# Patient Record
Sex: Female | Born: 1975
Health system: Southern US, Community
[De-identification: ages and names within clinical notes are randomized; demographics above are authoritative.]

## PROBLEM LIST (undated history)

## (undated) DIAGNOSIS — G43909 Migraine, unspecified, not intractable, without status migrainosus: Secondary | ICD-10-CM

## (undated) DIAGNOSIS — K219 Gastro-esophageal reflux disease without esophagitis: Secondary | ICD-10-CM

## (undated) DIAGNOSIS — T8859XA Other complications of anesthesia, initial encounter: Secondary | ICD-10-CM

## (undated) DIAGNOSIS — E079 Disorder of thyroid, unspecified: Secondary | ICD-10-CM

## (undated) DIAGNOSIS — N289 Disorder of kidney and ureter, unspecified: Secondary | ICD-10-CM

## (undated) DIAGNOSIS — M779 Enthesopathy, unspecified: Secondary | ICD-10-CM

## (undated) DIAGNOSIS — D649 Anemia, unspecified: Secondary | ICD-10-CM

## (undated) DIAGNOSIS — T4145XA Adverse effect of unspecified anesthetic, initial encounter: Secondary | ICD-10-CM

## (undated) DIAGNOSIS — F32A Depression, unspecified: Secondary | ICD-10-CM

## (undated) DIAGNOSIS — I10 Essential (primary) hypertension: Secondary | ICD-10-CM

## (undated) DIAGNOSIS — E039 Hypothyroidism, unspecified: Secondary | ICD-10-CM

## (undated) DIAGNOSIS — G4730 Sleep apnea, unspecified: Secondary | ICD-10-CM

## (undated) HISTORY — PX: CHOLECYSTECTOMY: SHX55

## (undated) HISTORY — PX: DILATION AND CURETTAGE OF UTERUS: SHX78

---

## 2007-02-09 ENCOUNTER — Emergency Department (HOSPITAL_COMMUNITY): Admission: EM | Admit: 2007-02-09 | Discharge: 2007-02-09 | Payer: Self-pay | Admitting: Emergency Medicine

## 2007-08-23 ENCOUNTER — Ambulatory Visit (HOSPITAL_COMMUNITY): Admission: RE | Admit: 2007-08-23 | Discharge: 2007-08-23 | Payer: Self-pay | Admitting: Obstetrics and Gynecology

## 2010-08-09 ENCOUNTER — Emergency Department (HOSPITAL_BASED_OUTPATIENT_CLINIC_OR_DEPARTMENT_OTHER)
Admission: EM | Admit: 2010-08-09 | Discharge: 2010-08-09 | Payer: Self-pay | Source: Home / Self Care | Admitting: Emergency Medicine

## 2010-09-08 ENCOUNTER — Emergency Department (HOSPITAL_BASED_OUTPATIENT_CLINIC_OR_DEPARTMENT_OTHER)
Admission: EM | Admit: 2010-09-08 | Discharge: 2010-09-08 | Disposition: A | Discharge status: Home / Self Care | Payer: Medicaid Other | Attending: Emergency Medicine | Admitting: Emergency Medicine

## 2010-09-08 ENCOUNTER — Emergency Department (INDEPENDENT_AMBULATORY_CARE_PROVIDER_SITE_OTHER): Payer: Medicaid Other

## 2010-09-08 DIAGNOSIS — R0602 Shortness of breath: Secondary | ICD-10-CM | POA: Insufficient documentation

## 2010-09-08 DIAGNOSIS — J811 Chronic pulmonary edema: Secondary | ICD-10-CM | POA: Insufficient documentation

## 2010-09-08 DIAGNOSIS — K219 Gastro-esophageal reflux disease without esophagitis: Secondary | ICD-10-CM | POA: Insufficient documentation

## 2010-09-08 DIAGNOSIS — I10 Essential (primary) hypertension: Secondary | ICD-10-CM

## 2010-09-08 DIAGNOSIS — J45909 Unspecified asthma, uncomplicated: Secondary | ICD-10-CM | POA: Insufficient documentation

## 2010-09-08 DIAGNOSIS — R05 Cough: Secondary | ICD-10-CM

## 2010-09-08 LAB — CBC
Hemoglobin: 13.1 g/dL (ref 12.0–15.0)
MCH: 29 pg (ref 26.0–34.0)
RBC: 4.52 MIL/uL (ref 3.87–5.11)

## 2010-09-08 LAB — POCT CARDIAC MARKERS
CKMB, poc: 1.7 ng/mL (ref 1.0–8.0)
Myoglobin, poc: 145 ng/mL (ref 12–200)
Troponin i, poc: <0.05 ng/mL (ref 0.00–0.09)

## 2010-09-08 LAB — DIFFERENTIAL
Basophils Relative: 0 % (ref 0–1)
Monocytes Relative: 4 % (ref 3–12)
Neutro Abs: 5.5 10*3/uL (ref 1.7–7.7)
Neutrophils Relative %: 63 % (ref 43–77)

## 2010-09-08 LAB — BASIC METABOLIC PANEL
CO2: 24 mEq/L (ref 19–32)
Glucose, Bld: 98 mg/dL (ref 70–99)
Potassium: 3.6 mEq/L (ref 3.5–5.1)
Sodium: 148 mEq/L — ABNORMAL HIGH (ref 135–145)

## 2010-09-28 ENCOUNTER — Emergency Department (INDEPENDENT_AMBULATORY_CARE_PROVIDER_SITE_OTHER): Payer: Medicaid Other

## 2010-09-28 ENCOUNTER — Emergency Department (HOSPITAL_BASED_OUTPATIENT_CLINIC_OR_DEPARTMENT_OTHER)
Admission: EM | Admit: 2010-09-28 | Discharge: 2010-09-28 | Disposition: A | Discharge status: Home / Self Care | Payer: Medicaid Other | Attending: Emergency Medicine | Admitting: Emergency Medicine

## 2010-09-28 DIAGNOSIS — J45909 Unspecified asthma, uncomplicated: Secondary | ICD-10-CM | POA: Insufficient documentation

## 2010-09-28 DIAGNOSIS — I10 Essential (primary) hypertension: Secondary | ICD-10-CM | POA: Insufficient documentation

## 2010-09-28 DIAGNOSIS — S139XXA Sprain of joints and ligaments of unspecified parts of neck, initial encounter: Secondary | ICD-10-CM | POA: Insufficient documentation

## 2010-09-28 DIAGNOSIS — M542 Cervicalgia: Secondary | ICD-10-CM

## 2010-09-28 DIAGNOSIS — Z79899 Other long term (current) drug therapy: Secondary | ICD-10-CM | POA: Insufficient documentation

## 2010-09-28 DIAGNOSIS — K219 Gastro-esophageal reflux disease without esophagitis: Secondary | ICD-10-CM | POA: Insufficient documentation

## 2010-09-28 DIAGNOSIS — Y929 Unspecified place or not applicable: Secondary | ICD-10-CM | POA: Insufficient documentation

## 2011-01-24 ENCOUNTER — Emergency Department (HOSPITAL_BASED_OUTPATIENT_CLINIC_OR_DEPARTMENT_OTHER)
Admission: EM | Admit: 2011-01-24 | Discharge: 2011-01-24 | Disposition: A | Discharge status: Home / Self Care | Payer: Medicaid Other | Attending: Emergency Medicine | Admitting: Emergency Medicine

## 2011-01-24 DIAGNOSIS — I10 Essential (primary) hypertension: Secondary | ICD-10-CM | POA: Insufficient documentation

## 2011-01-24 DIAGNOSIS — K219 Gastro-esophageal reflux disease without esophagitis: Secondary | ICD-10-CM | POA: Insufficient documentation

## 2011-01-24 DIAGNOSIS — K644 Residual hemorrhoidal skin tags: Secondary | ICD-10-CM | POA: Insufficient documentation

## 2011-01-24 DIAGNOSIS — J45909 Unspecified asthma, uncomplicated: Secondary | ICD-10-CM | POA: Insufficient documentation

## 2011-05-06 ENCOUNTER — Emergency Department (INDEPENDENT_AMBULATORY_CARE_PROVIDER_SITE_OTHER): Payer: Medicaid Other

## 2011-05-06 ENCOUNTER — Emergency Department (HOSPITAL_BASED_OUTPATIENT_CLINIC_OR_DEPARTMENT_OTHER)
Admission: EM | Admit: 2011-05-06 | Discharge: 2011-05-06 | Disposition: A | Discharge status: Home / Self Care | Payer: Medicaid Other | Attending: Emergency Medicine | Admitting: Emergency Medicine

## 2011-05-06 ENCOUNTER — Encounter: Payer: Self-pay | Admitting: Family Medicine

## 2011-05-06 ENCOUNTER — Other Ambulatory Visit: Payer: Self-pay

## 2011-05-06 ENCOUNTER — Emergency Department (HOSPITAL_BASED_OUTPATIENT_CLINIC_OR_DEPARTMENT_OTHER): Payer: Medicaid Other

## 2011-05-06 DIAGNOSIS — I517 Cardiomegaly: Secondary | ICD-10-CM | POA: Insufficient documentation

## 2011-05-06 DIAGNOSIS — Z79899 Other long term (current) drug therapy: Secondary | ICD-10-CM | POA: Insufficient documentation

## 2011-05-06 DIAGNOSIS — R0602 Shortness of breath: Secondary | ICD-10-CM

## 2011-05-06 DIAGNOSIS — Z8739 Personal history of other diseases of the musculoskeletal system and connective tissue: Secondary | ICD-10-CM | POA: Insufficient documentation

## 2011-05-06 DIAGNOSIS — R05 Cough: Secondary | ICD-10-CM

## 2011-05-06 DIAGNOSIS — J45909 Unspecified asthma, uncomplicated: Secondary | ICD-10-CM | POA: Insufficient documentation

## 2011-05-06 DIAGNOSIS — I10 Essential (primary) hypertension: Secondary | ICD-10-CM | POA: Insufficient documentation

## 2011-05-06 HISTORY — DX: Essential (primary) hypertension: I10

## 2011-05-06 HISTORY — DX: Migraine, unspecified, not intractable, without status migrainosus: G43.909

## 2011-05-06 LAB — DIFFERENTIAL
Basophils Absolute: 0 10*3/uL (ref 0.0–0.1)
Basophils Relative: 0 % (ref 0–1)
Neutro Abs: 6.3 10*3/uL (ref 1.7–7.7)
Neutrophils Relative %: 69 % (ref 43–77)

## 2011-05-06 LAB — CBC
MCHC: 32.4 g/dL (ref 30.0–36.0)
RDW: 14.4 % (ref 11.5–15.5)

## 2011-05-06 LAB — BASIC METABOLIC PANEL
BUN: 25 mg/dL — ABNORMAL HIGH (ref 6–23)
Calcium: 8.9 mg/dL (ref 8.4–10.5)
GFR calc non Af Amer: 29 mL/min — ABNORMAL LOW (ref >90)
Glucose, Bld: 122 mg/dL — ABNORMAL HIGH (ref 70–99)
Potassium: 3.9 mEq/L (ref 3.5–5.1)

## 2011-05-06 LAB — PREGNANCY, URINE: Preg Test, Ur: NEGATIVE

## 2011-05-06 LAB — CARDIAC PANEL(CRET KIN+CKTOT+MB+TROPI)
CK, MB: 2.2 ng/mL (ref 0.3–4.0)
Total CK: 172 U/L (ref 7–177)

## 2011-05-06 MED ORDER — ALBUTEROL SULFATE (5 MG/ML) 0.5% IN NEBU
INHALATION_SOLUTION | RESPIRATORY_TRACT | Status: AC
  Administered 2011-05-06: 2.5 mg via RESPIRATORY_TRACT
  Filled 2011-05-06: qty 0.5

## 2011-05-06 MED ORDER — IPRATROPIUM BROMIDE 0.02 % IN SOLN
0.5000 mg | RESPIRATORY_TRACT | Status: DC
  Administered 2011-05-06: 0.5 mg via RESPIRATORY_TRACT

## 2011-05-06 MED ORDER — IPRATROPIUM BROMIDE 0.02 % IN SOLN
RESPIRATORY_TRACT | Status: AC
  Administered 2011-05-06: 0.5 mg via RESPIRATORY_TRACT
  Filled 2011-05-06: qty 2.5

## 2011-05-06 MED ORDER — HYDRALAZINE HCL 20 MG/ML IJ SOLN
10.0000 mg | Freq: Once | INTRAMUSCULAR | Status: AC
  Administered 2011-05-06: 10 mg via INTRAVENOUS
  Filled 2011-05-06: qty 1

## 2011-05-06 MED ORDER — PREDNISONE 50 MG PO TABS: 50.0000 mg | ORAL_TABLET | Freq: Every day | ORAL | Status: AC

## 2011-05-06 MED ORDER — ALBUTEROL SULFATE (5 MG/ML) 0.5% IN NEBU
2.5000 mg | INHALATION_SOLUTION | RESPIRATORY_TRACT | Status: DC
  Administered 2011-05-06: 2.5 mg via RESPIRATORY_TRACT

## 2011-05-06 MED ORDER — HYDRALAZINE HCL 20 MG/ML IJ SOLN
20.0000 mg | Freq: Once | INTRAMUSCULAR | Status: AC
  Administered 2011-05-06: 20 mg via INTRAVENOUS
  Filled 2011-05-06: qty 1

## 2011-05-06 MED ORDER — AZITHROMYCIN 250 MG PO TABS: 250.0000 mg | ORAL_TABLET | Freq: Every day | ORAL | Status: AC

## 2011-05-06 NOTE — ED Provider Notes (Addendum)
History     CSN: [id number] Arrival date & time: 05/06/2011 10:45 AM  Chief Complaint  Patient presents with  . Shortness of Breath     HPI 35 year old female history of hypertension, asthma presents with shortness of breath palpitations. Patient states that she has experienced shortness of breath with exertion x6 days. Progressively worsening. States that she has been intermittently wheezing throughout the week and using her albuterol inhaler more than usual. Complaint of subjective fever no chills. No URI symptoms. Intermittent nonproductive cough. Denies abdominal pain, nausea, vomiting. She denies chest pain but states that she's had palpitations over the past week as well. No leg swelling or edema. She has a family history of VET in her mother and an aunt. Unknown cause. No history of blood clots in herself. No history of cancer exogenous estrogen use long trips travel hospitalization or surgery. States that she has been compliant with her blood pressure medications except hyzaar which she ran out of 3 days ago. States this is a typical blood pressure for her when she is out of any of her antihypertensives.  Past Medical History  Diagnosis Date  . Arthritis   . Hypertension   . Migraines     Past Surgical History  Procedure Date  . Cesarean section   . Cholecystectomy   . Dilation and curettage of uterus     No family history on file.  History  Substance Use Topics  . Smoking status: Never Smoker   . Smokeless tobacco: Not on file  . Alcohol Use: Yes    OB History    Grav Para Term Preterm Abortions TAB SAB Ect Mult Living                  Review of Systems Negative except as noted in history of present illness  Allergies  Maxalt and Penicillins  Home Medications   Current Outpatient Rx  Name Route Sig Dispense Refill  . ALBUTEROL SULFATE HFA 108 (90 BASE) MCG/ACT IN AERS Inhalation Inhale 2 puffs into the lungs every 6 (six) hours as needed.      Marland Kitchen CLONIDINE  HCL 0.2 MG/24HR TD PTWK Transdermal Place 1 patch onto the skin once a week.      Marland Kitchen CAMBIA PO Oral Take by mouth.      Marland Kitchen LOSARTAN POTASSIUM-HCTZ 100-12.5 MG PO TABS Oral Take 1 tablet by mouth daily.      . NEBIVOLOL HCL 10 MG PO TABS Oral Take 20 mg by mouth daily.        BP 226/128  Pulse 73  Temp(Src) 98.5 F (36.9 C) (Oral)  Resp 18  Ht 5' 6.5" (1.689 m)  Wt 348 lb (157.852 kg)  BMI 55.33 kg/m2  SpO2 100%  LMP 04/25/2011  Physical Exam  Nursing note and vitals reviewed. Constitutional: She is oriented to person, place, and time. She appears well-developed.  HENT:  Head: Atraumatic.  Mouth/Throat: Oropharynx is clear and moist.  Eyes: Conjunctivae and EOM are normal. Pupils are equal, round, and reactive to light.  Neck: Normal range of motion. Neck supple.  Cardiovascular: Normal rate, regular rhythm, normal heart sounds and intact distal pulses.   Pulmonary/Chest: Effort normal and breath sounds normal. No respiratory distress. She has no wheezes. She has no rales.  Abdominal: Soft. She exhibits no distension. There is no tenderness. There is no rebound and no guarding.  Musculoskeletal: Normal range of motion. She exhibits no edema and no tenderness.  Neurological: She is alert and  oriented to person, place, and time.  Skin: Skin is warm and dry. No rash noted.  Psychiatric: She has a normal mood and affect.    ED Course  Procedures (including critical care time)  Labs Reviewed  D-DIMER, QUANTITATIVE - Abnormal; Notable for the following:    D-Dimer, Quant 1.57 (*)    All other components within normal limits  PREGNANCY, URINE  CBC  DIFFERENTIAL  PRO B NATRIURETIC PEPTIDE  CARDIAC PANEL(CRET KIN+CKTOT+MB+TROPI)  BASIC METABOLIC PANEL   Dg Chest 2 View  05/06/2011  *RADIOLOGY REPORT*  Clinical Data: Shortness of breath, cough, history of asthma  CHEST - 2 VIEW  Comparison: Chest x-ray of 09/08/2010  Findings: Moderate cardiomegaly remains.  Minimal fullness of  the perihilar vasculature is present which may indicate fluid overload. No effusion is seen.  No acute bony abnormality.  IMPRESSION: There is cardiomegaly.  Question of mild fluid overload.  Original Report Authenticated By: Joretta Bachelor, M.D.     No diagnosis found.   Date: 05/06/2011  Rate: 69  Rhythm: normal sinus rhythm  QRS Axis: left  Intervals: normal  ST/T Wave abnormalities: nonspecific T wave changes  Conduction Disutrbances:none  Narrative Interpretation:   Old EKG Reviewed: changes noted  MDM  35 year old female history of asthma presents with shortness of breath, palpitations. She does have a family history of VTE. She is not wheezing today and has good air movement but still feels short of breath. We'll check basic labs, BMP, cardiac enzymes given her elevated blood pressure. Check d-dimer. She does not feel like she needs a nebulizer treatment at this time. Reassess. Recheck BP  Hendricks Milo, MD  12:18 PM  Labs reviewed and remarkable for elevated d-dimer. CT PE ordered  12:31 PM  Unable to perform CT 2/2 renal insufficiency 2.1 (last 1.9)  1:12 PM  Discussed with patient. She is feeling better after a nebulizer treatment and feels back to baseline. Discussed transfer for V/Q scan and further w/u for shortness of breath. Pt states she's feeling better. Refusing admit/transfer. Understands that we cannot r/o PE and risks of leaving up to and including death. Will attempt control of blood pressure and reassess. Hydralazine ordered.  2:36 PM  Pt continues to remain asx after one duoneb. States that she feels better. BP slightly improved from previous   BP 181/100  Pulse 77  Temp(Src) 98.5 F (36.9 C) (Oral)  Resp 18  Ht 5' 6.5" (1.689 m)  Wt 348 lb (157.852 kg)  BMI 55.33 kg/m2  SpO2 100%  LMP 04/25/2011  190/97 on repeat and pt states this is her baseline blood pressure.  States she does have refills on her antihypertensives. Will prescribe prednisone and  zpack given subj fever at home. Given strict precautions for return as she is still refusing admit. She is competent and understands the risks. Will not make her sign out AMA today.  Hendricks Milo, MD     Blair Heys, MD 05/06/11 [place], MD 05/06/11 [phone]

## 2011-05-06 NOTE — ED Notes (Signed)
Pt c/o shob with exertion. Pt also c/o non-productive cough x 5 days. Pt speaking in full sentences, nad noted.

## 2011-07-03 ENCOUNTER — Emergency Department (INDEPENDENT_AMBULATORY_CARE_PROVIDER_SITE_OTHER): Payer: Medicaid Other

## 2011-07-03 ENCOUNTER — Emergency Department (HOSPITAL_BASED_OUTPATIENT_CLINIC_OR_DEPARTMENT_OTHER)
Admission: EM | Admit: 2011-07-03 | Discharge: 2011-07-03 | Disposition: A | Discharge status: Home / Self Care | Payer: Medicaid Other | Attending: Emergency Medicine | Admitting: Emergency Medicine

## 2011-07-03 ENCOUNTER — Encounter (HOSPITAL_BASED_OUTPATIENT_CLINIC_OR_DEPARTMENT_OTHER): Payer: Self-pay | Admitting: *Deleted

## 2011-07-03 DIAGNOSIS — R05 Cough: Secondary | ICD-10-CM | POA: Insufficient documentation

## 2011-07-03 DIAGNOSIS — J45909 Unspecified asthma, uncomplicated: Secondary | ICD-10-CM | POA: Insufficient documentation

## 2011-07-03 DIAGNOSIS — R059 Cough, unspecified: Secondary | ICD-10-CM | POA: Insufficient documentation

## 2011-07-03 DIAGNOSIS — H9209 Otalgia, unspecified ear: Secondary | ICD-10-CM | POA: Insufficient documentation

## 2011-07-03 DIAGNOSIS — R0602 Shortness of breath: Secondary | ICD-10-CM

## 2011-07-03 DIAGNOSIS — J3489 Other specified disorders of nose and nasal sinuses: Secondary | ICD-10-CM | POA: Insufficient documentation

## 2011-07-03 DIAGNOSIS — I10 Essential (primary) hypertension: Secondary | ICD-10-CM | POA: Insufficient documentation

## 2011-07-03 DIAGNOSIS — Z79899 Other long term (current) drug therapy: Secondary | ICD-10-CM | POA: Insufficient documentation

## 2011-07-03 DIAGNOSIS — J9819 Other pulmonary collapse: Secondary | ICD-10-CM

## 2011-07-03 DIAGNOSIS — R0989 Other specified symptoms and signs involving the circulatory and respiratory systems: Secondary | ICD-10-CM

## 2011-07-03 HISTORY — DX: Enthesopathy, unspecified: M77.9

## 2011-07-03 MED ORDER — ACETAMINOPHEN 325 MG PO TABS
650.0000 mg | ORAL_TABLET | Freq: Once | ORAL | Status: AC
  Administered 2011-07-03: 650 mg via ORAL
  Filled 2011-07-03: qty 2

## 2011-07-03 MED ORDER — AZITHROMYCIN 250 MG PO TABS: 250.0000 mg | ORAL_TABLET | Freq: Every day | ORAL | Status: AC

## 2011-07-03 MED ORDER — ONDANSETRON 4 MG PO TBDP
4.0000 mg | ORAL_TABLET | Freq: Once | ORAL | Status: AC
  Administered 2011-07-03: 4 mg via ORAL
  Filled 2011-07-03: qty 1

## 2011-07-03 NOTE — ED Notes (Signed)
Pt c/o cough non-productive, congestion and bilateral ear pain since yesterday

## 2011-07-03 NOTE — ED Provider Notes (Signed)
History     CSN: [id number] Arrival date & time: 07/03/2011  7:05 PM   First MD Initiated Contact with Patient 07/03/11 1937      Chief Complaint  Patient presents with  . Cough  . Nasal Congestion  . Otalgia    (Consider location/radiation/quality/duration/timing/severity/associated sxs/prior treatment) Patient is a 35 y.o. female presenting with cough and ear pain. The history is provided by the patient. No language interpreter was used.  Cough This is a new problem. The current episode started 1 to 2 hours ago. The problem occurs constantly. The problem has not changed since onset.The cough is productive of sputum. The maximum temperature recorded prior to her arrival was 102 to 102.9 F. The fever has been present for less than 1 day. Associated symptoms include ear pain. She has tried decongestants for the symptoms. She is not a smoker.  Otalgia Associated symptoms include cough.    Past Medical History  Diagnosis Date  . Hypertension   . Migraines   . Tendonitis   . Asthma     Past Surgical History  Procedure Date  . Cesarean section   . Cholecystectomy   . Dilation and curettage of uterus     History reviewed. No pertinent family history.  History  Substance Use Topics  . Smoking status: Never Smoker   . Smokeless tobacco: Not on file  . Alcohol Use: No    OB History    Grav Para Term Preterm Abortions TAB SAB Ect Mult Living                  Review of Systems  HENT: Positive for ear pain.   Respiratory: Positive for cough.   All other systems reviewed and are negative.    Allergies  Maxalt and Penicillins  Home Medications   Current Outpatient Rx  Name Route Sig Dispense Refill  . ALBUTEROL SULFATE HFA 108 (90 BASE) MCG/ACT IN AERS Inhalation Inhale 2 puffs into the lungs every 6 (six) hours as needed. For shortness of breath    . CLONIDINE HCL 0.2 MG/24HR TD PTWK Transdermal Place 1 patch onto the skin once a week.      Marland Kitchen ADVAIR DISKUS IN  Inhalation Inhale 1 puff into the lungs 2 (two) times daily.      Marland Kitchen LOSARTAN POTASSIUM-HCTZ 100-12.5 MG PO TABS Oral Take 1 tablet by mouth daily.      . NEBIVOLOL HCL 10 MG PO TABS Oral Take 20 mg by mouth daily.      Marland Kitchen OVER THE COUNTER MEDICATION Oral Take 30 mLs by mouth every 4 (four) hours as needed. Children's multi symptom cold medicine       BP 202/91  Pulse 85  Temp(Src) 99.9 F (37.7 C) (Oral)  Resp 19  SpO2 96%  LMP 06/26/2011  Physical Exam  Nursing note and vitals reviewed. Constitutional: She appears well-developed and well-nourished.  HENT:  Head: Normocephalic.  Right Ear: External ear normal.  Left Ear: External ear normal.       rhinnorea  Cardiovascular: Normal rate and regular rhythm.   Pulmonary/Chest: Effort normal and breath sounds normal.    ED Course  Procedures (including critical care time)  Labs Reviewed - No data to display Dg Chest 2 View  07/03/2011  *RADIOLOGY REPORT*  Clinical Data: Shortness of breath and congestion.  CHEST - 2 VIEW  Comparison: Chest x-ray 05/06/2011.  Findings: The heart is enlarged but stable.  There is moderate vascular congestion but no overt  pulmonary edema.  There is streaky bibasilar atelectasis, left greater than right but no definite infiltrates or effusions.  The bony thorax is intact.  IMPRESSION: Streaky bibasilar atelectasis and vascular congestion.  Original Report Authenticated By: P. Kalman Jewels, M.D.     No diagnosis found.    MDM  Pt didn't take her blood pressure medications today and has been taking cold medication discuss with the pt the importance of meds and taking the right cold medication:pt has no overt lower extremity edema        Glendell Docker, NP 07/04/11 0003

## 2011-07-04 NOTE — ED Provider Notes (Signed)
Medical screening examination/treatment/procedure(s) were performed by non-physician practitioner and as supervising physician I was immediately available for consultation/collaboration.  Chauncy Passy, MD 07/04/11 [phone]

## 2012-01-08 ENCOUNTER — Encounter (HOSPITAL_BASED_OUTPATIENT_CLINIC_OR_DEPARTMENT_OTHER): Payer: Self-pay | Admitting: *Deleted

## 2012-01-08 ENCOUNTER — Emergency Department (HOSPITAL_BASED_OUTPATIENT_CLINIC_OR_DEPARTMENT_OTHER): Payer: Medicaid Other

## 2012-01-08 ENCOUNTER — Emergency Department (HOSPITAL_BASED_OUTPATIENT_CLINIC_OR_DEPARTMENT_OTHER)
Admission: EM | Admit: 2012-01-08 | Discharge: 2012-01-08 | Disposition: A | Discharge status: Home / Self Care | Payer: Medicaid Other | Attending: Emergency Medicine | Admitting: Emergency Medicine

## 2012-01-08 DIAGNOSIS — S93401A Sprain of unspecified ligament of right ankle, initial encounter: Secondary | ICD-10-CM

## 2012-01-08 DIAGNOSIS — I10 Essential (primary) hypertension: Secondary | ICD-10-CM | POA: Insufficient documentation

## 2012-01-08 DIAGNOSIS — J45909 Unspecified asthma, uncomplicated: Secondary | ICD-10-CM | POA: Insufficient documentation

## 2012-01-08 DIAGNOSIS — Y92009 Unspecified place in unspecified non-institutional (private) residence as the place of occurrence of the external cause: Secondary | ICD-10-CM | POA: Insufficient documentation

## 2012-01-08 DIAGNOSIS — X500XXA Overexertion from strenuous movement or load, initial encounter: Secondary | ICD-10-CM | POA: Insufficient documentation

## 2012-01-08 DIAGNOSIS — S93409A Sprain of unspecified ligament of unspecified ankle, initial encounter: Secondary | ICD-10-CM | POA: Insufficient documentation

## 2012-01-08 DIAGNOSIS — Z79899 Other long term (current) drug therapy: Secondary | ICD-10-CM | POA: Insufficient documentation

## 2012-01-08 MED ORDER — HYDROCODONE-ACETAMINOPHEN 5-325 MG PO TABS: 2.0000 | ORAL_TABLET | ORAL | Status: DC | PRN

## 2012-01-08 NOTE — Discharge Instructions (Signed)
Ankle Sprain An ankle sprain is an injury to the strong, fibrous tissues (ligaments) that hold the bones of your ankle joint together.  CAUSES Ankle sprain usually is caused by a fall or by twisting your ankle. People who participate in sports are more prone to these types of injuries.  SYMPTOMS  Symptoms of ankle sprain include:  Pain in your ankle. The pain may be present at rest or only when you are trying to stand or walk.   Swelling.   Bruising. Bruising may develop immediately or within 1 to 2 days after your injury.   Difficulty standing or walking.  DIAGNOSIS  Your caregiver will ask you details about your injury and perform a physical exam of your ankle to determine if you have an ankle sprain. During the physical exam, your caregiver will press and squeeze specific areas of your foot and ankle. Your caregiver will try to move your ankle in certain ways. An X-ray exam may be done to be sure a bone was not broken or a ligament did not separate from one of the bones in your ankle (avulsion).  TREATMENT  Certain types of braces can help stabilize your ankle. Your caregiver can make a recommendation for this. Your caregiver may recommend the use of medication for pain. If your sprain is severe, your caregiver may refer you to a surgeon who helps to restore function to parts of your skeletal system (orthopedist) or a physical therapist. HOME CARE INSTRUCTIONS  Apply ice to your injury for 1 to 2 days or as directed by your caregiver. Applying ice helps to reduce inflammation and pain.  Put ice in a plastic bag.   Place a towel between your skin and the bag.   Leave the ice on for 15 to 20 minutes at a time, every 2 hours while you are awake.   Take over-the-counter or prescription medicines for pain, discomfort, or fever only as directed by your caregiver.   Keep your injured leg elevated, when possible, to lessen swelling.   If your caregiver recommends crutches, use them as  instructed. Gradually, put weight on the affected ankle. Continue to use crutches or a cane until you can walk without feeling pain in your ankle.   If you have a plaster splint, wear the splint as directed by your caregiver. Do not rest it on anything harder than a pillow the first 24 hours. Do not put weight on it. Do not get it wet. You may take it off to take a shower or bath.   You may have been given an elastic bandage to wear around your ankle to provide support. If the elastic bandage is too tight (you have numbness or tingling in your foot or your foot becomes cold and blue), adjust the bandage to make it comfortable.   If you have an air splint, you may blow more air into it or let air out to make it more comfortable. You may take your splint off at night and before taking a shower or bath.   Wiggle your toes in the splint several times per day if you are able.  SEEK MEDICAL CARE IF:   You have an increase in bruising, swelling, or pain.   Your toes feel cold.   Pain relief is not achieved with medication.  SEEK IMMEDIATE MEDICAL CARE IF: Your toes are numb or blue or you have severe pain. MAKE SURE YOU:   Understand these instructions.   Will watch your condition.     Will get help right away if you are not doing well or get worse.  Document Released: 07/18/2005 Document Revised: 07/07/2011 Document Reviewed: 02/20/2008 [employer] Patient Information 2012 [employer]. 

## 2012-01-08 NOTE — ED Notes (Signed)
Pt reports that she began having right ankle pain last PM denies known injury pt states that pain goes up into her hip

## 2012-01-08 NOTE — ED Provider Notes (Signed)
History     CSN: [id number]  Arrival date & time 01/08/12  1932   First MD Initiated Contact with Patient 01/08/12 2118      Chief Complaint  Patient presents with  . Ankle Pain    (Consider location/radiation/quality/duration/timing/severity/associated sxs/prior treatment) Patient is a 36 y.o. female presenting with ankle pain. The history is provided by the patient. No language interpreter was used.  Ankle Pain  The incident occurred yesterday. The incident occurred at home. The injury mechanism was torsion. The pain is present in the right ankle. The quality of the pain is described as aching. The pain is at a severity of 4/10. Associated symptoms include inability to bear weight. Pertinent negatives include no numbness. She has tried nothing for the symptoms. The treatment provided moderate relief.   patient complains of soreness to right ankle. Patient reports she slid on the floor at work yesterday patient did not realize she had injured her ankle. Patient complains of swelling and pain she has pain with attempting to walk  Past Medical History  Diagnosis Date  . Hypertension   . Migraines   . Tendonitis   . Asthma     Past Surgical History  Procedure Date  . Cesarean section   . Cholecystectomy   . Dilation and curettage of uterus     History reviewed. No pertinent family history.  History  Substance Use Topics  . Smoking status: Never Smoker   . Smokeless tobacco: Not on file  . Alcohol Use: No    OB History    Grav Para Term Preterm Abortions TAB SAB Ect Mult Living                  Review of Systems  Neurological: Negative for numbness.  All other systems reviewed and are negative.    Allergies  Maxalt and Penicillins  Home Medications   Current Outpatient Rx  Name Route Sig Dispense Refill  . CLONIDINE HCL 0.2 MG/24HR TD PTWK Transdermal Place 1 patch onto the skin once a week.      Marland Kitchen ADVAIR DISKUS IN Inhalation Inhale 1 puff into the lungs 2  (two) times daily.      Marland Kitchen LEVOTHYROXINE SODIUM PO Oral Take by mouth.    . METOPROLOL TARTRATE PO Oral Take by mouth.    . ONE-DAILY MULTI VITAMINS PO TABS Oral Take 1 tablet by mouth daily.    . ALBUTEROL SULFATE HFA 108 (90 BASE) MCG/ACT IN AERS Inhalation Inhale 2 puffs into the lungs every 6 (six) hours as needed. For shortness of breath    . LOSARTAN POTASSIUM-HCTZ 100-12.5 MG PO TABS Oral Take 1 tablet by mouth daily.      . NEBIVOLOL HCL 10 MG PO TABS Oral Take 20 mg by mouth daily.        BP 152/110  Pulse 66  Temp(Src) 98.1 F (36.7 C) (Oral)  Resp 16  SpO2 99%  LMP 01/07/2012  Physical Exam  Nursing note and vitals reviewed. Constitutional: She is oriented to person, place, and time. She appears well-developed and well-nourished.  Musculoskeletal: She exhibits edema and tenderness.       Tender medial and lateral malleolus decreased range of motion neurovascular neurosensory are intact. No gross instability  Neurological: She is alert and oriented to person, place, and time. She has normal reflexes.  Skin: Skin is warm.  Psychiatric: She has a normal mood and affect.    ED Course  Procedures (including critical care time)  Labs Reviewed - No data to display Dg Ankle Complete Right  01/08/2012  *RADIOLOGY REPORT*  Clinical Data: Pain without trauma.  RIGHT ANKLE - COMPLETE 3+ VIEW  Comparison: None.  Findings: Diffuse soft tissue swelling versus patient body habitus. 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 No acute fracture or dislocation. Base of fifth metatarsal and talar dome intact. Intra-articular loose bodies about the ankle anteriorly and posteriorly on the lateral view.  Tiny Achilles spur.  IMPRESSION: Diffuse soft tissue swelling versus patient body habitus.  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  Original Report Authenticated By: Areta Haber, M.D.     1. Right ankle sprain       MDM  Patient placed in an ASO I advised followup  with Dr. Barbaraann Barthel for recheck in one week. Patient is advised to ice and elevate foot she is given a prescription for hydrocodone for pain.        [place], [place] 01/08/12 2127  [place], [place] 01/08/12 2129

## 2012-01-11 NOTE — ED Provider Notes (Signed)
Medical screening examination/treatment/procedure(s) were performed by non-physician practitioner and as supervising physician I was immediately available for consultation/collaboration.  Veryl Speak, MD 01/11/12 1807

## 2012-01-17 ENCOUNTER — Encounter: Payer: Self-pay | Admitting: Family Medicine

## 2012-01-17 ENCOUNTER — Ambulatory Visit (INDEPENDENT_AMBULATORY_CARE_PROVIDER_SITE_OTHER): Payer: Medicaid Other | Admitting: Family Medicine

## 2012-01-17 VITALS — BP 155/98 | HR 64 | Temp 98.1°F | Ht 67.0 in | Wt 396.0 lb

## 2012-01-17 DIAGNOSIS — M25579 Pain in unspecified ankle and joints of unspecified foot: Secondary | ICD-10-CM

## 2012-01-17 DIAGNOSIS — M25571 Pain in right ankle and joints of right foot: Secondary | ICD-10-CM

## 2012-01-17 NOTE — Patient Instructions (Addendum)
You have right ankle arthritis. Continue wearing the ankle brace for support when on your feet a lot. Take tylenol 500mg  1-2 tabs three times a day for pain. Aleve 1-2 tabs twice a day with food Glucosamine sulfate 750mg  twice a day is a supplement that has been shown to help moderate to severe arthritis. Capsaicin topically up to four times a day may also help with pain. Cortisone injections are an option. It's important that you continue to stay active. If you are overweight, try to lose weight through diet and exercise. Consider physical therapy to strengthen muscles around the joint that hurts to take pressure off of the joint itself. Shoe inserts with good arch support may be helpful. Zapanta or cane if needed. Heat or ice 15 minutes at a time 3-4 times a day as needed to help with pain. Water aerobics and cycling with low resistance are the best two types of exercise for arthritis. Follow up with me as needed.

## 2012-01-18 ENCOUNTER — Encounter: Payer: Self-pay | Admitting: Family Medicine

## 2012-01-18 DIAGNOSIS — M25571 Pain in right ankle and joints of right foot: Secondary | ICD-10-CM | POA: Insufficient documentation

## 2012-01-18 NOTE — Progress Notes (Signed)
Subjective:    Patient ID: Dawn Thomas, female    DOB: 12-10-1975, 36 y.o.   MRN: [id number]  PCP: [facility]  HPI 36 yo F here for right ankle pain  Patient reports no known injury. 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 On the 6th or 7th after work had pain across anterior right ankle with some swelling but no bruising. Went to ED - x-rays showed no evidence of fracture - given ASO and vicodin. ASO has helped significatnly. Minimal pain now. No prior ankle injuries that she is aware of.  Past Medical History  Diagnosis Date  . Hypertension   . Migraines   . Tendonitis   . Asthma     Current Outpatient Prescriptions on File Prior to Visit  Medication Sig Dispense Refill  . albuterol (PROVENTIL HFA;VENTOLIN HFA) 108 (90 BASE) MCG/ACT inhaler Inhale 2 puffs into the lungs every 6 (six) hours as needed. For shortness of breath      . cloNIDine (CATAPRES - DOSED IN MG/24 HR) 0.2 mg/24hr patch Place 1 patch onto the skin once a week.        . hydrALAZINE (APRESOLINE) 50 MG tablet       . LEVOTHYROXINE SODIUM PO Take by mouth.      . METOPROLOL TARTRATE PO Take by mouth.      . Fluticasone-Salmeterol (ADVAIR DISKUS IN) Inhale 1 puff into the lungs 2 (two) times daily.        Marland Kitchen losartan-hydrochlorothiazide (HYZAAR) 100-12.5 MG per tablet Take 1 tablet by mouth daily.        . Multiple Vitamin (MULTIVITAMIN) tablet Take 1 tablet by mouth daily.      . nebivolol (BYSTOLIC) 10 MG tablet Take 20 mg by mouth daily.          Past Surgical History  Procedure Date  . Cesarean section   . Cholecystectomy   . Dilation and curettage of uterus     Allergies  Allergen Reactions  . Maxalt (Rizatriptan Benzoate) Shortness Of Breath  . Penicillins Hives    History   Social History  . Marital Status: Single    Spouse Name: N/A    Number of Children: N/A  . Years of Education: N/A   Occupational History  . Not on file.   Social History Main  Topics  . Smoking status: Never Smoker   . Smokeless tobacco: Not on file  . Alcohol Use: No  . Drug Use: No  . Sexually Active: Not Currently    Birth Control/ Protection: Abstinence   Other Topics Concern  . Not on file   Social History Narrative  . No narrative on file    Family History  Problem Relation Age of Onset  . Diabetes Mother   . Hypertension Mother   . Hypertension Father   . Diabetes Father   . Sudden death Father   . Heart attack Neg Hx   . Hyperlipidemia Neg Hx     BP 155/98  Pulse 64  Temp 98.1 F (36.7 C) (Oral)  Ht 5\' 7"  (1.702 m)  Wt 396 lb (179.624 kg)  BMI 62.02 kg/m2  LMP 01/07/2012  Review of Systems See HPI above.    Objective:   Physical Exam Gen: NAD  R ankle: No gross deformity, ecchymoses.  Soft tissue swelling appears equal compared to left. FROM with 5/5 strength all directions without pain. Minimal TTP anterior ankle joint.  No  malleolar, navicular, base 5th, other TTP. Negative ant drawer and talar tilt.   Negative syndesmotic compression. Thompsons test negative. NV intact distally.  L ankle: FROM without pain.    Assessment & Plan:  1. Right ankle pain - Advanced DJD - location of pain, exam, and lack of injury consistent with DJD as cause of her pain.  Discussed medications to take if this flares up again.  Use ASO regularly for support.  See instructions for further.  F/u prn.

## 2012-01-18 NOTE — Assessment & Plan Note (Signed)
Advanced DJD - location of pain, exam, and lack of injury consistent with DJD as cause of her pain.  Discussed medications to take if this flares up again.  Use ASO regularly for support.  See instructions for further.  F/u prn.

## 2012-11-25 ENCOUNTER — Emergency Department (HOSPITAL_BASED_OUTPATIENT_CLINIC_OR_DEPARTMENT_OTHER)
Admission: EM | Admit: 2012-11-25 | Discharge: 2012-11-25 | Disposition: A | Discharge status: Home / Self Care | Payer: BC Managed Care – PPO | Attending: Emergency Medicine | Admitting: Emergency Medicine

## 2012-11-25 ENCOUNTER — Encounter (HOSPITAL_BASED_OUTPATIENT_CLINIC_OR_DEPARTMENT_OTHER): Payer: Self-pay | Admitting: *Deleted

## 2012-11-25 ENCOUNTER — Emergency Department (HOSPITAL_BASED_OUTPATIENT_CLINIC_OR_DEPARTMENT_OTHER): Payer: BC Managed Care – PPO

## 2012-11-25 DIAGNOSIS — R109 Unspecified abdominal pain: Secondary | ICD-10-CM | POA: Insufficient documentation

## 2012-11-25 DIAGNOSIS — Z8739 Personal history of other diseases of the musculoskeletal system and connective tissue: Secondary | ICD-10-CM | POA: Insufficient documentation

## 2012-11-25 DIAGNOSIS — Z8679 Personal history of other diseases of the circulatory system: Secondary | ICD-10-CM | POA: Insufficient documentation

## 2012-11-25 DIAGNOSIS — Z880 Allergy status to penicillin: Secondary | ICD-10-CM | POA: Insufficient documentation

## 2012-11-25 DIAGNOSIS — Z79899 Other long term (current) drug therapy: Secondary | ICD-10-CM | POA: Insufficient documentation

## 2012-11-25 DIAGNOSIS — N289 Disorder of kidney and ureter, unspecified: Secondary | ICD-10-CM | POA: Insufficient documentation

## 2012-11-25 DIAGNOSIS — J45909 Unspecified asthma, uncomplicated: Secondary | ICD-10-CM | POA: Insufficient documentation

## 2012-11-25 DIAGNOSIS — Z3202 Encounter for pregnancy test, result negative: Secondary | ICD-10-CM | POA: Insufficient documentation

## 2012-11-25 DIAGNOSIS — IMO0002 Reserved for concepts with insufficient information to code with codable children: Secondary | ICD-10-CM | POA: Insufficient documentation

## 2012-11-25 LAB — URINALYSIS, ROUTINE W REFLEX MICROSCOPIC
Glucose, UA: NEGATIVE mg/dL
Ketones, ur: NEGATIVE mg/dL
Leukocytes, UA: NEGATIVE
Nitrite: NEGATIVE
Protein, ur: NEGATIVE mg/dL
pH: 5.5 (ref 5.0–8.0)

## 2012-11-25 LAB — COMPREHENSIVE METABOLIC PANEL
ALT: 25 U/L (ref 0–35)
AST: 22 U/L (ref 0–37)
Albumin: 3.5 g/dL (ref 3.5–5.2)
Alkaline Phosphatase: 63 U/L (ref 39–117)
BUN: 25 mg/dL — ABNORMAL HIGH (ref 6–23)
Chloride: 102 mEq/L (ref 96–112)
Potassium: 3.9 mEq/L (ref 3.5–5.1)
Sodium: 139 mEq/L (ref 135–145)
Total Bilirubin: 0.4 mg/dL (ref 0.3–1.2)
Total Protein: 7.6 g/dL (ref 6.0–8.3)

## 2012-11-25 LAB — LIPASE, BLOOD: Lipase: 25 U/L (ref 11–59)

## 2012-11-25 LAB — CBC WITH DIFFERENTIAL/PLATELET
Basophils Absolute: 0 10*3/uL (ref 0.0–0.1)
Basophils Relative: 0 % (ref 0–1)
Eosinophils Absolute: 0.1 10*3/uL (ref 0.0–0.7)
Hemoglobin: 12.7 g/dL (ref 12.0–15.0)
MCH: 29.7 pg (ref 26.0–34.0)
MCHC: 32.6 g/dL (ref 30.0–36.0)
Monocytes Relative: 5 % (ref 3–12)
Neutro Abs: 8.7 10*3/uL — ABNORMAL HIGH (ref 1.7–7.7)
Neutrophils Relative %: 73 % (ref 43–77)
Platelets: 248 10*3/uL (ref 150–400)
RBC: 4.28 MIL/uL (ref 3.87–5.11)

## 2012-11-25 LAB — PREGNANCY, URINE: Preg Test, Ur: NEGATIVE

## 2012-11-25 MED ORDER — IOHEXOL 300 MG/ML  SOLN
50.0000 mL | Freq: Once | INTRAMUSCULAR | Status: AC | PRN
  Administered 2012-11-25: 50 mL via ORAL

## 2012-11-25 MED ORDER — SODIUM CHLORIDE 0.9 % IV BOLUS (SEPSIS)
1000.0000 mL | Freq: Once | INTRAVENOUS | Status: AC
  Administered 2012-11-25: 1000 mL via INTRAVENOUS

## 2012-11-25 MED ORDER — ONDANSETRON HCL 4 MG/2ML IJ SOLN
4.0000 mg | Freq: Once | INTRAMUSCULAR | Status: AC
  Administered 2012-11-25: 4 mg via INTRAVENOUS
  Filled 2012-11-25: qty 2

## 2012-11-25 MED ORDER — HYDROMORPHONE HCL PF 1 MG/ML IJ SOLN
1.0000 mg | Freq: Once | INTRAMUSCULAR | Status: AC
  Administered 2012-11-25: 1 mg via INTRAVENOUS
  Filled 2012-11-25: qty 1

## 2012-11-25 NOTE — ED Notes (Signed)
Pt vomited large amount of clear gastric content with small amount of solid food. MD aware. CT made aware. MD sts do not hold up CT scan d/t vomiting.

## 2012-11-25 NOTE — ED Provider Notes (Signed)
History    This chart was scribed for Shaune Pollack, MD by Adriana Reams, ED Scribe. This patient was seen in room MH10/MH10 and the patient's care was started at [facility].   CSN: [id number]  Arrival date & time 11/25/12  1810   First MD Initiated Contact with Patient 11/25/12 1837      Chief Complaint  Patient presents with  . Abdominal Pain     The history is provided by the patient. No language interpreter was used.   Dawn Thomas is a 37 y.o. female who presents to the Emergency Department complaining of gradual onset, constant lower mid abdominal pain which radiates to both sides and is described as stabbing and began when she woke up this morning. She denies nausea, vomiting, increased frequency, dysuria, vaginal discharge, chills or any other pain. She has been pregnant 3 times in the past and has 1 child. She has not been sexually active in the past year. She gets regular PAP smears. She has tried drinking cranberry juice with no relief. She has not tried any pain medication. She last ate 20 minutes PTA and states she was able to tolerate it.  Her PCP is Dr. Marcello Moores at [place] [facility]. She has had a cholecystectomy . She has a h/o HTN, migraines, a thyroid disease and asthma.   Past Medical History  Diagnosis Date  . Hypertension   . Migraines   . Tendonitis   . Asthma     Past Surgical History  Procedure Laterality Date  . Cesarean section    . Cholecystectomy    . Dilation and curettage of uterus      Family History  Problem Relation Age of Onset  . Diabetes Mother   . Hypertension Mother   . Hypertension Father   . Diabetes Father   . Sudden death Father   . Heart attack Neg Hx   . Hyperlipidemia Neg Hx     History  Substance Use Topics  . Smoking status: Never Smoker   . Smokeless tobacco: Not on file  . Alcohol Use: No    OB History   Grav Para Term Preterm Abortions TAB SAB Ect Mult Living                  Review of Systems  Endocrine:  Negative for polyuria.  Genitourinary: Negative for decreased urine volume and menstrual problem.  All other systems reviewed and are negative.    Allergies  Maxalt and Penicillins  Home Medications   Current Outpatient Rx  Name  Route  Sig  Dispense  Refill  . albuterol (PROVENTIL HFA;VENTOLIN HFA) 108 (90 BASE) MCG/ACT inhaler   Inhalation   Inhale 2 puffs into the lungs every 6 (six) hours as needed. For shortness of breath         . cloNIDine (CATAPRES - DOSED IN MG/24 HR) 0.2 mg/24hr patch   Transdermal   Place 1 patch onto the skin once a week.           . Fluticasone-Salmeterol (ADVAIR DISKUS IN)   Inhalation   Inhale 1 puff into the lungs 2 (two) times daily.           . hydrALAZINE (APRESOLINE) 50 MG tablet               . LEVOTHYROXINE SODIUM PO   Oral   Take by mouth.         . losartan-hydrochlorothiazide (HYZAAR) 100-12.5 MG per tablet   Oral  Take 1 tablet by mouth daily.           Marland Kitchen METOPROLOL TARTRATE PO   Oral   Take by mouth.         . Multiple Vitamin (MULTIVITAMIN) tablet   Oral   Take 1 tablet by mouth daily.         . nebivolol (BYSTOLIC) 10 MG tablet   Oral   Take 20 mg by mouth daily.             BP 137/74  Pulse 78  Temp(Src) 98.5 F (36.9 C) (Oral)  Resp 20  Ht 5' 6.5" (1.689 m)  Wt 385 lb (174.635 kg)  BMI 61.22 kg/m2  SpO2 97%  Physical Exam  Nursing note and vitals reviewed. Constitutional: She is oriented to person, place, and time. She appears well-developed and well-nourished. No distress.  HENT:  Head: Normocephalic and atraumatic.  Mouth/Throat: Oropharynx is clear and moist. No oropharyngeal exudate.  Eyes: Conjunctivae and EOM are normal. Pupils are equal, round, and reactive to light.  Neck: Neck supple. No tracheal deviation present.  Cardiovascular: Normal rate, regular rhythm and normal heart sounds.   Pulmonary/Chest: Effort normal and breath sounds normal. No respiratory distress. She  has no wheezes. She has no rales.  Abdominal: There is tenderness. There is no rebound.  Musculoskeletal: Normal range of motion.  No CVA tenderness.  Neurological: She is alert and oriented to person, place, and time.  Skin: Skin is warm and dry.  No skin lesions.  Psychiatric: She has a normal mood and affect. Her behavior is normal.    ED Course  Procedures (including critical care time) DIAGNOSTIC STUDIES: Oxygen Saturation is 97% on room air, normal by my interpretation.    COORDINATION OF CARE: 7:00 PM Discussed treatment plan which includes labs with pt at bedside and pt agreed to plan.     Labs Reviewed  URINALYSIS, ROUTINE W REFLEX MICROSCOPIC - Abnormal; Notable for the following:    APPearance CLOUDY (*)    All other components within normal limits  PREGNANCY, URINE   No results found.   No diagnosis found.    MDM  Abdominal pain improved.  Discussed renal function patient and she states it is improved.  Encouraged to have close follow up.  Abdomen now nontender on exam.  CT without specific findings.       Shaune Pollack, MD 11/25/12 2228

## 2012-11-25 NOTE — ED Notes (Signed)
Encouraged pt to drink contrast, but slowly to prevent additional vomiting.

## 2012-11-25 NOTE — ED Notes (Addendum)
Pt states she woke up this a.m. With low abd pain. Denies other s/s except being dizzy at times.

## 2013-02-10 ENCOUNTER — Emergency Department (HOSPITAL_BASED_OUTPATIENT_CLINIC_OR_DEPARTMENT_OTHER)
Admission: EM | Admit: 2013-02-10 | Discharge: 2013-02-10 | Disposition: A | Discharge status: Home / Self Care | Payer: BC Managed Care – PPO | Attending: Emergency Medicine | Admitting: Emergency Medicine

## 2013-02-10 ENCOUNTER — Encounter (HOSPITAL_BASED_OUTPATIENT_CLINIC_OR_DEPARTMENT_OTHER): Payer: Self-pay | Admitting: Emergency Medicine

## 2013-02-10 ENCOUNTER — Emergency Department (HOSPITAL_BASED_OUTPATIENT_CLINIC_OR_DEPARTMENT_OTHER): Payer: BC Managed Care – PPO

## 2013-02-10 DIAGNOSIS — Z79899 Other long term (current) drug therapy: Secondary | ICD-10-CM | POA: Insufficient documentation

## 2013-02-10 DIAGNOSIS — J45909 Unspecified asthma, uncomplicated: Secondary | ICD-10-CM | POA: Insufficient documentation

## 2013-02-10 DIAGNOSIS — E7800 Pure hypercholesterolemia, unspecified: Secondary | ICD-10-CM | POA: Insufficient documentation

## 2013-02-10 DIAGNOSIS — Z8739 Personal history of other diseases of the musculoskeletal system and connective tissue: Secondary | ICD-10-CM | POA: Insufficient documentation

## 2013-02-10 DIAGNOSIS — IMO0002 Reserved for concepts with insufficient information to code with codable children: Secondary | ICD-10-CM | POA: Insufficient documentation

## 2013-02-10 DIAGNOSIS — M549 Dorsalgia, unspecified: Secondary | ICD-10-CM

## 2013-02-10 DIAGNOSIS — M545 Low back pain, unspecified: Secondary | ICD-10-CM | POA: Insufficient documentation

## 2013-02-10 DIAGNOSIS — M5431 Sciatica, right side: Secondary | ICD-10-CM

## 2013-02-10 DIAGNOSIS — M546 Pain in thoracic spine: Secondary | ICD-10-CM | POA: Insufficient documentation

## 2013-02-10 DIAGNOSIS — M5430 Sciatica, unspecified side: Secondary | ICD-10-CM | POA: Insufficient documentation

## 2013-02-10 DIAGNOSIS — Z880 Allergy status to penicillin: Secondary | ICD-10-CM | POA: Insufficient documentation

## 2013-02-10 DIAGNOSIS — I10 Essential (primary) hypertension: Secondary | ICD-10-CM | POA: Insufficient documentation

## 2013-02-10 DIAGNOSIS — Z3202 Encounter for pregnancy test, result negative: Secondary | ICD-10-CM | POA: Insufficient documentation

## 2013-02-10 DIAGNOSIS — G43909 Migraine, unspecified, not intractable, without status migrainosus: Secondary | ICD-10-CM | POA: Insufficient documentation

## 2013-02-10 LAB — URINALYSIS, ROUTINE W REFLEX MICROSCOPIC
Bilirubin Urine: NEGATIVE
Glucose, UA: NEGATIVE mg/dL
Ketones, ur: NEGATIVE mg/dL
Leukocytes, UA: NEGATIVE
Nitrite: NEGATIVE
Specific Gravity, Urine: 1.022 (ref 1.005–1.030)
pH: 5.5 (ref 5.0–8.0)

## 2013-02-10 LAB — BASIC METABOLIC PANEL
CO2: 27 mEq/L (ref 19–32)
Chloride: 103 mEq/L (ref 96–112)
Creatinine, Ser: 1.7 mg/dL — ABNORMAL HIGH (ref 0.50–1.10)
GFR calc Af Amer: 43 mL/min — ABNORMAL LOW (ref >90)
Potassium: 3.4 mEq/L — ABNORMAL LOW (ref 3.5–5.1)

## 2013-02-10 LAB — PREGNANCY, URINE: Preg Test, Ur: NEGATIVE

## 2013-02-10 MED ORDER — METHOCARBAMOL 500 MG PO TABS: 500.0000 mg | ORAL_TABLET | Freq: Two times a day (BID) | ORAL | Status: DC

## 2013-02-10 MED ORDER — HYDROCODONE-ACETAMINOPHEN 5-325 MG PO TABS: 2.0000 | ORAL_TABLET | ORAL | Status: DC | PRN

## 2013-02-10 NOTE — ED Notes (Signed)
Pt c/o lower back pain x 4 days; recent fall 1 mo ago; also c/o RT hand intermittent numbness (hx of Raynaud's)

## 2013-02-10 NOTE — ED Notes (Signed)
Patient transported to X-ray 

## 2013-02-10 NOTE — ED Provider Notes (Signed)
History    CSN: [id number] Arrival date & time 02/10/13  1242  First MD Initiated Contact with Patient 02/10/13 1344     Chief Complaint  Patient presents with  . Back Pain   (Consider location/radiation/quality/duration/timing/severity/associated sxs/prior Treatment) Patient is a 37 y.o. female presenting with back pain. The history is provided by the patient. No language interpreter was used.  Back Pain Location:  Lumbar spine and thoracic spine Quality:  Aching Radiates to:  Does not radiate Pain severity:  Moderate Pain is:  Same all the time Onset quality:  Gradual Duration:  4 days Timing:  Constant Progression:  Worsening Chronicity:  New Relieved by:  Nothing Ineffective treatments:  None tried Pt complains of low back pain.   Pt reports pain in low back goes down right leg Past Medical History  Diagnosis Date  . Hypertension   . Migraines   . Tendonitis   . Asthma   . High cholesterol    Past Surgical History  Procedure Laterality Date  . Cesarean section    . Cholecystectomy    . Dilation and curettage of uterus     Family History  Problem Relation Age of Onset  . Diabetes Mother   . Hypertension Mother   . Hypertension Father   . Diabetes Father   . Sudden death Father   . Heart attack Neg Hx   . Hyperlipidemia Neg Hx    History  Substance Use Topics  . Smoking status: Never Smoker   . Smokeless tobacco: Not on file  . Alcohol Use: No   OB History   Grav Para Term Preterm Abortions TAB SAB Ect Mult Living                 Review of Systems  Musculoskeletal: Positive for back pain.  All other systems reviewed and are negative.    Allergies  Maxalt and Penicillins  Home Medications   Current Outpatient Rx  Name  Route  Sig  Dispense  Refill  . rosuvastatin (CRESTOR) 5 MG tablet   Oral   Take 5 mg by mouth daily.         Marland Kitchen albuterol (PROVENTIL HFA;VENTOLIN HFA) 108 (90 BASE) MCG/ACT inhaler   Inhalation   Inhale 2 puffs into  the lungs every 6 (six) hours as needed. For shortness of breath         . cloNIDine (CATAPRES - DOSED IN MG/24 HR) 0.2 mg/24hr patch   Transdermal   Place 1 patch onto the skin once a week.           . Fluticasone-Salmeterol (ADVAIR DISKUS IN)   Inhalation   Inhale 1 puff into the lungs 2 (two) times daily.           . hydrALAZINE (APRESOLINE) 50 MG tablet               . LEVOTHYROXINE SODIUM PO   Oral   Take by mouth.         . losartan-hydrochlorothiazide (HYZAAR) 100-12.5 MG per tablet   Oral   Take 1 tablet by mouth daily.           Marland Kitchen METOPROLOL TARTRATE PO   Oral   Take by mouth.         . Multiple Vitamin (MULTIVITAMIN) tablet   Oral   Take 1 tablet by mouth daily.         . nebivolol (BYSTOLIC) 10 MG tablet   Oral   Take  20 mg by mouth daily.            BP 163/104  Pulse 56  Temp(Src) 97.5 F (36.4 C) (Oral)  Resp 16  Ht 5\' 6"  (1.676 m)  Wt 390 lb (176.903 kg)  BMI 62.98 kg/m2  SpO2 100%  LMP 01/23/2013 Physical Exam  Nursing note and vitals reviewed. Constitutional: She is oriented to person, place, and time. She appears well-developed and well-nourished.  HENT:  Head: Normocephalic and atraumatic.  Eyes: Pupils are equal, round, and reactive to light.  Neck: Normal range of motion.  Cardiovascular: Normal rate and normal heart sounds.   Pulmonary/Chest: Effort normal and breath sounds normal.  Abdominal: Soft.  Musculoskeletal: Normal range of motion.  Neurological: She is alert and oriented to person, place, and time. She has normal reflexes.  Skin: Skin is warm.    ED Course  Procedures (including critical care time) Labs Reviewed  URINALYSIS, ROUTINE W REFLEX MICROSCOPIC - Abnormal; Notable for the following:    APPearance CLOUDY (*)    All other components within normal limits  PREGNANCY, URINE  BASIC METABOLIC PANEL   Dg Lumbar Spine Complete  02/10/2013   *RADIOLOGY REPORT*  Clinical Data: Fall 1 month ago.  Back  pain  LUMBAR SPINE - COMPLETE 4+ VIEW  Comparison:  None.  Findings:  There is no evidence of lumbar spine fracture. Alignment is normal.  Intervertebral disc spaces are maintained.  IMPRESSION: Negative.   Original Report Authenticated By: Carl Best, M.D.   1. Back pain   2. Sciatica, right     MDM  Pt has heart rate of 45 on vital signs.   Date: 02/10/2013  Rate: 45  Rhythm: normal sinus rhythm and sinus bradycardia  QRS Axis: normal  Intervals: normal  ST/T Wave abnormalities: normal  Conduction Disutrbances:none  Narrative Interpretation:   Old EKG Reviewed: unchanged   Fransico Meadow, PA-C 02/10/13 1733

## 2013-02-10 NOTE — ED Provider Notes (Signed)
Medical screening examination/treatment/procedure(s) were performed by non-physician practitioner and as supervising physician I was immediately available for consultation/collaboration.   Ezequiel Essex, MD 02/10/13 [phone]

## 2013-04-20 ENCOUNTER — Encounter (HOSPITAL_BASED_OUTPATIENT_CLINIC_OR_DEPARTMENT_OTHER): Payer: Self-pay | Admitting: Emergency Medicine

## 2013-04-20 ENCOUNTER — Emergency Department (HOSPITAL_BASED_OUTPATIENT_CLINIC_OR_DEPARTMENT_OTHER)
Admission: EM | Admit: 2013-04-20 | Discharge: 2013-04-20 | Disposition: A | Discharge status: Home / Self Care | Payer: BC Managed Care – PPO | Attending: Emergency Medicine | Admitting: Emergency Medicine

## 2013-04-20 DIAGNOSIS — J45909 Unspecified asthma, uncomplicated: Secondary | ICD-10-CM | POA: Insufficient documentation

## 2013-04-20 DIAGNOSIS — J019 Acute sinusitis, unspecified: Secondary | ICD-10-CM

## 2013-04-20 DIAGNOSIS — Z880 Allergy status to penicillin: Secondary | ICD-10-CM | POA: Insufficient documentation

## 2013-04-20 DIAGNOSIS — G43909 Migraine, unspecified, not intractable, without status migrainosus: Secondary | ICD-10-CM | POA: Insufficient documentation

## 2013-04-20 DIAGNOSIS — I10 Essential (primary) hypertension: Secondary | ICD-10-CM | POA: Insufficient documentation

## 2013-04-20 DIAGNOSIS — Z79899 Other long term (current) drug therapy: Secondary | ICD-10-CM | POA: Insufficient documentation

## 2013-04-20 DIAGNOSIS — E7800 Pure hypercholesterolemia, unspecified: Secondary | ICD-10-CM | POA: Insufficient documentation

## 2013-04-20 DIAGNOSIS — Z8739 Personal history of other diseases of the musculoskeletal system and connective tissue: Secondary | ICD-10-CM | POA: Insufficient documentation

## 2013-04-20 DIAGNOSIS — IMO0002 Reserved for concepts with insufficient information to code with codable children: Secondary | ICD-10-CM | POA: Insufficient documentation

## 2013-04-20 MED ORDER — OXYMETAZOLINE HCL 0.05 % NA SOLN: 2.0000 | Freq: Two times a day (BID) | NASAL | Status: DC

## 2013-04-20 MED ORDER — SALINE SPRAY 0.65 % NA SOLN: 1.0000 | NASAL | Status: DC | PRN

## 2013-04-20 MED ORDER — HYDROCODONE-ACETAMINOPHEN 5-325 MG PO TABS: 2.0000 | ORAL_TABLET | Freq: Four times a day (QID) | ORAL | Status: DC | PRN

## 2013-04-20 NOTE — ED Notes (Addendum)
Pt c/o left facial pain no droup or slurried speech not extremity weakness, denies injury or event onset x 3 days nasal congestion x since this AM hx of sinus infections

## 2013-04-20 NOTE — ED Provider Notes (Signed)
CSN: [id number]     Arrival date & time 04/20/13  1939 History  This chart was scribed for Babette Relic, MD by Eugenia Mcalpine, ED Scribe. This patient was seen in room MHH2/MHH2 and the patient's care was started at 10:00 PM.    Chief Complaint  Patient presents with  . Facial Pain    HPI HPI Comments: Dawn Thomas is a 37 y.o. female who presents to the Emergency Department complaining of 3 days of constant gradually worsening sinus congestion and facial pain. Pt denies headache, AMS, vision change, fever, rash, emesis, chest pain, SOB, and abd pain. Pt has taken alka seltzer plus with some relief.  Pt has a hx of asthma and HTN. Pt is a non smoker nondrinker.  Past Medical History  Diagnosis Date  . Hypertension   . Migraines   . Tendonitis   . Asthma   . High cholesterol    Past Surgical History  Procedure Laterality Date  . Cesarean section    . Cholecystectomy    . Dilation and curettage of uterus     Family History  Problem Relation Age of Onset  . Diabetes Mother   . Hypertension Mother   . Hypertension Father   . Diabetes Father   . Sudden death Father   . Heart attack Neg Hx   . Hyperlipidemia Neg Hx    History  Substance Use Topics  . Smoking status: Never Smoker   . Smokeless tobacco: Not on file  . Alcohol Use: No   OB History   Grav Para Term Preterm Abortions TAB SAB Ect Mult Living                 Review of Systems  10 Systems reviewed and are negative for acute change except as noted in the HPI.   Allergies  Maxalt; Adhesive; and Penicillins  Home Medications   Current Outpatient Rx  Name  Route  Sig  Dispense  Refill  . cloNIDine (CATAPRES) 0.1 MG tablet   Oral   Take 0.2 mg by mouth 2 (two) times daily.         Marland Kitchen albuterol (PROVENTIL HFA;VENTOLIN HFA) 108 (90 BASE) MCG/ACT inhaler   Inhalation   Inhale 2 puffs into the lungs every 6 (six) hours as needed. For shortness of breath         . cloNIDine (CATAPRES - DOSED IN MG/24 HR)  0.2 mg/24hr patch   Transdermal   Place 1 patch onto the skin once a week.          . Fluticasone-Salmeterol (ADVAIR DISKUS IN)   Inhalation   Inhale 1 puff into the lungs 2 (two) times daily.           . hydrALAZINE (APRESOLINE) 50 MG tablet               . HYDROcodone-acetaminophen (NORCO) 5-325 MG per tablet   Oral   Take 2 tablets by mouth every 6 (six) hours as needed for pain.   20 tablet   0   . HYDROcodone-acetaminophen (NORCO/VICODIN) 5-325 MG per tablet   Oral   Take 2 tablets by mouth every 4 (four) hours as needed.   20 tablet   0   . LEVOTHYROXINE SODIUM PO   Oral   Take 100 mcg by mouth.          . losartan-hydrochlorothiazide (HYZAAR) 100-12.5 MG per tablet   Oral   Take 1 tablet by mouth daily.           Marland Kitchen  methocarbamol (ROBAXIN) 500 MG tablet   Oral   Take 1 tablet (500 mg total) by mouth 2 (two) times daily.   20 tablet   0   . METOPROLOL TARTRATE PO   Oral   Take by mouth.         . Multiple Vitamin (MULTIVITAMIN) tablet   Oral   Take 1 tablet by mouth daily.         . nebivolol (BYSTOLIC) 10 MG tablet   Oral   Take 20 mg by mouth daily.           Marland Kitchen oxymetazoline (AFRIN NASAL SPRAY) 0.05 % nasal spray   Nasal   Place 2 sprays into the nose 2 (two) times daily. X 3 days only then stop   15 mL   0   . rosuvastatin (CRESTOR) 5 MG tablet   Oral   Take 5 mg by mouth daily.         . sodium chloride (OCEAN) 0.65 % SOLN nasal spray   Nasal   Place 1 spray into the nose as needed for congestion.   1 Bottle   0    BP 142/108  Pulse 69  Temp(Src) 98 F (36.7 C)  Resp 20  Ht 5\' 6"  (1.676 m)  Wt 385 lb (174.635 kg)  BMI 62.17 kg/m2  SpO2 100% Physical Exam  Nursing note and vitals reviewed. Constitutional:  Awake, alert, nontoxic appearance.  HENT:  Head: Atraumatic.  Mouth/Throat: Oropharynx is clear and moist.  Left maxillary facial tenderness without erythema induration or increased warmth, the rest of the  face is non tender  Eyes: Right eye exhibits no discharge. Left eye exhibits no discharge.  Neck: Neck supple.  Cardiovascular: Normal rate, regular rhythm and normal heart sounds.   No murmur heard. Pulmonary/Chest: Effort normal. No respiratory distress. She has no wheezes. She has no rales. She exhibits no tenderness.  Abdominal: Soft. Bowel sounds are normal. There is no tenderness. There is no rebound.  Musculoskeletal: She exhibits no tenderness.  Baseline ROM, no obvious new focal weakness.  Neurological:  Mental status and motor strength appears baseline for patient and situation.  Skin: No rash noted.  Psychiatric: She has a normal mood and affect.    ED Course  Procedures (including critical care tim DIAGNOSTIC STUDIES: Oxygen Saturation is 100% on RA, normal by my interpretation.    COORDINATION OF CARE: 10:08 PM Patient / Family / Caregiver informed of clinical course, including Vicodin, saline spray and decongestant spray, but will not prescribe antibiotics for the likely viral sinus infection. Pt understands medical decision-making process, and agrees with plan.      Labs Review Labs Reviewed - No data to display Imaging Review No results found.  MDM   1. Sinusitis, acute    I doubt any other EMC precluding discharge at this time including, but not necessarily limited to the following:SBI, meninigitis, bacterial sinusitis.  I personally performed the services described in this documentation, which was scribed in my presence. The recorded information has been reviewed and is accurate.    Babette Relic, MD 04/21/13 [phone]

## 2013-04-20 NOTE — ED Notes (Signed)
Pt assessed in waiting area for stroke symptoms no facial droup or difficult speech noted  No extremity weakness

## 2013-06-06 ENCOUNTER — Other Ambulatory Visit: Payer: Self-pay

## 2013-06-06 IMAGING — CR DG CHEST 2V
2 series · 2 of 2 positions shown · non-contrast
Comparison: Chest x-ray 05/06/2011.

CLINICAL DATA: Shortness of breath and congestion.

CHEST - 2 VIEW

[w chest pa]
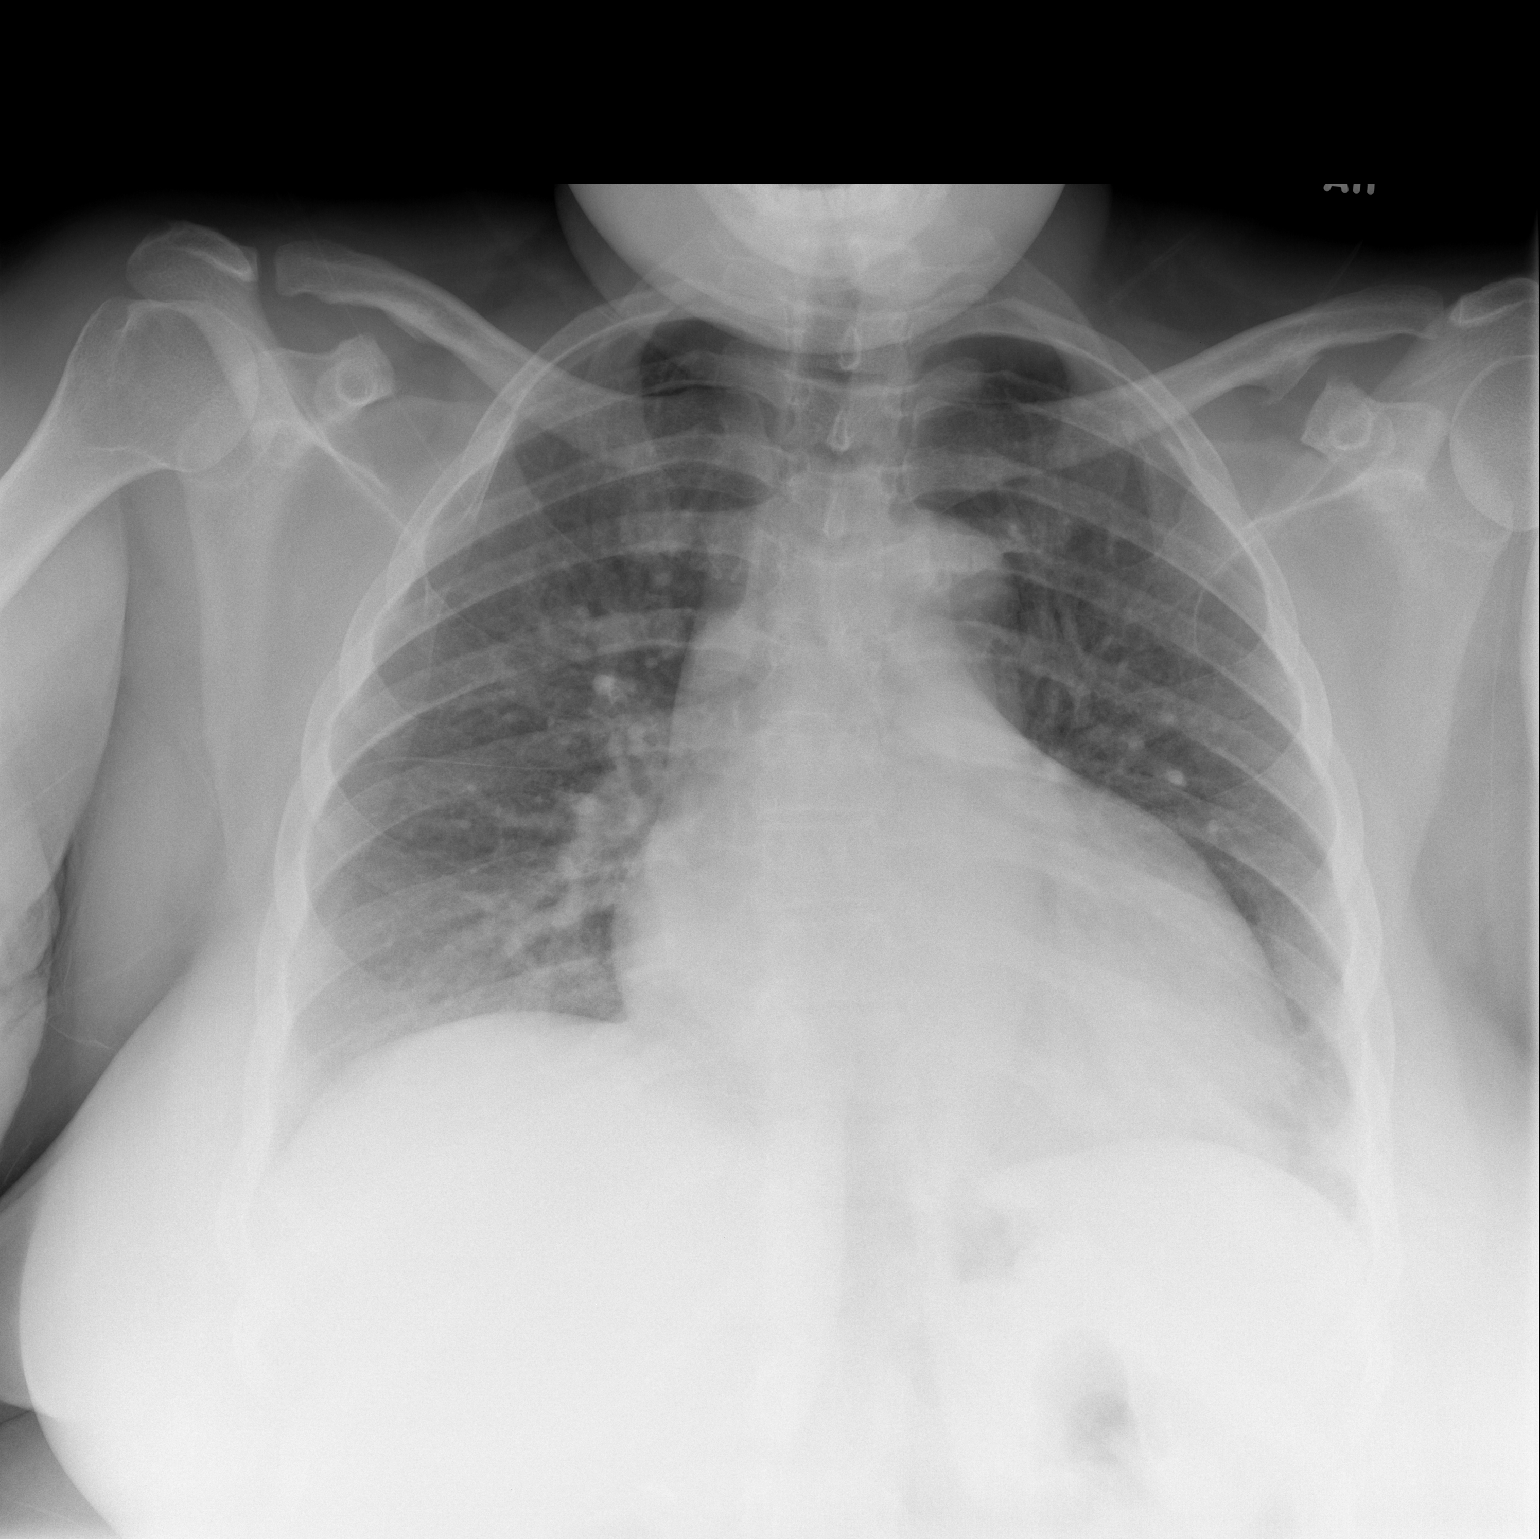

[w chest lat]
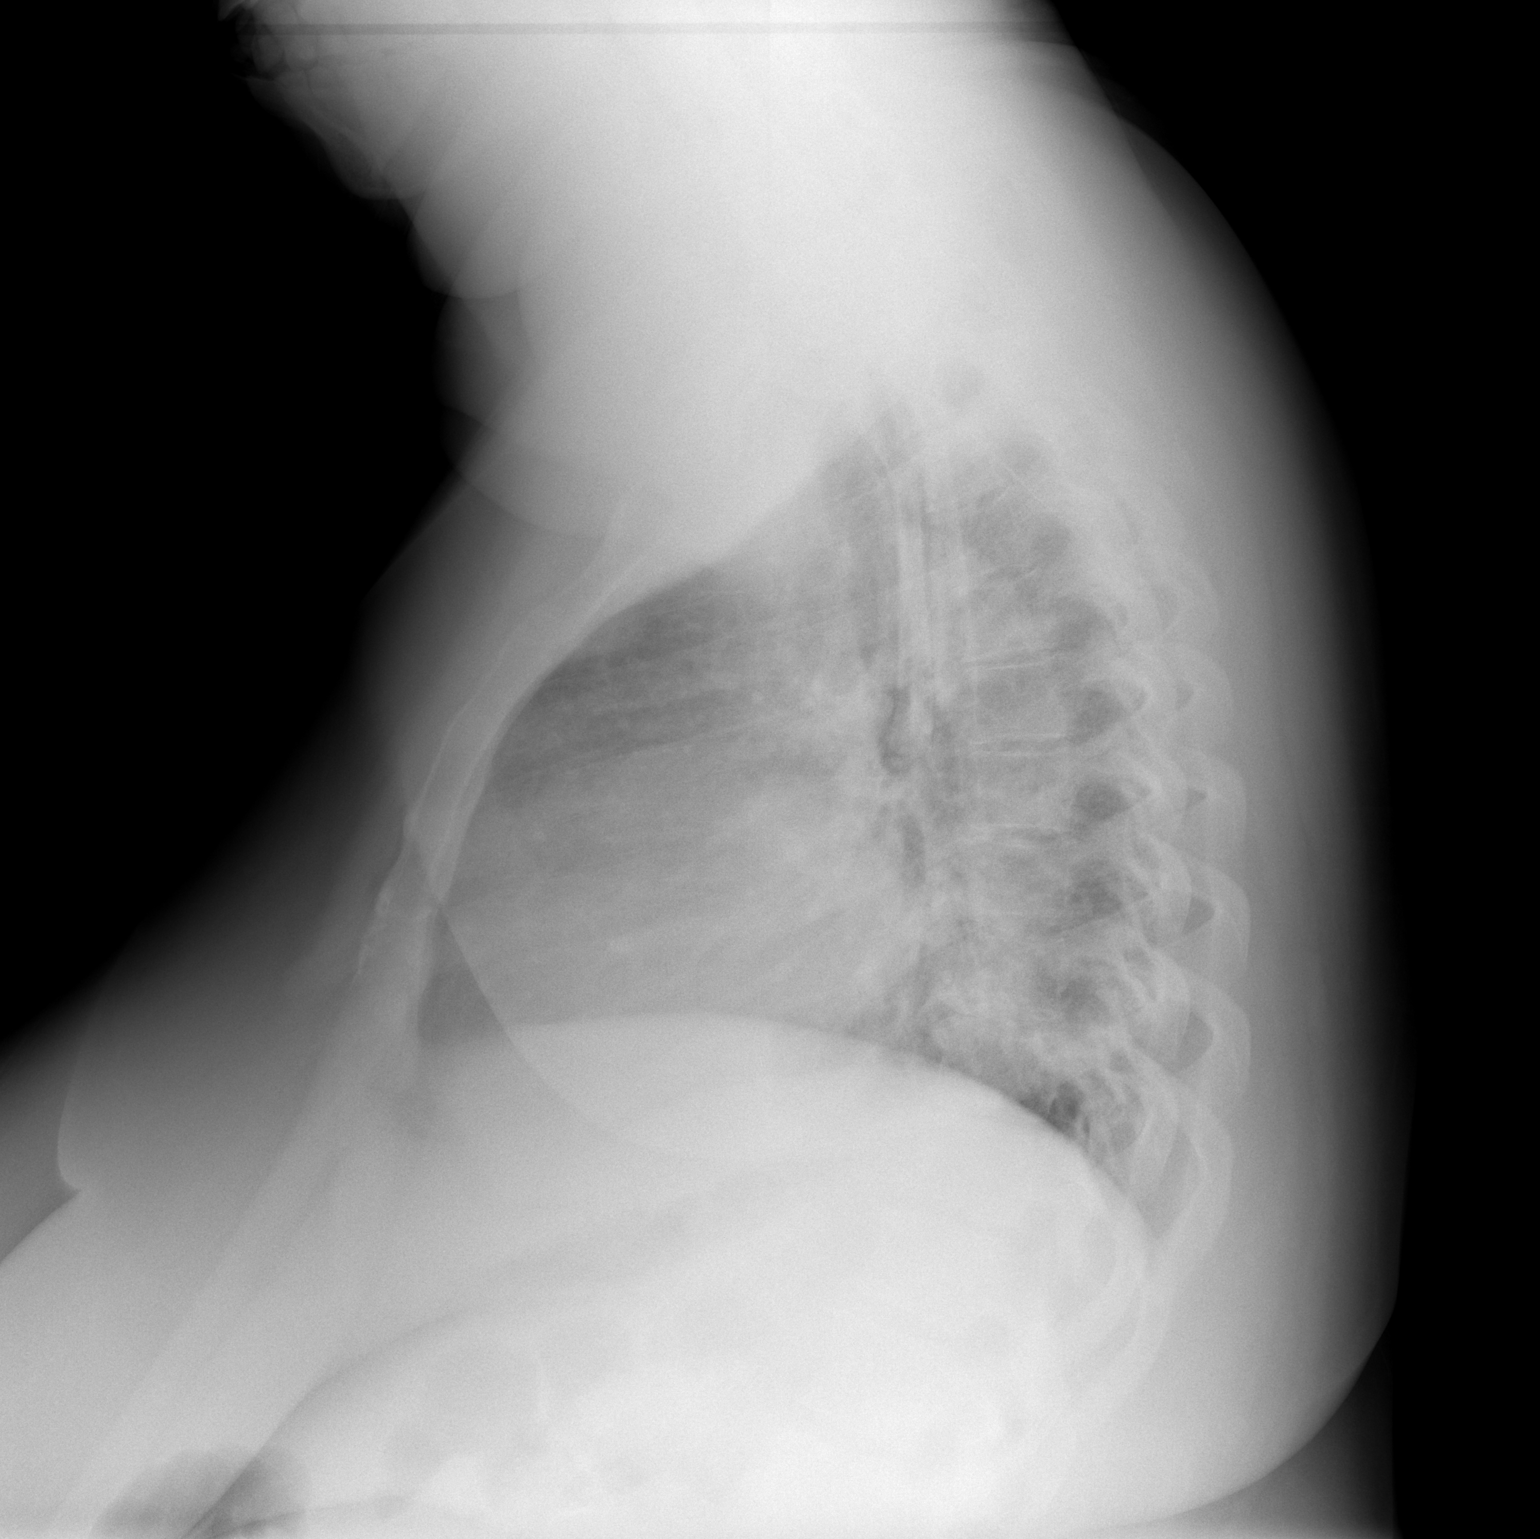

[2 of 2 positions shown; findings below may reference images not displayed]

FINDINGS: The heart is enlarged but stable.  There is moderate
vascular congestion but no overt pulmonary edema.  There is streaky
bibasilar atelectasis, left greater than right but no definite
infiltrates or effusions.  The bony thorax is intact.
IMPRESSION: Streaky bibasilar atelectasis and vascular congestion.

## 2013-06-16 ENCOUNTER — Emergency Department (HOSPITAL_BASED_OUTPATIENT_CLINIC_OR_DEPARTMENT_OTHER)
Admission: EM | Admit: 2013-06-16 | Discharge: 2013-06-17 | Disposition: A | Discharge status: Home / Self Care | Payer: BC Managed Care – PPO | Attending: Emergency Medicine | Admitting: Emergency Medicine

## 2013-06-16 ENCOUNTER — Emergency Department (HOSPITAL_BASED_OUTPATIENT_CLINIC_OR_DEPARTMENT_OTHER): Payer: BC Managed Care – PPO

## 2013-06-16 ENCOUNTER — Encounter (HOSPITAL_BASED_OUTPATIENT_CLINIC_OR_DEPARTMENT_OTHER): Payer: Self-pay | Admitting: Emergency Medicine

## 2013-06-16 DIAGNOSIS — IMO0002 Reserved for concepts with insufficient information to code with codable children: Secondary | ICD-10-CM | POA: Insufficient documentation

## 2013-06-16 DIAGNOSIS — S46912A Strain of unspecified muscle, fascia and tendon at shoulder and upper arm level, left arm, initial encounter: Secondary | ICD-10-CM

## 2013-06-16 DIAGNOSIS — Z79899 Other long term (current) drug therapy: Secondary | ICD-10-CM | POA: Insufficient documentation

## 2013-06-16 DIAGNOSIS — Z8739 Personal history of other diseases of the musculoskeletal system and connective tissue: Secondary | ICD-10-CM | POA: Insufficient documentation

## 2013-06-16 DIAGNOSIS — E079 Disorder of thyroid, unspecified: Secondary | ICD-10-CM | POA: Insufficient documentation

## 2013-06-16 DIAGNOSIS — X58XXXA Exposure to other specified factors, initial encounter: Secondary | ICD-10-CM | POA: Insufficient documentation

## 2013-06-16 DIAGNOSIS — J45909 Unspecified asthma, uncomplicated: Secondary | ICD-10-CM | POA: Insufficient documentation

## 2013-06-16 DIAGNOSIS — E7800 Pure hypercholesterolemia, unspecified: Secondary | ICD-10-CM | POA: Insufficient documentation

## 2013-06-16 DIAGNOSIS — I10 Essential (primary) hypertension: Secondary | ICD-10-CM | POA: Insufficient documentation

## 2013-06-16 DIAGNOSIS — Y939 Activity, unspecified: Secondary | ICD-10-CM | POA: Insufficient documentation

## 2013-06-16 DIAGNOSIS — G43909 Migraine, unspecified, not intractable, without status migrainosus: Secondary | ICD-10-CM | POA: Insufficient documentation

## 2013-06-16 DIAGNOSIS — Z880 Allergy status to penicillin: Secondary | ICD-10-CM | POA: Insufficient documentation

## 2013-06-16 DIAGNOSIS — Y929 Unspecified place or not applicable: Secondary | ICD-10-CM | POA: Insufficient documentation

## 2013-06-16 HISTORY — DX: Disorder of thyroid, unspecified: E07.9

## 2013-06-16 NOTE — ED Notes (Signed)
Pt report onset of Left arm pain 8 days ago progressively worsening, denies injury or event leading to arm pain

## 2013-06-16 NOTE — ED Provider Notes (Signed)
CSN: [id number]     Arrival date & time 06/16/13  2316 History  This chart was scribed for Jaqua Ching Alfonso Patten, MD by Ludger Nutting, ED Scribe. This patient was seen in room MH05/MH05 and the patient's care was started 11:39 PM.    Chief Complaint  Patient presents with  . Arm Pain    Patient is a 37 y.o. female presenting with arm injury. The history is provided by the patient. No language interpreter was used.  Arm Injury Location:  Shoulder Time since incident:  1 week Injury: yes   Mechanism of injury comment:  Does not recall Shoulder location:  L shoulder Pain details:    Quality:  Aching   Radiates to:  L arm   Severity:  Moderate   Onset quality:  Sudden   Duration:  1 week   Timing:  Constant   Progression:  Unchanged Chronicity:  New Dislocation: no   Foreign body present:  No foreign bodies Prior injury to area:  No Relieved by:  Nothing Worsened by:  Movement (palpation) Ineffective treatments:  None tried Associated symptoms: decreased range of motion   Associated symptoms: no back pain, no fever, no neck pain, no numbness, no swelling and no tingling   Risk factors: no concern for non-accidental trauma     HPI Comments: Dawn Thomas is a 37 y.o. female who presents to the Emergency Department complaining of 8 days of gradual onset, gradually worsening, constant left upper arm pain. She denies any falls. She states she may have injured her arm but does not remember the mechanism. Pt states the majority of the pain to the shoulder area. She rates this pain as 7/10 currently. She denies weakness or numbness.   Past Medical History  Diagnosis Date  . Hypertension   . Migraines   . Tendonitis   . Asthma   . High cholesterol   . Thyroid disease    Past Surgical History  Procedure Laterality Date  . Cesarean section    . Cholecystectomy    . Dilation and curettage of uterus     Family History  Problem Relation Age of Onset  . Diabetes Mother   .  Hypertension Mother   . Hypertension Father   . Diabetes Father   . Sudden death Father   . Heart attack Neg Hx   . Hyperlipidemia Neg Hx    History  Substance Use Topics  . Smoking status: Never Smoker   . Smokeless tobacco: Not on file  . Alcohol Use: No   OB History   Grav Para Term Preterm Abortions TAB SAB Ect Mult Living                 Review of Systems  Constitutional: Negative for fever.  Musculoskeletal: Positive for arthralgias (left upper arm pain). Negative for back pain and neck pain.  Neurological: Negative for weakness and numbness.  All other systems reviewed and are negative.    Allergies  Maxalt; Adhesive; and Penicillins  Home Medications   Current Outpatient Rx  Name  Route  Sig  Dispense  Refill  . albuterol (PROVENTIL HFA;VENTOLIN HFA) 108 (90 BASE) MCG/ACT inhaler   Inhalation   Inhale 2 puffs into the lungs every 6 (six) hours as needed. For shortness of breath         . cloNIDine (CATAPRES - DOSED IN MG/24 HR) 0.2 mg/24hr patch   Transdermal   Place 1 patch onto the skin once a week.          Marland Kitchen  cloNIDine (CATAPRES) 0.1 MG tablet   Oral   Take 0.2 mg by mouth 2 (two) times daily.         . Fluticasone-Salmeterol (ADVAIR DISKUS IN)   Inhalation   Inhale 1 puff into the lungs 2 (two) times daily.           . hydrALAZINE (APRESOLINE) 50 MG tablet               . HYDROcodone-acetaminophen (NORCO) 5-325 MG per tablet   Oral   Take 2 tablets by mouth every 6 (six) hours as needed for pain.   20 tablet   0   . HYDROcodone-acetaminophen (NORCO/VICODIN) 5-325 MG per tablet   Oral   Take 2 tablets by mouth every 4 (four) hours as needed.   20 tablet   0   . LEVOTHYROXINE SODIUM PO   Oral   Take 100 mcg by mouth.          . losartan-hydrochlorothiazide (HYZAAR) 100-12.5 MG per tablet   Oral   Take 1 tablet by mouth daily.           . methocarbamol (ROBAXIN) 500 MG tablet   Oral   Take 1 tablet (500 mg total) by  mouth 2 (two) times daily.   20 tablet   0   . METOPROLOL TARTRATE PO   Oral   Take by mouth.         . Multiple Vitamin (MULTIVITAMIN) tablet   Oral   Take 1 tablet by mouth daily.         . nebivolol (BYSTOLIC) 10 MG tablet   Oral   Take 20 mg by mouth daily.           Marland Kitchen oxymetazoline (AFRIN NASAL SPRAY) 0.05 % nasal spray   Nasal   Place 2 sprays into the nose 2 (two) times daily. X 3 days only then stop   15 mL   0   . rosuvastatin (CRESTOR) 5 MG tablet   Oral   Take 5 mg by mouth daily.         . sodium chloride (OCEAN) 0.65 % SOLN nasal spray   Nasal   Place 1 spray into the nose as needed for congestion.   1 Bottle   0    BP 202/100  Pulse 58  Temp(Src) 98.4 F (36.9 C) (Oral)  Resp 18  SpO2 100%  LMP 05/30/2013 Physical Exam  Nursing note and vitals reviewed. Constitutional: She is oriented to person, place, and time. She appears well-developed and well-nourished.  HENT:  Head: Normocephalic and atraumatic.  Eyes: Conjunctivae and EOM are normal.  Neck: Normal range of motion.  Cardiovascular: Normal rate.   Pulmonary/Chest: Effort normal.  Abdominal: Soft. She exhibits no distension.  Musculoskeletal: Normal range of motion. She exhibits no edema.  Left arm is in socket. Intact clavicle. Biceps and triceps tendon and reflexes intact. Rotator cuff is normal.  Cap refill < in all digits. No snuff box tendernes, no step offs, no crepitus, no hematoma. Negative Neer's test.  Neurological: She is alert and oriented to person, place, and time. She has normal reflexes.  Skin: Skin is warm and dry.  Psychiatric: She has a normal mood and affect.    ED Course  Procedures   DIAGNOSTIC STUDIES: Oxygen Saturation is 100% on RA, normal by my interpretation.    COORDINATION OF CARE: 11:40 PM Discussed treatment plan with pt at bedside and pt agreed to plan.  Labs Review Labs Reviewed - No data to display Imaging Review Dg Shoulder  Left  06/17/2013   CLINICAL DATA:  Left arm pain, no trauma.  EXAM: LEFT SHOULDER - 2+ VIEW  COMPARISON:  None available for comparison at time of study interpretation.  FINDINGS: The humeral head is well-formed and located. The subacromial, glenohumeral and acromioclavicular joint spaces are intact. No destructive bony lesions. Soft tissue planes are non-suspicious.  IMPRESSION: No acute fracture deformity nor dislocation.   Electronically Signed   By: Elon Alas   On: 06/17/2013 00:33    EKG Interpretation   None       MDM  No diagnosis found. Shoulder strain I personally performed the services described in this documentation, which was scribed in my presence. The recorded information has been reviewed and is accurate.    Carlisle Beers, MD 06/17/13 [phone]

## 2013-06-17 MED ORDER — MELOXICAM 7.5 MG PO TABS: 7.5000 mg | ORAL_TABLET | Freq: Every day | ORAL | Status: DC

## 2013-07-19 ENCOUNTER — Emergency Department (HOSPITAL_BASED_OUTPATIENT_CLINIC_OR_DEPARTMENT_OTHER)
Admission: EM | Admit: 2013-07-19 | Discharge: 2013-07-19 | Disposition: A | Discharge status: Home / Self Care | Payer: BC Managed Care – PPO | Attending: Emergency Medicine | Admitting: Emergency Medicine

## 2013-07-19 ENCOUNTER — Encounter (HOSPITAL_BASED_OUTPATIENT_CLINIC_OR_DEPARTMENT_OTHER): Payer: Self-pay | Admitting: Emergency Medicine

## 2013-07-19 DIAGNOSIS — I10 Essential (primary) hypertension: Secondary | ICD-10-CM | POA: Insufficient documentation

## 2013-07-19 DIAGNOSIS — G43909 Migraine, unspecified, not intractable, without status migrainosus: Secondary | ICD-10-CM | POA: Insufficient documentation

## 2013-07-19 DIAGNOSIS — Z880 Allergy status to penicillin: Secondary | ICD-10-CM | POA: Insufficient documentation

## 2013-07-19 DIAGNOSIS — Z791 Long term (current) use of non-steroidal anti-inflammatories (NSAID): Secondary | ICD-10-CM | POA: Insufficient documentation

## 2013-07-19 DIAGNOSIS — R112 Nausea with vomiting, unspecified: Secondary | ICD-10-CM | POA: Insufficient documentation

## 2013-07-19 DIAGNOSIS — J45909 Unspecified asthma, uncomplicated: Secondary | ICD-10-CM

## 2013-07-19 DIAGNOSIS — J45901 Unspecified asthma with (acute) exacerbation: Secondary | ICD-10-CM | POA: Insufficient documentation

## 2013-07-19 DIAGNOSIS — IMO0002 Reserved for concepts with insufficient information to code with codable children: Secondary | ICD-10-CM | POA: Insufficient documentation

## 2013-07-19 DIAGNOSIS — Z8739 Personal history of other diseases of the musculoskeletal system and connective tissue: Secondary | ICD-10-CM | POA: Insufficient documentation

## 2013-07-19 DIAGNOSIS — J111 Influenza due to unidentified influenza virus with other respiratory manifestations: Secondary | ICD-10-CM | POA: Insufficient documentation

## 2013-07-19 DIAGNOSIS — R52 Pain, unspecified: Secondary | ICD-10-CM | POA: Insufficient documentation

## 2013-07-19 DIAGNOSIS — E079 Disorder of thyroid, unspecified: Secondary | ICD-10-CM | POA: Insufficient documentation

## 2013-07-19 DIAGNOSIS — Z79899 Other long term (current) drug therapy: Secondary | ICD-10-CM | POA: Insufficient documentation

## 2013-07-19 MED ORDER — PREDNISONE 20 MG PO TABS: ORAL_TABLET | ORAL | Status: DC

## 2013-07-19 MED ORDER — OSELTAMIVIR PHOSPHATE 75 MG PO CAPS: 75.0000 mg | ORAL_CAPSULE | Freq: Two times a day (BID) | ORAL | Status: DC

## 2013-07-19 MED ORDER — ONDANSETRON HCL 4 MG PO TABS: 4.0000 mg | ORAL_TABLET | Freq: Four times a day (QID) | ORAL | Status: DC

## 2013-07-19 NOTE — ED Provider Notes (Signed)
CSN: [id number]     Arrival date & time 07/19/13  [id number] History   First MD Initiated Contact with Patient 07/19/13 1012     Chief Complaint  Patient presents with  . Generalized Body Aches  . Fever   (Consider location/radiation/quality/duration/timing/severity/associated sxs/prior Treatment) HPI Comments: Patient presents to the ER with flulike symptoms which began yesterday. Patient reports that she has had onset of fever, chills, nasal congestion, sore throat, cough, or nausea, vomiting. This began yesterday. No significant abdominal discomfort. She does have a history of asthma, has had increased wheezing.   Past Medical History  Diagnosis Date  . Hypertension   . Migraines   . Tendonitis   . Asthma   . Thyroid disease    Past Surgical History  Procedure Laterality Date  . Cesarean section    . Cholecystectomy    . Dilation and curettage of uterus     Family History  Problem Relation Age of Onset  . Diabetes Mother   . Hypertension Mother   . Hypertension Father   . Diabetes Father   . Sudden death Father   . Heart attack Neg Hx   . Hyperlipidemia Neg Hx    History  Substance Use Topics  . Smoking status: Never Smoker   . Smokeless tobacco: Not on file  . Alcohol Use: No   OB History   Grav Para Term Preterm Abortions TAB SAB Ect Mult Living                 Review of Systems  HENT: Positive for congestion.   Respiratory: Positive for cough and wheezing.   Gastrointestinal: Positive for nausea and vomiting.  Musculoskeletal: Positive for myalgias.  All other systems reviewed and are negative.    Allergies  Maxalt; Adhesive; and Penicillins  Home Medications   Current Outpatient Rx  Name  Route  Sig  Dispense  Refill  . cloNIDine (CATAPRES - DOSED IN MG/24 HR) 0.2 mg/24hr patch   Transdermal   Place 1 patch onto the skin once a week.          . cloNIDine (CATAPRES) 0.1 MG tablet   Oral   Take 0.2 mg by mouth 2 (two) times daily.         .  Fluticasone-Salmeterol (ADVAIR DISKUS IN)   Inhalation   Inhale 1 puff into the lungs 2 (two) times daily.           . hydrALAZINE (APRESOLINE) 50 MG tablet               . LEVOTHYROXINE SODIUM PO   Oral   Take 100 mcg by mouth.          . METOPROLOL TARTRATE PO   Oral   Take by mouth.         . Multiple Vitamin (MULTIVITAMIN) tablet   Oral   Take 1 tablet by mouth daily.         Marland Kitchen albuterol (PROVENTIL HFA;VENTOLIN HFA) 108 (90 BASE) MCG/ACT inhaler   Inhalation   Inhale 2 puffs into the lungs every 6 (six) hours as needed. For shortness of breath         . HYDROcodone-acetaminophen (NORCO) 5-325 MG per tablet   Oral   Take 2 tablets by mouth every 6 (six) hours as needed for pain.   20 tablet   0   . HYDROcodone-acetaminophen (NORCO/VICODIN) 5-325 MG per tablet   Oral   Take 2 tablets by mouth every 4 (four)  hours as needed.   20 tablet   0   . losartan-hydrochlorothiazide (HYZAAR) 100-12.5 MG per tablet   Oral   Take 1 tablet by mouth daily.           . meloxicam (MOBIC) 7.5 MG tablet   Oral   Take 1 tablet (7.5 mg total) by mouth daily.   7 tablet   0   . methocarbamol (ROBAXIN) 500 MG tablet   Oral   Take 1 tablet (500 mg total) by mouth 2 (two) times daily.   20 tablet   0   . nebivolol (BYSTOLIC) 10 MG tablet   Oral   Take 20 mg by mouth daily.           Marland Kitchen oxymetazoline (AFRIN NASAL SPRAY) 0.05 % nasal spray   Nasal   Place 2 sprays into the nose 2 (two) times daily. X 3 days only then stop   15 mL   0   . rosuvastatin (CRESTOR) 5 MG tablet   Oral   Take 5 mg by mouth daily.         . sodium chloride (OCEAN) 0.65 % SOLN nasal spray   Nasal   Place 1 spray into the nose as needed for congestion.   1 Bottle   0    BP 160/97  Pulse 71  Temp(Src) 100.1 F (37.8 C) (Oral)  Resp 20  Ht 5' 6.5" (1.689 m)  Wt 386 lb (175.088 kg)  BMI 61.38 kg/m2  SpO2 96%  LMP 07/01/2013 Physical Exam  Constitutional: She is oriented  to person, place, and time. She appears well-developed and well-nourished. No distress.  HENT:  Head: Normocephalic and atraumatic.  Right Ear: Hearing normal.  Left Ear: Hearing normal.  Nose: Nose normal.  Mouth/Throat: Oropharynx is clear and moist and mucous membranes are normal.  Nasal congestion  Eyes: Conjunctivae and EOM are normal. Pupils are equal, round, and reactive to light.  Neck: Normal range of motion. Neck supple.  Cardiovascular: Regular rhythm, S1 normal and S2 normal.  Exam reveals no gallop and no friction rub.   No murmur heard. Pulmonary/Chest: Effort normal and breath sounds normal. No respiratory distress. She exhibits no tenderness.  Abdominal: Soft. Normal appearance and bowel sounds are normal. There is no hepatosplenomegaly. There is no tenderness. There is no rebound, no guarding, no tenderness at McBurney's point and negative Murphy's sign. No hernia.  Musculoskeletal: Normal range of motion.  Neurological: She is alert and oriented to person, place, and time. She has normal strength. No cranial nerve deficit or sensory deficit. Coordination normal. GCS eye subscore is 4. GCS verbal subscore is 5. GCS motor subscore is 6.  Skin: Skin is warm, dry and intact. No rash noted. No cyanosis.  Psychiatric: She has a normal mood and affect. Her speech is normal and behavior is normal. Thought content normal.    ED Course  Procedures (including critical care time) Labs Review Labs Reviewed - No data to display Imaging Review No results found.  EKG Interpretation   None       MDM  Diagnosis: Flulike syndrome, asthma  Patient presents to ER with complaints of multiple symptoms which are consistent with the flu. Patient also has a history of asthma. She is breathing comfortable, oxygenation is normal. No current wheezing. Patient will be treated symptomatically with Zofran for her nausea. Her asthma will be treated with continued bronchodilators and prednisone.  Patient flulike symptoms will be addressed with Tamiflu.  Orpah Greek, MD 07/19/13 1056

## 2013-07-19 NOTE — ED Notes (Signed)
Flu like symptoms since yesterday. Pt concerned because her BP becomes elevated when sick

## 2013-07-22 ENCOUNTER — Emergency Department (HOSPITAL_BASED_OUTPATIENT_CLINIC_OR_DEPARTMENT_OTHER)
Admission: EM | Admit: 2013-07-22 | Discharge: 2013-07-22 | Disposition: A | Discharge status: Home / Self Care | Payer: BC Managed Care – PPO | Attending: Emergency Medicine | Admitting: Emergency Medicine

## 2013-07-22 ENCOUNTER — Encounter (HOSPITAL_BASED_OUTPATIENT_CLINIC_OR_DEPARTMENT_OTHER): Payer: Self-pay | Admitting: Emergency Medicine

## 2013-07-22 ENCOUNTER — Emergency Department (HOSPITAL_BASED_OUTPATIENT_CLINIC_OR_DEPARTMENT_OTHER): Payer: BC Managed Care – PPO

## 2013-07-22 DIAGNOSIS — Z880 Allergy status to penicillin: Secondary | ICD-10-CM | POA: Insufficient documentation

## 2013-07-22 DIAGNOSIS — J45909 Unspecified asthma, uncomplicated: Secondary | ICD-10-CM | POA: Insufficient documentation

## 2013-07-22 DIAGNOSIS — Z79899 Other long term (current) drug therapy: Secondary | ICD-10-CM | POA: Insufficient documentation

## 2013-07-22 DIAGNOSIS — J189 Pneumonia, unspecified organism: Secondary | ICD-10-CM

## 2013-07-22 DIAGNOSIS — E079 Disorder of thyroid, unspecified: Secondary | ICD-10-CM | POA: Insufficient documentation

## 2013-07-22 DIAGNOSIS — G43909 Migraine, unspecified, not intractable, without status migrainosus: Secondary | ICD-10-CM | POA: Insufficient documentation

## 2013-07-22 DIAGNOSIS — I10 Essential (primary) hypertension: Secondary | ICD-10-CM | POA: Insufficient documentation

## 2013-07-22 DIAGNOSIS — R112 Nausea with vomiting, unspecified: Secondary | ICD-10-CM | POA: Insufficient documentation

## 2013-07-22 DIAGNOSIS — J159 Unspecified bacterial pneumonia: Secondary | ICD-10-CM | POA: Insufficient documentation

## 2013-07-22 MED ORDER — AZITHROMYCIN 250 MG PO TABS: 250.0000 mg | ORAL_TABLET | Freq: Every day | ORAL | Status: DC

## 2013-07-22 NOTE — ED Notes (Signed)
MD at bedside. 

## 2013-07-22 NOTE — ED Provider Notes (Signed)
CSN: [id number]     Arrival date & time 07/22/13  [id number] History   First MD Initiated Contact with Patient 07/22/13 0845     Chief Complaint  Patient presents with  . Hemoptysis   (Consider location/radiation/quality/duration/timing/severity/associated sxs/prior Treatment) Patient is a 37 y.o. female presenting with cough.  Cough Cough characteristics:  Productive Sputum characteristics:  Clear and bloody Severity:  Moderate Onset quality:  Gradual Duration:  5 days Timing:  Intermittent Progression:  Worsening Chronicity:  New Context: upper respiratory infection   Relieved by:  Nothing (has not tried her albuterol, zofran, or tamiflu) Worsened by:  Nothing tried Associated symptoms: shortness of breath and sinus congestion   Associated symptoms: no chest pain and no fever   Associated symptoms comment:  Nausea, vomiting   Past Medical History  Diagnosis Date  . Hypertension   . Migraines   . Tendonitis   . Asthma   . Thyroid disease    Past Surgical History  Procedure Laterality Date  . Cesarean section    . Cholecystectomy    . Dilation and curettage of uterus     Family History  Problem Relation Age of Onset  . Diabetes Mother   . Hypertension Mother   . Hypertension Father   . Diabetes Father   . Sudden death Father   . Heart attack Neg Hx   . Hyperlipidemia Neg Hx    History  Substance Use Topics  . Smoking status: Never Smoker   . Smokeless tobacco: Not on file  . Alcohol Use: No   OB History   Grav Para Term Preterm Abortions TAB SAB Ect Mult Living                 Review of Systems  Constitutional: Negative for fever.  HENT: Negative for congestion.   Respiratory: Positive for cough and shortness of breath.   Cardiovascular: Negative for chest pain.  Gastrointestinal: Negative for nausea, vomiting, abdominal pain and diarrhea.  All other systems reviewed and are negative.    Allergies  Maxalt; Adhesive; and Penicillins  Home  Medications   Current Outpatient Rx  Name  Route  Sig  Dispense  Refill  . albuterol (PROVENTIL HFA;VENTOLIN HFA) 108 (90 BASE) MCG/ACT inhaler   Inhalation   Inhale 2 puffs into the lungs every 6 (six) hours as needed. For shortness of breath         . cloNIDine (CATAPRES - DOSED IN MG/24 HR) 0.2 mg/24hr patch   Transdermal   Place 1 patch onto the skin once a week.          . cloNIDine (CATAPRES) 0.1 MG tablet   Oral   Take 0.2 mg by mouth 2 (two) times daily.         . Fluticasone-Salmeterol (ADVAIR DISKUS IN)   Inhalation   Inhale 1 puff into the lungs 2 (two) times daily.           . hydrALAZINE (APRESOLINE) 50 MG tablet               . HYDROcodone-acetaminophen (NORCO) 5-325 MG per tablet   Oral   Take 2 tablets by mouth every 6 (six) hours as needed for pain.   20 tablet   0   . HYDROcodone-acetaminophen (NORCO/VICODIN) 5-325 MG per tablet   Oral   Take 2 tablets by mouth every 4 (four) hours as needed.   20 tablet   0   . LEVOTHYROXINE SODIUM PO   Oral  Take 100 mcg by mouth.          . losartan-hydrochlorothiazide (HYZAAR) 100-12.5 MG per tablet   Oral   Take 1 tablet by mouth daily.           . meloxicam (MOBIC) 7.5 MG tablet   Oral   Take 1 tablet (7.5 mg total) by mouth daily.   7 tablet   0   . methocarbamol (ROBAXIN) 500 MG tablet   Oral   Take 1 tablet (500 mg total) by mouth 2 (two) times daily.   20 tablet   0   . METOPROLOL TARTRATE PO   Oral   Take by mouth.         . Multiple Vitamin (MULTIVITAMIN) tablet   Oral   Take 1 tablet by mouth daily.         . nebivolol (BYSTOLIC) 10 MG tablet   Oral   Take 20 mg by mouth daily.           . ondansetron (ZOFRAN) 4 MG tablet   Oral   Take 1 tablet (4 mg total) by mouth every 6 (six) hours.   12 tablet   0   . oseltamivir (TAMIFLU) 75 MG capsule   Oral   Take 1 capsule (75 mg total) by mouth every 12 (twelve) hours.   10 capsule   0   . oxymetazoline (AFRIN  NASAL SPRAY) 0.05 % nasal spray   Nasal   Place 2 sprays into the nose 2 (two) times daily. X 3 days only then stop   15 mL   0   . predniSONE (DELTASONE) 20 MG tablet      3 tabs po day one, then 2 tabs daily x 4 days   11 tablet   0   . rosuvastatin (CRESTOR) 5 MG tablet   Oral   Take 5 mg by mouth daily.         . sodium chloride (OCEAN) 0.65 % SOLN nasal spray   Nasal   Place 1 spray into the nose as needed for congestion.   1 Bottle   0    BP 183/90  Pulse 85  Temp(Src) 99.6 F (37.6 C) (Oral)  Resp 22  Ht 5' 6.5" (1.689 m)  Wt 386 lb (175.088 kg)  BMI 61.38 kg/m2  SpO2 95%  LMP 07/22/2013 Physical Exam  Nursing note and vitals reviewed. Constitutional: She is oriented to person, place, and time. She appears well-developed and well-nourished. No distress.  HENT:  Head: Normocephalic and atraumatic.  Mouth/Throat: Oropharynx is clear and moist.  Eyes: Conjunctivae are normal. Pupils are equal, round, and reactive to light. No scleral icterus.  Neck: Neck supple.  Cardiovascular: Normal rate, regular rhythm, normal heart sounds and intact distal pulses.   No murmur heard. Pulmonary/Chest: Effort normal. No stridor. Not tachypneic. No respiratory distress. She has rales in the right lower field.  Abdominal: Soft. Bowel sounds are normal. She exhibits no distension. There is no tenderness.  Musculoskeletal: Normal range of motion.  Neurological: She is alert and oriented to person, place, and time.  Skin: Skin is warm and dry. No rash noted.  Psychiatric: She has a normal mood and affect. Her behavior is normal.    ED Course  Procedures (including critical care time) Labs Review Labs Reviewed - No data to display Imaging Review Dg Chest 2 View  07/22/2013   CLINICAL DATA:  Cough and congestion  EXAM: CHEST  2 VIEW  COMPARISON:  November 17, 2011  FINDINGS: There is patchy infiltrate in the right lower lobe. Lungs are otherwise clear. Heart size and pulmonary  vascularity are normal. No adenopathy. No bone lesions.  IMPRESSION: No patchy infiltrate right lower lobe.   Electronically Signed   By: Lowella Grip M.D.   On: 07/22/2013 09:21  All radiology studies independently viewed by me.     EKG Interpretation   None       MDM   1. Community acquired pneumonia    37 yo female with cough, which has progressed to include about a teaspoon of bloody sputum.  Well appearing, no distress, normal respiratory effort, no wheezing, but does have rales in RLL.  Does have hemoptysis, but overall her HPI is inconsistent with PE, and she is PERC negative.  CXR consistent with pneumonia.  Plan to treat with azithromycin.  Previous EKG shows normal QTc.      Houston Siren, MD 07/22/13 1054

## 2013-07-22 NOTE — ED Notes (Signed)
Pt reports she was seen Thursday and diagnosed with "flu" and was unable to afford Tamiflu.  She reports coughing up bloody sputum.

## 2013-10-04 ENCOUNTER — Emergency Department (HOSPITAL_BASED_OUTPATIENT_CLINIC_OR_DEPARTMENT_OTHER): Payer: No Typology Code available for payment source

## 2013-10-04 ENCOUNTER — Emergency Department (HOSPITAL_BASED_OUTPATIENT_CLINIC_OR_DEPARTMENT_OTHER)
Admission: EM | Admit: 2013-10-04 | Discharge: 2013-10-05 | Disposition: A | Discharge status: Home / Self Care | Payer: No Typology Code available for payment source | Attending: Emergency Medicine | Admitting: Emergency Medicine

## 2013-10-04 ENCOUNTER — Encounter (HOSPITAL_BASED_OUTPATIENT_CLINIC_OR_DEPARTMENT_OTHER): Payer: Self-pay | Admitting: Emergency Medicine

## 2013-10-04 DIAGNOSIS — IMO0002 Reserved for concepts with insufficient information to code with codable children: Secondary | ICD-10-CM | POA: Insufficient documentation

## 2013-10-04 DIAGNOSIS — Z880 Allergy status to penicillin: Secondary | ICD-10-CM | POA: Insufficient documentation

## 2013-10-04 DIAGNOSIS — R109 Unspecified abdominal pain: Secondary | ICD-10-CM

## 2013-10-04 DIAGNOSIS — Y92410 Unspecified street and highway as the place of occurrence of the external cause: Secondary | ICD-10-CM | POA: Insufficient documentation

## 2013-10-04 DIAGNOSIS — I129 Hypertensive chronic kidney disease with stage 1 through stage 4 chronic kidney disease, or unspecified chronic kidney disease: Secondary | ICD-10-CM | POA: Insufficient documentation

## 2013-10-04 DIAGNOSIS — N183 Chronic kidney disease, stage 3 unspecified: Secondary | ICD-10-CM | POA: Insufficient documentation

## 2013-10-04 DIAGNOSIS — S298XXA Other specified injuries of thorax, initial encounter: Secondary | ICD-10-CM | POA: Insufficient documentation

## 2013-10-04 DIAGNOSIS — W2210XA Striking against or struck by unspecified automobile airbag, initial encounter: Secondary | ICD-10-CM

## 2013-10-04 DIAGNOSIS — Z23 Encounter for immunization: Secondary | ICD-10-CM | POA: Insufficient documentation

## 2013-10-04 DIAGNOSIS — S80219A Abrasion, unspecified knee, initial encounter: Secondary | ICD-10-CM

## 2013-10-04 DIAGNOSIS — Z9889 Other specified postprocedural states: Secondary | ICD-10-CM | POA: Insufficient documentation

## 2013-10-04 DIAGNOSIS — Z79899 Other long term (current) drug therapy: Secondary | ICD-10-CM | POA: Insufficient documentation

## 2013-10-04 DIAGNOSIS — E079 Disorder of thyroid, unspecified: Secondary | ICD-10-CM | POA: Insufficient documentation

## 2013-10-04 DIAGNOSIS — S3981XA Other specified injuries of abdomen, initial encounter: Secondary | ICD-10-CM | POA: Insufficient documentation

## 2013-10-04 DIAGNOSIS — J45909 Unspecified asthma, uncomplicated: Secondary | ICD-10-CM | POA: Insufficient documentation

## 2013-10-04 DIAGNOSIS — S46911A Strain of unspecified muscle, fascia and tendon at shoulder and upper arm level, right arm, initial encounter: Secondary | ICD-10-CM

## 2013-10-04 DIAGNOSIS — Z3202 Encounter for pregnancy test, result negative: Secondary | ICD-10-CM | POA: Insufficient documentation

## 2013-10-04 DIAGNOSIS — Z9089 Acquired absence of other organs: Secondary | ICD-10-CM | POA: Insufficient documentation

## 2013-10-04 DIAGNOSIS — Y9389 Activity, other specified: Secondary | ICD-10-CM | POA: Insufficient documentation

## 2013-10-04 DIAGNOSIS — R0789 Other chest pain: Secondary | ICD-10-CM

## 2013-10-04 HISTORY — DX: Disorder of kidney and ureter, unspecified: N28.9

## 2013-10-04 LAB — CBC WITH DIFFERENTIAL/PLATELET
BASOS PCT: 0 % (ref 0–1)
Basophils Absolute: 0 10*3/uL (ref 0.0–0.1)
EOS ABS: 0.1 10*3/uL (ref 0.0–0.7)
Eosinophils Relative: 1 % (ref 0–5)
HCT: 42.1 % (ref 36.0–46.0)
Hemoglobin: 13.5 g/dL (ref 12.0–15.0)
Lymphocytes Relative: 23 % (ref 12–46)
Lymphs Abs: 2.2 10*3/uL (ref 0.7–4.0)
MCH: 29 pg (ref 26.0–34.0)
MCHC: 32.1 g/dL (ref 30.0–36.0)
MCV: 90.5 fL (ref 78.0–100.0)
Monocytes Absolute: 0.4 10*3/uL (ref 0.1–1.0)
Monocytes Relative: 4 % (ref 3–12)
NEUTROS PCT: 72 % (ref 43–77)
Neutro Abs: 6.9 10*3/uL (ref 1.7–7.7)
PLATELETS: 235 10*3/uL (ref 150–400)
RBC: 4.65 MIL/uL (ref 3.87–5.11)
RDW: 15.2 % (ref 11.5–15.5)
WBC: 9.6 10*3/uL (ref 4.0–10.5)

## 2013-10-04 LAB — URINALYSIS, ROUTINE W REFLEX MICROSCOPIC
Bilirubin Urine: NEGATIVE
Glucose, UA: NEGATIVE mg/dL
HGB URINE DIPSTICK: NEGATIVE
KETONES UR: NEGATIVE mg/dL
LEUKOCYTES UA: NEGATIVE
Nitrite: NEGATIVE
PROTEIN: 100 mg/dL — AB
Specific Gravity, Urine: 1.015 (ref 1.005–1.030)
UROBILINOGEN UA: 1 mg/dL (ref 0.0–1.0)
pH: 6.5 (ref 5.0–8.0)

## 2013-10-04 LAB — URINE MICROSCOPIC-ADD ON

## 2013-10-04 LAB — BASIC METABOLIC PANEL
BUN: 29 mg/dL — ABNORMAL HIGH (ref 6–23)
CO2: 26 mEq/L (ref 19–32)
Calcium: 9.8 mg/dL (ref 8.4–10.5)
Chloride: 107 mEq/L (ref 96–112)
Creatinine, Ser: 2 mg/dL — ABNORMAL HIGH (ref 0.50–1.10)
GFR, EST AFRICAN AMERICAN: 36 mL/min — AB (ref >90)
GFR, EST NON AFRICAN AMERICAN: 31 mL/min — AB (ref >90)
Glucose, Bld: 104 mg/dL — ABNORMAL HIGH (ref 70–99)
POTASSIUM: 4.9 meq/L (ref 3.7–5.3)
SODIUM: 148 meq/L — AB (ref 137–147)

## 2013-10-04 LAB — PREGNANCY, URINE: PREG TEST UR: NEGATIVE

## 2013-10-04 LAB — LIPASE, BLOOD: Lipase: 159 U/L — ABNORMAL HIGH (ref 11–59)

## 2013-10-04 MED ORDER — CYCLOBENZAPRINE HCL 10 MG PO TABS: 10.0000 mg | ORAL_TABLET | Freq: Two times a day (BID) | ORAL | Status: DC | PRN

## 2013-10-04 MED ORDER — HYDROMORPHONE HCL PF 1 MG/ML IJ SOLN
1.0000 mg | Freq: Once | INTRAMUSCULAR | Status: AC
  Administered 2013-10-04: 1 mg via INTRAVENOUS
  Filled 2013-10-04: qty 1

## 2013-10-04 MED ORDER — SODIUM CHLORIDE 0.9 % IV BOLUS (SEPSIS)
1000.0000 mL | Freq: Once | INTRAVENOUS | Status: AC
  Administered 2013-10-04: 1000 mL via INTRAVENOUS

## 2013-10-04 MED ORDER — TETANUS-DIPHTH-ACELL PERTUSSIS 5-2.5-18.5 LF-MCG/0.5 IM SUSP
0.5000 mL | Freq: Once | INTRAMUSCULAR | Status: AC
  Administered 2013-10-04: 0.5 mL via INTRAMUSCULAR
  Filled 2013-10-04: qty 0.5

## 2013-10-04 MED ORDER — HYDROCODONE-ACETAMINOPHEN 5-325 MG PO TABS: 1.0000 | ORAL_TABLET | ORAL | Status: DC | PRN

## 2013-10-04 MED ORDER — ONDANSETRON HCL 4 MG/2ML IJ SOLN
4.0000 mg | Freq: Once | INTRAMUSCULAR | Status: AC
  Administered 2013-10-04: 4 mg via INTRAVENOUS
  Filled 2013-10-04: qty 2

## 2013-10-04 NOTE — Discharge Instructions (Signed)
Abdominal Pain, Adult Many things can cause abdominal pain. Usually, abdominal pain is not caused by a disease and will improve without treatment. It can often be observed and treated at home. Your health care provider will do a physical exam and possibly order blood tests and X-rays to help determine the seriousness of your pain. However, in many cases, more time must pass before a clear cause of the pain can be found. Before that point, your health care provider may not know if you need more testing or further treatment. HOME CARE INSTRUCTIONS  Monitor your abdominal pain for any changes. The following actions may help to alleviate any discomfort you are experiencing:  Only take over-the-counter or prescription medicines as directed by your health care provider.  Do not take laxatives unless directed to do so by your health care provider.  Try a clear liquid diet (broth, tea, or water) as directed by your health care provider. Slowly move to a bland diet as tolerated. SEEK MEDICAL CARE IF:  You have unexplained abdominal pain.  You have abdominal pain associated with nausea or diarrhea.  You have pain when you urinate or have a bowel movement.  You experience abdominal pain that wakes you in the night.  You have abdominal pain that is worsened or improved by eating food.  You have abdominal pain that is worsened with eating fatty foods. SEEK IMMEDIATE MEDICAL CARE IF:   Your pain does not go away within 2 hours.  You have a fever.  You keep throwing up (vomiting).  Your pain is felt only in portions of the abdomen, such as the right side or the left lower portion of the abdomen.  You pass bloody or black tarry stools. MAKE SURE YOU:  Understand these instructions.   Will watch your condition.   Will get help right away if you are not doing well or get worse.  Document Released: 04/27/2005 Document Revised: 05/08/2013 Document Reviewed: 03/27/2013 [facility] Patient  Information 2014 [place].  Abrasion An abrasion is a cut or scrape of the skin. Abrasions do not extend through all layers of the skin and most heal within 10 days. It is important to care for your abrasion properly to prevent infection. CAUSES  Most abrasions are caused by falling on, or gliding across, the ground or other surface. When your skin rubs on something, the outer and inner layer of skin rubs off, causing an abrasion. DIAGNOSIS  Your caregiver will be able to diagnose an abrasion during a physical exam.  TREATMENT  Your treatment depends on how large and deep the abrasion is. Generally, your abrasion will be cleaned with water and a mild soap to remove any dirt or debris. An antibiotic ointment may be put over the abrasion to prevent an infection. A bandage (dressing) may be wrapped around the abrasion to keep it from getting dirty.  You may need a tetanus shot if:  You cannot remember when you had your last tetanus shot.  You have never had a tetanus shot.  The injury broke your skin. If you get a tetanus shot, your arm may swell, get red, and feel warm to the touch. This is common and not a problem. If you need a tetanus shot and you choose not to have one, there is a rare chance of getting tetanus. Sickness from tetanus can be serious.  HOME CARE INSTRUCTIONS   If a dressing was applied, change it at least once a day or as directed by your  caregiver. If the bandage sticks, soak it off with warm water.   Wash the area with water and a mild soap to remove all the ointment 2 times a day. Rinse off the soap and pat the area dry with a clean towel.   Reapply any ointment as directed by your caregiver. This will help prevent infection and keep the bandage from sticking. Use gauze over the wound and under the dressing to help keep the bandage from sticking.   Change your dressing right away if it becomes wet or dirty.   Only take over-the-counter or prescription  medicines for pain, discomfort, or fever as directed by your caregiver.   Follow up with your caregiver within 24 48 hours for a wound check, or as directed. If you were not given a wound-check appointment, look closely at your abrasion for redness, swelling, or pus. These are signs of infection. SEEK IMMEDIATE MEDICAL CARE IF:   You have increasing pain in the wound.   You have redness, swelling, or tenderness around the wound.   You have pus coming from the wound.   You have a fever or persistent symptoms for more than 2 3 days.  You have a fever and your symptoms suddenly get worse.  You have a bad smell coming from the wound or dressing.  MAKE SURE YOU:   Understand these instructions.  Will watch your condition.  Will get help right away if you are not doing well or get worse. Document Released: 04/27/2005 Document Revised: 07/04/2012 Document Reviewed: 06/21/2011 [facility] Patient Information 2014 [place], [place].  Chest Wall Pain Chest wall pain is pain in or around the bones and muscles of your chest. It may take up to 6 weeks to get better. It may take longer if you must stay physically active in your work and activities.  CAUSES  Chest wall pain may happen on its own. However, it may be caused by:  A viral illness like the flu.  Injury.  Coughing.  Exercise.  Arthritis.  Fibromyalgia.  Shingles. HOME CARE INSTRUCTIONS   Avoid overtiring physical activity. Try not to strain or perform activities that cause pain. This includes any activities using your chest or your abdominal and side muscles, especially if heavy weights are used.  Put ice on the sore area.  Put ice in a plastic bag.  Place a towel between your skin and the bag.  Leave the ice on for 15-20 minutes per hour while awake for the first 2 days.  Only take over-the-counter or prescription medicines for pain, discomfort, or fever as directed by your caregiver. SEEK IMMEDIATE MEDICAL CARE  IF:   Your pain increases, or you are very uncomfortable.  You have a fever.  Your chest pain becomes worse.  You have new, unexplained symptoms.  You have nausea or vomiting.  You feel sweaty or lightheaded.  You have a cough with phlegm (sputum), or you cough up blood. MAKE SURE YOU:   Understand these instructions.  Will watch your condition.  Will get help right away if you are not doing well or get worse. Document Released: 07/18/2005 Document Revised: 10/10/2011 Document Reviewed: 03/14/2011 [facility] Patient Information 2014 [place], [place].  Musculoskeletal Pain Musculoskeletal pain is muscle and boney aches and pains. These pains can occur in any part of the body. Your caregiver may treat you without knowing the cause of the pain. They may treat you if blood or urine tests, X-rays, and other tests were normal.  CAUSES There is  often not a definite cause or reason for these pains. These pains may be caused by a type of germ (virus). The discomfort may also come from overuse. Overuse includes working out too hard when your body is not fit. Boney aches also come from weather changes. Bone is sensitive to atmospheric pressure changes. HOME CARE INSTRUCTIONS   Ask when your test results will be ready. Make sure you get your test results.  Only take over-the-counter or prescription medicines for pain, discomfort, or fever as directed by your caregiver. If you were given medications for your condition, do not drive, operate machinery or power tools, or sign legal documents for 24 hours. Do not drink alcohol. Do not take sleeping pills or other medications that may interfere with treatment.  Continue all activities unless the activities cause more pain. When the pain lessens, slowly resume normal activities. Gradually increase the intensity and duration of the activities or exercise.  During periods of severe pain, bed rest may be helpful. Lay or sit in any position that is  comfortable.  Putting ice on the injured area.  Put ice in a bag.  Place a towel between your skin and the bag.  Leave the ice on for 15 to 20 minutes, 3 to 4 times a day.  Follow up with your caregiver for continued problems and no reason can be found for the pain. If the pain becomes worse or does not go away, it may be necessary to repeat tests or do additional testing. Your caregiver may need to look further for a possible cause. SEEK IMMEDIATE MEDICAL CARE IF:  You have pain that is getting worse and is not relieved by medications.  You develop chest pain that is associated with shortness or breath, sweating, feeling sick to your stomach (nauseous), or throw up (vomit).  Your pain becomes localized to the abdomen.  You develop any new symptoms that seem different or that concern you. MAKE SURE YOU:   Understand these instructions.  Will watch your condition.  Will get help right away if you are not doing well or get worse. Document Released: 07/18/2005 Document Revised: 10/10/2011 Document Reviewed: 03/22/2013 [facility] Patient Information 2014 [place].
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
## 2013-10-05 NOTE — ED Notes (Signed)
EDP notified of BP = pt has taken her metoprolol and clonidine from home = EDP aware

## 2013-10-05 NOTE — ED Provider Notes (Signed)
Medical screening examination/treatment/procedure(s) were conducted as a shared visit with non-physician practitioner(s) and myself.  I personally evaluated the patient during the encounter.   EKG Interpretation None        Houston Siren III, MD 10/05/13 1650
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
## 2014-06-25 ENCOUNTER — Emergency Department (HOSPITAL_BASED_OUTPATIENT_CLINIC_OR_DEPARTMENT_OTHER): Payer: Medicaid Other

## 2014-06-25 ENCOUNTER — Encounter (HOSPITAL_BASED_OUTPATIENT_CLINIC_OR_DEPARTMENT_OTHER): Payer: Self-pay | Admitting: Emergency Medicine

## 2014-06-25 ENCOUNTER — Emergency Department (HOSPITAL_BASED_OUTPATIENT_CLINIC_OR_DEPARTMENT_OTHER)
Admission: EM | Admit: 2014-06-25 | Discharge: 2014-06-25 | Disposition: A | Discharge status: Home / Self Care | Payer: Medicaid Other | Attending: Emergency Medicine | Admitting: Emergency Medicine

## 2014-06-25 DIAGNOSIS — R52 Pain, unspecified: Secondary | ICD-10-CM

## 2014-06-25 DIAGNOSIS — E079 Disorder of thyroid, unspecified: Secondary | ICD-10-CM | POA: Insufficient documentation

## 2014-06-25 DIAGNOSIS — Z79899 Other long term (current) drug therapy: Secondary | ICD-10-CM | POA: Diagnosis not present

## 2014-06-25 DIAGNOSIS — Z792 Long term (current) use of antibiotics: Secondary | ICD-10-CM | POA: Insufficient documentation

## 2014-06-25 DIAGNOSIS — Z880 Allergy status to penicillin: Secondary | ICD-10-CM | POA: Diagnosis not present

## 2014-06-25 DIAGNOSIS — G43909 Migraine, unspecified, not intractable, without status migrainosus: Secondary | ICD-10-CM | POA: Insufficient documentation

## 2014-06-25 DIAGNOSIS — Z7952 Long term (current) use of systemic steroids: Secondary | ICD-10-CM | POA: Diagnosis not present

## 2014-06-25 DIAGNOSIS — I10 Essential (primary) hypertension: Secondary | ICD-10-CM | POA: Diagnosis not present

## 2014-06-25 DIAGNOSIS — Z8742 Personal history of other diseases of the female genital tract: Secondary | ICD-10-CM | POA: Insufficient documentation

## 2014-06-25 DIAGNOSIS — M79672 Pain in left foot: Secondary | ICD-10-CM | POA: Diagnosis present

## 2014-06-25 DIAGNOSIS — J45909 Unspecified asthma, uncomplicated: Secondary | ICD-10-CM | POA: Insufficient documentation

## 2014-06-25 MED ORDER — HYDROCODONE-ACETAMINOPHEN 5-325 MG PO TABS: 2.0000 | ORAL_TABLET | ORAL | Status: DC | PRN

## 2014-06-25 MED ORDER — HYDROCODONE-ACETAMINOPHEN 5-325 MG PO TABS
2.0000 | ORAL_TABLET | Freq: Once | ORAL | Status: AC
  Administered 2014-06-25: 2 via ORAL
  Filled 2014-06-25: qty 2

## 2014-06-25 NOTE — Discharge Instructions (Signed)
Plantar Fasciitis  Plantar fasciitis is a common condition that causes foot pain. It is soreness (inflammation) of the band of tough fibrous tissue on the bottom of the foot that runs from the heel bone (calcaneus) to the ball of the foot. The cause of this soreness may be from excessive standing, poor fitting shoes, running on hard surfaces, being overweight, having an abnormal walk, or overuse (this is common in runners) of the painful foot or feet. It is also common in aerobic exercise dancers and ballet dancers.  SYMPTOMS   Most people with plantar fasciitis complain of:   Severe pain in the morning on the bottom of their foot especially when taking the first steps out of bed. This pain recedes after a few minutes of walking.   Severe pain is experienced also during walking following a long period of inactivity.   Pain is worse when walking barefoot or up stairs  DIAGNOSIS    Your caregiver will diagnose this condition by examining and feeling your foot.   Special tests such as X-rays of your foot, are usually not needed.  PREVENTION    Consult a sports medicine professional before beginning a new exercise program.   Walking programs offer a good workout. With walking there is a lower chance of overuse injuries common to runners. There is less impact and less jarring of the joints.   Begin all new exercise programs slowly. If problems or pain develop, decrease the amount of time or distance until you are at a comfortable level.   Wear good shoes and replace them regularly.   Stretch your foot and the heel cords at the back of the ankle (Achilles tendon) both before and after exercise.   Run or exercise on even surfaces that are not hard. For example, asphalt is better than pavement.   Do not run barefoot on hard surfaces.   If using a treadmill, vary the incline.   Do not continue to workout if you have foot or joint problems. Seek professional help if they do not improve.  HOME CARE INSTRUCTIONS     Avoid activities that cause you pain until you recover.   Use ice or cold packs on the problem or painful areas after working out.   Only take over-the-counter or prescription medicines for pain, discomfort, or fever as directed by your caregiver.   Soft shoe inserts or athletic shoes with air or gel sole cushions may be helpful.   If problems continue or become more severe, consult a sports medicine caregiver or your own health care provider. Cortisone is a potent anti-inflammatory medication that may be injected into the painful area. You can discuss this treatment with your caregiver.  MAKE SURE YOU:    Understand these instructions.   Will watch your condition.   Will get help right away if you are not doing well or get worse.  Document Released: 04/12/2001 Document Revised: 10/10/2011 Document Reviewed: 06/11/2008  Exit[employer] Patient Information 2015 [employer]. This information is not intended to replace advice given to you by your health care provider. Make sure you discuss any questions you have with your health care provider.

## 2014-06-25 NOTE — ED Notes (Signed)
Got out of bed 2 nights ago and had pain to bottom of left foot  Getting worse

## 2014-06-25 NOTE — ED Notes (Signed)
Pt states she woke up last night to use bathroom and felt a sharp pain in her left foot, which caused her to stumble a bit.  Pain constant today, worsening since last night.  Pain worst with toe flexion or palpation to dorsal bridge of left foot.

## 2014-06-25 NOTE — ED Provider Notes (Signed)
CSN: [id number]     Arrival date & time 06/25/14  2024 History   First MD Initiated Contact with Patient 06/25/14 2216     Chief Complaint  Patient presents with  . Foot Pain     (Consider location/radiation/quality/duration/timing/severity/associated sxs/prior Treatment) Patient is a 38 y.o. female presenting with lower extremity pain. The history is provided by the patient. No language interpreter was used.  Foot Pain This is a new problem. The current episode started today. The problem occurs constantly. The problem has been gradually worsening. Associated symptoms include joint swelling and myalgias. Pertinent negatives include no numbness. Nothing aggravates the symptoms. She has tried nothing for the symptoms. The treatment provided moderate relief.  Pt complains of pain in her left foot. Pt reports sharp pain  with standing   Past Medical History  Diagnosis Date  . Hypertension   . Migraines   . Tendonitis   . Asthma   . Thyroid disease   . Renal disorder     stage 3   Past Surgical History  Procedure Laterality Date  . Cesarean section    . Cholecystectomy    . Dilation and curettage of uterus     Family History  Problem Relation Age of Onset  . Diabetes Mother   . Hypertension Mother   . Hypertension Father   . Diabetes Father   . Sudden death Father   . Heart attack Neg Hx   . Hyperlipidemia Neg Hx    History  Substance Use Topics  . Smoking status: Never Smoker   . Smokeless tobacco: Not on file  . Alcohol Use: No   OB History    No data available     Review of Systems  Musculoskeletal: Positive for myalgias and joint swelling.  Neurological: Negative for numbness.  All other systems reviewed and are negative.     Allergies  Maxalt; Adhesive; Penicillins; and Zithromax  Home Medications   Prior to Admission medications   Medication Sig Start Date End Date Taking? Authorizing Provider  albuterol (PROVENTIL HFA;VENTOLIN HFA) 108 (90 BASE)  MCG/ACT inhaler Inhale 2 puffs into the lungs every 6 (six) hours as needed. For shortness of breath    Historical Provider, MD  azithromycin (ZITHROMAX) 250 MG tablet Take 1 tablet (250 mg total) by mouth daily. Take first 2 tablets together, then 1 every day until finished. 07/22/13   Artis Delay, MD  cloNIDine (CATAPRES - DOSED IN MG/24 HR) 0.2 mg/24hr patch Place 1 patch onto the skin once a week.     Historical Provider, MD  cloNIDine (CATAPRES) 0.1 MG tablet Take 0.2 mg by mouth 2 (two) times daily.    Historical Provider, MD  cyclobenzaprine (FLEXERIL) 10 MG tablet Take 1 tablet (10 mg total) by mouth 2 (two) times daily as needed for muscle spasms. 10/04/13   Idalia Needle. [place], PA-C  HYDROcodone-acetaminophen (NORCO) 5-325 MG per tablet Take 2 tablets by mouth every 6 (six) hours as needed for pain. 04/20/13   Babette Relic, MD  HYDROcodone-acetaminophen (NORCO/VICODIN) 5-325 MG per tablet Take 2 tablets by mouth every 4 (four) hours as needed. 02/10/13   Fransico Meadow, PA-C  HYDROcodone-acetaminophen (NORCO/VICODIN) 5-325 MG per tablet Take 1 tablet by mouth every 4 (four) hours as needed. 10/04/13   Idalia Needle. [place], PA-C  LEVOTHYROXINE SODIUM PO Take 75 mcg by mouth.     Historical Provider, MD  losartan-hydrochlorothiazide (HYZAAR) 100-12.5 MG per tablet Take 1 tablet by mouth daily.  Historical Provider, MD  meloxicam (MOBIC) 7.5 MG tablet Take 1 tablet (7.5 mg total) by mouth daily. 06/17/13   April K Palumbo-Rasch, MD  methocarbamol (ROBAXIN) 500 MG tablet Take 1 tablet (500 mg total) by mouth 2 (two) times daily. 02/10/13   Fransico Meadow, PA-C  METOPROLOL TARTRATE PO Take by mouth.    Historical Provider, MD  ondansetron (ZOFRAN) 4 MG tablet Take 1 tablet (4 mg total) by mouth every 6 (six) hours. 07/19/13   Orpah Greek, MD  oseltamivir (TAMIFLU) 75 MG capsule Take 1 capsule (75 mg total) by mouth every 12 (twelve) hours. 07/19/13   Orpah Greek, MD   oxymetazoline (AFRIN NASAL SPRAY) 0.05 % nasal spray Place 2 sprays into the nose 2 (two) times daily. X 3 days only then stop 04/20/13   Babette Relic, MD  predniSONE (DELTASONE) 20 MG tablet 3 tabs po day one, then 2 tabs daily x 4 days 07/19/13   Orpah Greek, MD  sodium chloride (OCEAN) 0.65 % SOLN nasal spray Place 1 spray into the nose as needed for congestion. 04/20/13   Babette Relic, MD   BP 166/85 mmHg  Pulse 69  Temp(Src) 96.8 F (36 C) (Oral)  Ht 5\' 6"  (1.676 m)  Wt 390 lb (176.903 kg)  BMI 62.98 kg/m2  SpO2 98%  LMP 05/31/2014 Physical Exam  Constitutional: She appears well-developed and well-nourished.  Musculoskeletal: She exhibits tenderness.  Tender left foot, heel and mid foot,  nv and ns intact  Neurological: She is alert.  Skin: Skin is warm.  Psychiatric: She has a normal mood and affect.  Nursing note and vitals reviewed.   ED Course  Procedures (including critical care time) Labs Review Labs Reviewed - No data to display  Imaging Review Dg Foot Complete Left  06/25/2014   CLINICAL DATA:  Foot pain with no known injury.  EXAM: LEFT FOOT - COMPLETE 3+ VIEW  COMPARISON:  None currently available  FINDINGS: There is no evidence of fracture or dislocation. No erosive changes or focal bone lesion. Small bony bunion noted at the first MTP joint.  IMPRESSION: 1. No acute findings. 2. Small bony bunion.   Electronically Signed   By: Jorje Guild M.D.   On: 06/25/2014 22:04     EKG Interpretation None      MDM   Final diagnoses:  Left foot pain    Ace wrap Post op shoe Follow up with Dr. Barbaraann Barthel if pain persist past one week.    Fransico Meadow, PA-C 06/25/14 [place], PA-C 06/25/14 [place], PA-C 06/25/14 [place], MD 06/25/14 [phone]

## 2014-06-25 NOTE — ED Provider Notes (Signed)
CSN: [id number]     Arrival date & time 06/25/14  2024 History   First MD Initiated Contact with Patient 06/25/14 2216     Chief Complaint  Patient presents with  . Foot Pain     (Consider location/radiation/quality/duration/timing/severity/associated sxs/prior Treatment) HPI  Past Medical History  Diagnosis Date  . Hypertension   . Migraines   . Tendonitis   . Asthma   . Thyroid disease   . Renal disorder     stage 3   Past Surgical History  Procedure Laterality Date  . Cesarean section    . Cholecystectomy    . Dilation and curettage of uterus     Family History  Problem Relation Age of Onset  . Diabetes Mother   . Hypertension Mother   . Hypertension Father   . Diabetes Father   . Sudden death Father   . Heart attack Neg Hx   . Hyperlipidemia Neg Hx    History  Substance Use Topics  . Smoking status: Never Smoker   . Smokeless tobacco: Not on file  . Alcohol Use: No   OB History    No data available     Review of Systems    Allergies  Maxalt; Adhesive; Penicillins; and Zithromax  Home Medications   Prior to Admission medications   Medication Sig Start Date End Date Taking? Authorizing Provider  albuterol (PROVENTIL HFA;VENTOLIN HFA) 108 (90 BASE) MCG/ACT inhaler Inhale 2 puffs into the lungs every 6 (six) hours as needed. For shortness of breath    Historical Provider, MD  azithromycin (ZITHROMAX) 250 MG tablet Take 1 tablet (250 mg total) by mouth daily. Take first 2 tablets together, then 1 every day until finished. 07/22/13   Artis Delay, MD  cloNIDine (CATAPRES - DOSED IN MG/24 HR) 0.2 mg/24hr patch Place 1 patch onto the skin once a week.     Historical Provider, MD  cloNIDine (CATAPRES) 0.1 MG tablet Take 0.2 mg by mouth 2 (two) times daily.    Historical Provider, MD  cyclobenzaprine (FLEXERIL) 10 MG tablet Take 1 tablet (10 mg total) by mouth 2 (two) times daily as needed for muscle spasms. 10/04/13   Idalia Needle. Sanford, PA-C   HYDROcodone-acetaminophen (NORCO) 5-325 MG per tablet Take 2 tablets by mouth every 6 (six) hours as needed for pain. 04/20/13   Babette Relic, MD  HYDROcodone-acetaminophen (NORCO/VICODIN) 5-325 MG per tablet Take 2 tablets by mouth every 4 (four) hours as needed. 02/10/13   Fransico Meadow, PA-C  HYDROcodone-acetaminophen (NORCO/VICODIN) 5-325 MG per tablet Take 1 tablet by mouth every 4 (four) hours as needed. 10/04/13   Idalia Needle. Sanford, PA-C  LEVOTHYROXINE SODIUM PO Take 75 mcg by mouth.     Historical Provider, MD  losartan-hydrochlorothiazide (HYZAAR) 100-12.5 MG per tablet Take 1 tablet by mouth daily.      Historical Provider, MD  meloxicam (MOBIC) 7.5 MG tablet Take 1 tablet (7.5 mg total) by mouth daily. 06/17/13   April K Palumbo-Rasch, MD  methocarbamol (ROBAXIN) 500 MG tablet Take 1 tablet (500 mg total) by mouth 2 (two) times daily. 02/10/13   Fransico Meadow, PA-C  METOPROLOL TARTRATE PO Take by mouth.    Historical Provider, MD  ondansetron (ZOFRAN) 4 MG tablet Take 1 tablet (4 mg total) by mouth every 6 (six) hours. 07/19/13   Orpah Greek, MD  oseltamivir (TAMIFLU) 75 MG capsule Take 1 capsule (75 mg total) by mouth every 12 (twelve) hours. 07/19/13   Harrell Gave  J. Pollina, MD  oxymetazoline (AFRIN NASAL SPRAY) 0.05 % nasal spray Place 2 sprays into the nose 2 (two) times daily. X 3 days only then stop 04/20/13   Babette Relic, MD  predniSONE (DELTASONE) 20 MG tablet 3 tabs po day one, then 2 tabs daily x 4 days 07/19/13   Orpah Greek, MD  sodium chloride (OCEAN) 0.65 % SOLN nasal spray Place 1 spray into the nose as needed for congestion. 04/20/13   Babette Relic, MD   BP 166/85 mmHg  Pulse 69  Temp(Src) 96.8 F (36 C) (Oral)  Ht 5\' 6"  (1.676 m)  Wt 390 lb (176.903 kg)  BMI 62.98 kg/m2  SpO2 98%  LMP 05/31/2014 Physical Exam  ED Course  Procedures (including critical care time) Labs Review Labs Reviewed - No data to display  Imaging Review Dg Foot  Complete Left  06/25/2014   CLINICAL DATA:  Foot pain with no known injury.  EXAM: LEFT FOOT - COMPLETE 3+ VIEW  COMPARISON:  None currently available  FINDINGS: There is no evidence of fracture or dislocation. No erosive changes or focal bone lesion. Small bony bunion noted at the first MTP joint.  IMPRESSION: 1. No acute findings. 2. Small bony bunion.   Electronically Signed   By: Jorje Guild M.D.   On: 06/25/2014 22:04     EKG Interpretation None      MDM   Final diagnoses:  Left foot pain        Fransico Meadow, PA-C 06/25/14 [place], MD 06/25/14 [phone]

## 2014-07-27 ENCOUNTER — Encounter (HOSPITAL_BASED_OUTPATIENT_CLINIC_OR_DEPARTMENT_OTHER): Payer: Self-pay

## 2014-07-27 ENCOUNTER — Emergency Department (HOSPITAL_BASED_OUTPATIENT_CLINIC_OR_DEPARTMENT_OTHER)
Admission: EM | Admit: 2014-07-27 | Discharge: 2014-07-27 | Disposition: A | Discharge status: Home / Self Care | Payer: Medicaid Other | Attending: Emergency Medicine | Admitting: Emergency Medicine

## 2014-07-27 ENCOUNTER — Emergency Department (HOSPITAL_BASED_OUTPATIENT_CLINIC_OR_DEPARTMENT_OTHER): Payer: Medicaid Other

## 2014-07-27 DIAGNOSIS — Z79899 Other long term (current) drug therapy: Secondary | ICD-10-CM | POA: Diagnosis not present

## 2014-07-27 DIAGNOSIS — Z791 Long term (current) use of non-steroidal anti-inflammatories (NSAID): Secondary | ICD-10-CM | POA: Diagnosis not present

## 2014-07-27 DIAGNOSIS — Y9389 Activity, other specified: Secondary | ICD-10-CM | POA: Insufficient documentation

## 2014-07-27 DIAGNOSIS — Y998 Other external cause status: Secondary | ICD-10-CM | POA: Insufficient documentation

## 2014-07-27 DIAGNOSIS — Z87448 Personal history of other diseases of urinary system: Secondary | ICD-10-CM | POA: Diagnosis not present

## 2014-07-27 DIAGNOSIS — E079 Disorder of thyroid, unspecified: Secondary | ICD-10-CM | POA: Diagnosis not present

## 2014-07-27 DIAGNOSIS — W1839XA Other fall on same level, initial encounter: Secondary | ICD-10-CM | POA: Insufficient documentation

## 2014-07-27 DIAGNOSIS — G43909 Migraine, unspecified, not intractable, without status migrainosus: Secondary | ICD-10-CM | POA: Insufficient documentation

## 2014-07-27 DIAGNOSIS — Z880 Allergy status to penicillin: Secondary | ICD-10-CM | POA: Insufficient documentation

## 2014-07-27 DIAGNOSIS — M5431 Sciatica, right side: Secondary | ICD-10-CM

## 2014-07-27 DIAGNOSIS — J45909 Unspecified asthma, uncomplicated: Secondary | ICD-10-CM | POA: Insufficient documentation

## 2014-07-27 DIAGNOSIS — S8991XA Unspecified injury of right lower leg, initial encounter: Secondary | ICD-10-CM | POA: Diagnosis not present

## 2014-07-27 DIAGNOSIS — Y92410 Unspecified street and highway as the place of occurrence of the external cause: Secondary | ICD-10-CM | POA: Diagnosis not present

## 2014-07-27 DIAGNOSIS — I10 Essential (primary) hypertension: Secondary | ICD-10-CM | POA: Insufficient documentation

## 2014-07-27 DIAGNOSIS — M5430 Sciatica, unspecified side: Secondary | ICD-10-CM | POA: Diagnosis not present

## 2014-07-27 MED ORDER — HYDROCODONE-ACETAMINOPHEN 5-325 MG PO TABS: 2.0000 | ORAL_TABLET | ORAL | Status: DC | PRN

## 2014-07-27 MED ORDER — CLONIDINE HCL 0.1 MG PO TABS
0.2000 mg | ORAL_TABLET | Freq: Once | ORAL | Status: AC
  Administered 2014-07-27: 0.2 mg via ORAL
  Filled 2014-07-27: qty 2

## 2014-07-27 MED ORDER — PREDNISONE 20 MG PO TABS: ORAL_TABLET | ORAL | Status: DC

## 2014-07-27 NOTE — ED Notes (Signed)
Patient here with right hip and knee pain x 3 weeks, also right ankle and foot pain since fall on Wednesday. Ambulatory at triage

## 2014-07-27 NOTE — ED Provider Notes (Signed)
CSN: [id number]     Arrival date & time 07/27/14  1059 History   First MD Initiated Contact with Patient 07/27/14 1112     Chief Complaint  Patient presents with  . Leg Pain     (Consider location/radiation/quality/duration/timing/severity/associated sxs/prior Treatment) HPI Comments: Patient presents to the ER for evaluation of pain in the back and hip for 3 weeks and right knee pain for several days. Patient reports that she had onset of pain in the right lower back approximately 3 weeks ago. She has noticed that this has gotten worse. The pain radiates down the right hip. She reports that it pulls when she bends the hip. She has not had any weakness in the lower extremity is. No numbness or tingling. No urinary symptoms.  Patient does report that she had a fall 5 days ago. Since then she has been having pain in the knee. She has slight pain in the ankle and foot as well, but those seem to be improving.   Past Medical History  Diagnosis Date  . Hypertension   . Migraines   . Tendonitis   . Asthma   . Thyroid disease   . Renal disorder     stage 3   Past Surgical History  Procedure Laterality Date  . Cesarean section    . Cholecystectomy    . Dilation and curettage of uterus     Family History  Problem Relation Age of Onset  . Diabetes Mother   . Hypertension Mother   . Hypertension Father   . Diabetes Father   . Sudden death Father   . Heart attack Neg Hx   . Hyperlipidemia Neg Hx    History  Substance Use Topics  . Smoking status: Never Smoker   . Smokeless tobacco: Not on file  . Alcohol Use: No   OB History    No data available     Review of Systems  Musculoskeletal: Positive for arthralgias.  All other systems reviewed and are negative.     Allergies  Maxalt; Adhesive; Penicillins; and Zithromax  Home Medications   Prior to Admission medications   Medication Sig Start Date End Date Taking? Authorizing Provider  albuterol (PROVENTIL HFA;VENTOLIN  HFA) 108 (90 BASE) MCG/ACT inhaler Inhale 2 puffs into the lungs every 6 (six) hours as needed. For shortness of breath    Historical Provider, MD  cloNIDine (CATAPRES - DOSED IN MG/24 HR) 0.2 mg/24hr patch Place 1 patch onto the skin once a week.     Historical Provider, MD  cloNIDine (CATAPRES) 0.1 MG tablet Take 0.2 mg by mouth 2 (two) times daily.    Historical Provider, MD  cyclobenzaprine (FLEXERIL) 10 MG tablet Take 1 tablet (10 mg total) by mouth 2 (two) times daily as needed for muscle spasms. 10/04/13   Idalia Needle. Sanford, PA-C  HYDROcodone-acetaminophen (NORCO/VICODIN) 5-325 MG per tablet Take 2 tablets by mouth every 4 (four) hours as needed. 06/25/14   Fransico Meadow, PA-C  LEVOTHYROXINE SODIUM PO Take 75 mcg by mouth.     Historical Provider, MD  losartan-hydrochlorothiazide (HYZAAR) 100-12.5 MG per tablet Take 1 tablet by mouth daily.      Historical Provider, MD  meloxicam (MOBIC) 7.5 MG tablet Take 1 tablet (7.5 mg total) by mouth daily. 06/17/13   April K Palumbo-Rasch, MD  methocarbamol (ROBAXIN) 500 MG tablet Take 1 tablet (500 mg total) by mouth 2 (two) times daily. 02/10/13   Fransico Meadow, PA-C  METOPROLOL TARTRATE PO Take  by mouth.    Historical Provider, MD  ondansetron (ZOFRAN) 4 MG tablet Take 1 tablet (4 mg total) by mouth every 6 (six) hours. 07/19/13   Orpah Greek, MD  sodium chloride (OCEAN) 0.65 % SOLN nasal spray Place 1 spray into the nose as needed for congestion. 04/20/13   Babette Relic, MD   BP 181/94 mmHg  Pulse 60  Temp(Src) 97.9 F (36.6 C) (Oral)  Resp 20  Wt 390 lb (176.903 kg)  SpO2 100%  LMP 07/03/2014 (Approximate) Physical Exam  Constitutional: She is oriented to person, place, and time. She appears well-developed and well-nourished. No distress.  HENT:  Head: Normocephalic and atraumatic.  Right Ear: Hearing normal.  Left Ear: Hearing normal.  Nose: Nose normal.  Mouth/Throat: Oropharynx is clear and moist and mucous membranes are  normal.  Eyes: Conjunctivae and EOM are normal. Pupils are equal, round, and reactive to light.  Neck: Normal range of motion. Neck supple.  Cardiovascular: Regular rhythm, S1 normal and S2 normal.  Exam reveals no gallop and no friction rub.   No murmur heard. Pulmonary/Chest: Effort normal and breath sounds normal. No respiratory distress. She exhibits no tenderness.  Abdominal: Soft. Normal appearance and bowel sounds are normal. There is no hepatosplenomegaly. There is no tenderness. There is no rebound, no guarding, no tenderness at McBurney's point and negative Murphy's sign. No hernia.  Musculoskeletal:       Right knee: She exhibits decreased range of motion (pain). She exhibits no swelling, no effusion, no ecchymosis, no deformity, no erythema and normal alignment. Tenderness found.       Right ankle: She exhibits normal range of motion and no swelling. No lateral malleolus and no medial malleolus tenderness found.       Lumbar back: She exhibits tenderness. She exhibits no bony tenderness.       Back:       Right lower leg: She exhibits no tenderness and no swelling.       Right foot: There is normal range of motion, no tenderness and no swelling.  Neurological: She is alert and oriented to person, place, and time. She has normal strength. No cranial nerve deficit or sensory deficit. Coordination normal. GCS eye subscore is 4. GCS verbal subscore is 5. GCS motor subscore is 6.  Skin: Skin is warm, dry and intact. No rash noted. No cyanosis.  Psychiatric: She has a normal mood and affect. Her speech is normal and behavior is normal. Thought content normal.  Nursing note and vitals reviewed.   ED Course  Procedures (including critical care time) Labs Review Labs Reviewed - No data to display  Imaging Review Dg Knee Complete 4 Views Right  07/27/2014   CLINICAL DATA:  Fall 3 days ago  EXAM: RIGHT KNEE - COMPLETE 4+ VIEW  COMPARISON:  None.  FINDINGS: No acute fracture. No  dislocation. Tricompartment osteoarthritic change.  IMPRESSION: No acute bony pathology.   Electronically Signed   By: Maryclare Bean M.D.   On: 07/27/2014 12:07     EKG Interpretation None      MDM   Final diagnoses:  Knee injury, right, initial encounter  Sciatica  Patient presents to the ER for evaluation of multiple complaints. Patient has been experiencing pain in the right lower back and down the back of the leg for 3 weeks. This is consistent with sciatica. She does not have any functional deficit. There are no sensory deficits.  She also complaining of pain in the  lower leg after a fall 5 days ago. Pain is mostly in the knee. X-ray of knee shows tricompartmental arthritis but no acute fracture. She had complaints of pain in the ankle and foot, but did not have any tenderness, swelling, bruising or deformity in these regions. X-rays were therefore not necessary.  She will be treated with analgesia and prednisone, follow-up with her doctor.  He did have elevated blood pressure upon arrival to the ER. She has chronic hypertension. She did not take any of her medications this morning. She was administered by mouth clonidine and is starting to improve. She was asymptomatic.    Orpah Greek, MD 07/27/14 [phone]

## 2014-07-27 NOTE — Discharge Instructions (Signed)
Sciatica °Sciatica is pain, weakness, numbness, or tingling along the path of the sciatic nerve. The nerve starts in the lower back and runs down the back of each leg. The nerve controls the muscles in the lower leg and in the back of the knee, while also providing sensation to the back of the thigh, lower leg, and the sole of your foot. Sciatica is a symptom of another medical condition. For instance, nerve damage or certain conditions, such as a herniated disk or bone spur on the spine, pinch or put pressure on the sciatic nerve. This causes the pain, weakness, or other sensations normally associated with sciatica. Generally, sciatica only affects one side of the body. °CAUSES  °· Herniated or slipped disc. °· Degenerative disk disease. °· A pain disorder involving the narrow muscle in the buttocks (piriformis syndrome). °· Pelvic injury or fracture. °· Pregnancy. °· Tumor (rare). °SYMPTOMS  °Symptoms can vary from mild to very severe. The symptoms usually travel from the low back to the buttocks and down the back of the leg. Symptoms can include: °· Mild tingling or dull aches in the lower back, leg, or hip. °· Numbness in the back of the calf or sole of the foot. °· Burning sensations in the lower back, leg, or hip. °· Sharp pains in the lower back, leg, or hip. °· Leg weakness. °· Severe back pain inhibiting movement. °These symptoms may get worse with coughing, sneezing, laughing, or prolonged sitting or standing. Also, being overweight may worsen symptoms. °DIAGNOSIS  °Your caregiver will perform a physical exam to look for common symptoms of sciatica. He or she may ask you to do certain movements or activities that would trigger sciatic nerve pain. Other tests may be performed to find the cause of the sciatica. These may include: °· Blood tests. °· X-rays. °· Imaging tests, such as an MRI or CT scan. °TREATMENT  °Treatment is directed at the cause of the sciatic pain. Sometimes, treatment is not necessary  and the pain and discomfort goes away on its own. If treatment is needed, your caregiver may suggest: °· Over-the-counter medicines to relieve pain. °· Prescription medicines, such as anti-inflammatory medicine, muscle relaxants, or narcotics. °· Applying heat or ice to the painful area. °· Steroid injections to lessen pain, irritation, and inflammation around the nerve. °· Reducing activity during periods of pain. °· Exercising and stretching to strengthen your abdomen and improve flexibility of your spine. Your caregiver may suggest losing weight if the extra weight makes the back pain worse. °· Physical therapy. °· Surgery to eliminate what is pressing or pinching the nerve, such as a bone spur or part of a herniated disk. °HOME CARE INSTRUCTIONS  °· Only take over-the-counter or prescription medicines for pain or discomfort as directed by your caregiver. °· Apply ice to the affected area for 20 minutes, 3-4 times a day for the first 48-72 hours. Then try heat in the same way. °· Exercise, stretch, or perform your usual activities if these do not aggravate your pain. °· Attend physical therapy sessions as directed by your caregiver. °· Keep all follow-up appointments as directed by your caregiver. °· Do not wear high heels or shoes that do not provide proper support. °· Check your mattress to see if it is too soft. A firm mattress may lessen your pain and discomfort. °SEEK IMMEDIATE MEDICAL CARE IF:  °· You lose control of your bowel or bladder (incontinence). °· You have increasing weakness in the lower back, pelvis, buttocks,   or legs.  You have redness or swelling of your back.  You have a burning sensation when you urinate.  You have pain that gets worse when you lie down or awakens you at night.  Your pain is worse than you have experienced in the past.  Your pain is lasting longer than 4 weeks.  You are suddenly losing weight without reason. MAKE SURE YOU:  Understand these  instructions.  Will watch your condition.  Will get help right away if you are not doing well or get worse. Document Released: 07/12/2001 Document Revised: 01/17/2012 Document Reviewed: 11/27/2011 [facility] Patient Information 2015 [place], [place]. This information is not intended to replace advice given to you by your health care provider. Make sure you discuss any questions you have with your health care provider. Contusion A contusion is a deep bruise. Contusions are the result of an injury that caused bleeding under the skin. The contusion may turn blue, purple, or yellow. Minor injuries will give you a painless contusion, but more severe contusions may stay painful and swollen for a few weeks.  CAUSES  A contusion is usually caused by a blow, trauma, or direct force to an area of the body. SYMPTOMS   Swelling and redness of the injured area.  Bruising of the injured area.  Tenderness and soreness of the injured area.  Pain. DIAGNOSIS  The diagnosis can be made by taking a history and physical exam. An X-ray, CT scan, or MRI may be needed to determine if there were any associated injuries, such as fractures. TREATMENT  Specific treatment will depend on what area of the body was injured. In general, the best treatment for a contusion is resting, icing, elevating, and applying cold compresses to the injured area. Over-the-counter medicines may also be recommended for pain control. Ask your caregiver what the best treatment is for your contusion. HOME CARE INSTRUCTIONS   Put ice on the injured area.  Put ice in a plastic bag.  Place a towel between your skin and the bag.  Leave the ice on for 15-20 minutes, 3-4 times a day, or as directed by your health care provider.  Only take over-the-counter or prescription medicines for pain, discomfort, or fever as directed by your caregiver. Your caregiver may recommend avoiding anti-inflammatory medicines (aspirin, ibuprofen, and naproxen)  for 48 hours because these medicines may increase bruising.  Rest the injured area.  If possible, elevate the injured area to reduce swelling. SEEK IMMEDIATE MEDICAL CARE IF:   You have increased bruising or swelling.  You have pain that is getting worse.  Your swelling or pain is not relieved with medicines. MAKE SURE YOU:   Understand these instructions.  Will watch your condition.  Will get help right away if you are not doing well or get worse. Document Released: 04/27/2005 Document Revised: 07/23/2013 Document Reviewed: 05/23/2011 [facility] Patient Information 2015 [place], [place]. This information is not intended to replace advice given to you by your health care provider. Make sure you discuss any questions you have with your health care provider.

## 2014-08-10 ENCOUNTER — Emergency Department (HOSPITAL_BASED_OUTPATIENT_CLINIC_OR_DEPARTMENT_OTHER)
Admission: EM | Admit: 2014-08-10 | Discharge: 2014-08-10 | Disposition: A | Discharge status: Home / Self Care | Payer: Medicaid Other | Attending: Emergency Medicine | Admitting: Emergency Medicine

## 2014-08-10 ENCOUNTER — Emergency Department (HOSPITAL_BASED_OUTPATIENT_CLINIC_OR_DEPARTMENT_OTHER): Payer: Medicaid Other

## 2014-08-10 ENCOUNTER — Encounter (HOSPITAL_BASED_OUTPATIENT_CLINIC_OR_DEPARTMENT_OTHER): Payer: Self-pay | Admitting: *Deleted

## 2014-08-10 DIAGNOSIS — E079 Disorder of thyroid, unspecified: Secondary | ICD-10-CM | POA: Insufficient documentation

## 2014-08-10 DIAGNOSIS — M60271 Foreign body granuloma of soft tissue, not elsewhere classified, right ankle and foot: Secondary | ICD-10-CM | POA: Insufficient documentation

## 2014-08-10 DIAGNOSIS — Y9389 Activity, other specified: Secondary | ICD-10-CM | POA: Insufficient documentation

## 2014-08-10 DIAGNOSIS — Y92019 Unspecified place in single-family (private) house as the place of occurrence of the external cause: Secondary | ICD-10-CM | POA: Diagnosis not present

## 2014-08-10 DIAGNOSIS — W25XXXA Contact with sharp glass, initial encounter: Secondary | ICD-10-CM | POA: Insufficient documentation

## 2014-08-10 DIAGNOSIS — Y998 Other external cause status: Secondary | ICD-10-CM | POA: Diagnosis not present

## 2014-08-10 DIAGNOSIS — Z1881 Retained glass fragments: Secondary | ICD-10-CM | POA: Insufficient documentation

## 2014-08-10 DIAGNOSIS — J45909 Unspecified asthma, uncomplicated: Secondary | ICD-10-CM | POA: Insufficient documentation

## 2014-08-10 DIAGNOSIS — M795 Residual foreign body in soft tissue: Secondary | ICD-10-CM

## 2014-08-10 DIAGNOSIS — N183 Chronic kidney disease, stage 3 (moderate): Secondary | ICD-10-CM | POA: Insufficient documentation

## 2014-08-10 DIAGNOSIS — S99921A Unspecified injury of right foot, initial encounter: Secondary | ICD-10-CM | POA: Diagnosis present

## 2014-08-10 DIAGNOSIS — M79671 Pain in right foot: Secondary | ICD-10-CM

## 2014-08-10 DIAGNOSIS — I129 Hypertensive chronic kidney disease with stage 1 through stage 4 chronic kidney disease, or unspecified chronic kidney disease: Secondary | ICD-10-CM | POA: Insufficient documentation

## 2014-08-10 DIAGNOSIS — G43909 Migraine, unspecified, not intractable, without status migrainosus: Secondary | ICD-10-CM | POA: Insufficient documentation

## 2014-08-10 DIAGNOSIS — Z79899 Other long term (current) drug therapy: Secondary | ICD-10-CM | POA: Diagnosis not present

## 2014-08-10 DIAGNOSIS — Z880 Allergy status to penicillin: Secondary | ICD-10-CM | POA: Diagnosis not present

## 2014-08-10 MED ORDER — LIDOCAINE HCL (PF) 1 % IJ SOLN
INTRAMUSCULAR | Status: AC
  Filled 2014-08-10: qty 5

## 2014-08-10 MED ORDER — ACETAMINOPHEN 325 MG PO TABS
650.0000 mg | ORAL_TABLET | Freq: Once | ORAL | Status: AC
  Administered 2014-08-10: 650 mg via ORAL
  Filled 2014-08-10: qty 2

## 2014-08-10 NOTE — ED Notes (Signed)
MD with pt  

## 2014-08-10 NOTE — ED Notes (Signed)
Transported to xray 

## 2014-08-10 NOTE — ED Notes (Addendum)
C/o right foot pain that started a couple days ago. States she thinks she may have stepped on something, but unsure. States she did have a broken light bulb in her house. Denies any fevers. Only complaint is pain with ambulation. On assessment no break in skin noted to posterior aspect of right foot, but painful area noted to heel area and pin point dark area noted to heel where pt has pain.

## 2014-08-10 NOTE — ED Provider Notes (Signed)
CSN: [id number]     Arrival date & time 08/10/14  0418 History   First MD Initiated Contact with Patient 08/10/14 [phone]     Chief Complaint  Patient presents with  . Foot Pain     (Consider location/radiation/quality/duration/timing/severity/associated sxs/prior Treatment) HPI Comments: 39 year old female with history of high blood pressure, renal disease, asthma presents with pain plantar aspect of right proximal foot for the past 2 days. Patient does not recall stepping on anything however feels a focal area of tenderness and possible foreign body. No fevers chills or drainage.  Patient is a 39 y.o. female presenting with lower extremity pain. The history is provided by the patient.  Foot Pain    Past Medical History  Diagnosis Date  . Hypertension   . Migraines   . Tendonitis   . Asthma   . Thyroid disease   . Renal disorder     stage 3   Past Surgical History  Procedure Laterality Date  . Cesarean section    . Cholecystectomy    . Dilation and curettage of uterus     Family History  Problem Relation Age of Onset  . Diabetes Mother   . Hypertension Mother   . Hypertension Father   . Diabetes Father   . Sudden death Father   . Heart attack Neg Hx   . Hyperlipidemia Neg Hx    History  Substance Use Topics  . Smoking status: Never Smoker   . Smokeless tobacco: Not on file  . Alcohol Use: No   OB History    No data available     Review of Systems  Constitutional: Negative for fever.  Musculoskeletal: Negative for joint swelling.  Skin: Positive for wound. Negative for rash.      Allergies  Maxalt; Adhesive; Penicillins; and Zithromax  Home Medications   Prior to Admission medications   Medication Sig Start Date End Date Taking? Authorizing Provider  albuterol (PROVENTIL HFA;VENTOLIN HFA) 108 (90 BASE) MCG/ACT inhaler Inhale 2 puffs into the lungs every 6 (six) hours as needed. For shortness of breath    Historical Provider, MD  cloNIDine (CATAPRES)  0.1 MG tablet Take 0.2 mg by mouth 2 (two) times daily.    Historical Provider, MD  HYDROcodone-acetaminophen (NORCO/VICODIN) 5-325 MG per tablet Take 2 tablets by mouth every 4 (four) hours as needed for moderate pain. 07/27/14   Orpah Greek, MD  LEVOTHYROXINE SODIUM PO Take 75 mcg by mouth.     Historical Provider, MD  losartan-hydrochlorothiazide (HYZAAR) 100-12.5 MG per tablet Take 1 tablet by mouth daily.      Historical Provider, MD  METOPROLOL TARTRATE PO Take by mouth.    Historical Provider, MD  ondansetron (ZOFRAN) 4 MG tablet Take 1 tablet (4 mg total) by mouth every 6 (six) hours. 07/19/13   Orpah Greek, MD  predniSONE (DELTASONE) 20 MG tablet 3 tabs po daily x 3 days, then 2 tabs x 3 days, then 1.5 tabs x 3 days, then 1 tab x 3 days, then 0.5 tabs x 3 days 07/27/14   Orpah Greek, MD  sodium chloride (OCEAN) 0.65 % SOLN nasal spray Place 1 spray into the nose as needed for congestion. 04/20/13   Babette Relic, MD   BP 187/97 mmHg  Pulse 61  Temp(Src) 98.2 F (36.8 C) (Oral)  Resp 20  Ht 5' 6.5" (1.689 m)  Wt 390 lb (176.903 kg)  BMI 62.01 kg/m2  SpO2 96%  LMP 07/15/2014 Physical Exam  Constitutional: She appears well-developed and well-nourished. No distress.  Cardiovascular: Normal rate.   Pulmonary/Chest: Effort normal.  Musculoskeletal: She exhibits tenderness.  Patient has 0.5 cm area of focal tenderness plantar aspect of right proximal foot near heel. No drainage, erythema or induration.  Nursing note and vitals reviewed.   ED Course  Procedures (including critical care time) Labs Review Labs Reviewed - No data to display  Imaging Review Dg Foot 2 Views Right  08/10/2014   CLINICAL DATA:  Patient may have stepped on glass and has a sore area were she believes glass is in the foot.  EXAM: RIGHT FOOT - 2 VIEW  COMPARISON:  None.  FINDINGS: Degenerative changes in the interphalangeal joints and first metatarsal-phalangeal joint. No  evidence of acute fracture or dislocation. In the plantar soft tissues over the heel, deep to the BB marker, there is a 2 mm length radiopaque foreign body which is localized about 3 mm below the skin surface. This is consistent with history of glass fragment.  IMPRESSION: Radiopaque foreign body consistent with glass fragment in the plantar soft tissues over the heel due to the BB localizing marker.   Electronically Signed   By: Lucienne Capers M.D.   On: 08/10/2014 06:01     EKG Interpretation None      MDM   Final diagnoses:  Foreign body (FB) in soft tissue  Right foot pain   Patient with possible foreign body plantar right foot. No sign of infection. X-ray ordered and reviewed to look for obvious foreign body.  Clinically and x-ray confirms likely foreign body, glass. On exam difficult to localize specific piece. Discussed risks and benefits of incision for removal, patient wishes to hold at this time and monitor pain. X-ray reviewed. Results and differential diagnosis were discussed with the patient/parent/guardian. Close follow up outpatient was discussed, comfortable with the plan.   Medications  lidocaine (PF) (XYLOCAINE) 1 % injection (not administered)  acetaminophen (TYLENOL) tablet 650 mg (650 mg Oral Given 08/10/14 0509)    Filed Vitals:   08/10/14 0436  BP: 187/97  Pulse: 61  Temp: 98.2 F (36.8 C)  TempSrc: Oral  Resp: 20  Height: 5' 6.5" (1.689 m)  Weight: 390 lb (176.903 kg)  SpO2: 96%    Final diagnoses:  Foreign body (FB) in soft tissue  Right foot pain       Dawn Clonts, MD 08/10/14 [phone]

## 2014-08-10 NOTE — Discharge Instructions (Signed)
Take ibuprofen and tylenol for pain.  If you were given medicines take as directed.  If you are on coumadin or contraceptives realize their levels and effectiveness is altered by many different medicines.  If you have any reaction (rash, tongues swelling, other) to the medicines stop taking and see a physician.   Have blood pressure rechecked in 4-5 days. Please follow up as directed and return to the ER or see a physician for new or worsening symptoms.  Thank you. Filed Vitals:   08/10/14 0436  BP: 187/97  Pulse: 61  Temp: 98.2 F (36.8 C)  TempSrc: Oral  Resp: 20  Height: 5' 6.5" (1.689 m)  Weight: 390 lb (176.903 kg)  SpO2: 96%

## 2015-01-06 ENCOUNTER — Encounter (HOSPITAL_BASED_OUTPATIENT_CLINIC_OR_DEPARTMENT_OTHER): Payer: Self-pay | Admitting: *Deleted

## 2015-01-06 ENCOUNTER — Emergency Department (HOSPITAL_BASED_OUTPATIENT_CLINIC_OR_DEPARTMENT_OTHER)
Admission: EM | Admit: 2015-01-06 | Discharge: 2015-01-06 | Disposition: A | Discharge status: Home / Self Care | Payer: Medicaid Other | Attending: Emergency Medicine | Admitting: Emergency Medicine

## 2015-01-06 ENCOUNTER — Emergency Department (HOSPITAL_BASED_OUTPATIENT_CLINIC_OR_DEPARTMENT_OTHER): Payer: Medicaid Other

## 2015-01-06 DIAGNOSIS — W25XXXA Contact with sharp glass, initial encounter: Secondary | ICD-10-CM | POA: Insufficient documentation

## 2015-01-06 DIAGNOSIS — Z79899 Other long term (current) drug therapy: Secondary | ICD-10-CM | POA: Insufficient documentation

## 2015-01-06 DIAGNOSIS — I129 Hypertensive chronic kidney disease with stage 1 through stage 4 chronic kidney disease, or unspecified chronic kidney disease: Secondary | ICD-10-CM | POA: Insufficient documentation

## 2015-01-06 DIAGNOSIS — G43909 Migraine, unspecified, not intractable, without status migrainosus: Secondary | ICD-10-CM | POA: Diagnosis not present

## 2015-01-06 DIAGNOSIS — Z880 Allergy status to penicillin: Secondary | ICD-10-CM | POA: Insufficient documentation

## 2015-01-06 DIAGNOSIS — N183 Chronic kidney disease, stage 3 (moderate): Secondary | ICD-10-CM | POA: Insufficient documentation

## 2015-01-06 DIAGNOSIS — Z8739 Personal history of other diseases of the musculoskeletal system and connective tissue: Secondary | ICD-10-CM | POA: Insufficient documentation

## 2015-01-06 DIAGNOSIS — S59912A Unspecified injury of left forearm, initial encounter: Secondary | ICD-10-CM | POA: Diagnosis present

## 2015-01-06 DIAGNOSIS — S50852A Superficial foreign body of left forearm, initial encounter: Secondary | ICD-10-CM | POA: Insufficient documentation

## 2015-01-06 DIAGNOSIS — Y9289 Other specified places as the place of occurrence of the external cause: Secondary | ICD-10-CM | POA: Diagnosis not present

## 2015-01-06 DIAGNOSIS — Y9389 Activity, other specified: Secondary | ICD-10-CM | POA: Insufficient documentation

## 2015-01-06 DIAGNOSIS — E079 Disorder of thyroid, unspecified: Secondary | ICD-10-CM | POA: Insufficient documentation

## 2015-01-06 DIAGNOSIS — Z7952 Long term (current) use of systemic steroids: Secondary | ICD-10-CM | POA: Diagnosis not present

## 2015-01-06 DIAGNOSIS — J45909 Unspecified asthma, uncomplicated: Secondary | ICD-10-CM | POA: Insufficient documentation

## 2015-01-06 DIAGNOSIS — Y998 Other external cause status: Secondary | ICD-10-CM | POA: Diagnosis not present

## 2015-01-06 DIAGNOSIS — M795 Residual foreign body in soft tissue: Secondary | ICD-10-CM
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
## 2015-03-11 ENCOUNTER — Emergency Department (HOSPITAL_BASED_OUTPATIENT_CLINIC_OR_DEPARTMENT_OTHER)
Admission: EM | Admit: 2015-03-11 | Discharge: 2015-03-11 | Disposition: A | Discharge status: Home / Self Care | Payer: Medicaid Other | Attending: Emergency Medicine | Admitting: Emergency Medicine

## 2015-03-11 ENCOUNTER — Encounter (HOSPITAL_BASED_OUTPATIENT_CLINIC_OR_DEPARTMENT_OTHER): Payer: Self-pay

## 2015-03-11 ENCOUNTER — Emergency Department (HOSPITAL_BASED_OUTPATIENT_CLINIC_OR_DEPARTMENT_OTHER): Payer: Medicaid Other

## 2015-03-11 DIAGNOSIS — E079 Disorder of thyroid, unspecified: Secondary | ICD-10-CM | POA: Insufficient documentation

## 2015-03-11 DIAGNOSIS — Z880 Allergy status to penicillin: Secondary | ICD-10-CM | POA: Insufficient documentation

## 2015-03-11 DIAGNOSIS — Z87448 Personal history of other diseases of urinary system: Secondary | ICD-10-CM | POA: Diagnosis not present

## 2015-03-11 DIAGNOSIS — Z79899 Other long term (current) drug therapy: Secondary | ICD-10-CM | POA: Diagnosis not present

## 2015-03-11 DIAGNOSIS — G8929 Other chronic pain: Secondary | ICD-10-CM | POA: Diagnosis not present

## 2015-03-11 DIAGNOSIS — G43909 Migraine, unspecified, not intractable, without status migrainosus: Secondary | ICD-10-CM | POA: Insufficient documentation

## 2015-03-11 DIAGNOSIS — J45909 Unspecified asthma, uncomplicated: Secondary | ICD-10-CM | POA: Diagnosis not present

## 2015-03-11 DIAGNOSIS — M25571 Pain in right ankle and joints of right foot: Secondary | ICD-10-CM | POA: Diagnosis present

## 2015-03-11 DIAGNOSIS — I10 Essential (primary) hypertension: Secondary | ICD-10-CM | POA: Insufficient documentation

## 2015-03-11 DIAGNOSIS — M19071 Primary osteoarthritis, right ankle and foot: Secondary | ICD-10-CM | POA: Diagnosis not present

## 2015-03-11 MED ORDER — ACETAMINOPHEN 500 MG PO TABS
1000.0000 mg | ORAL_TABLET | Freq: Once | ORAL | Status: AC
  Administered 2015-03-11: 1000 mg via ORAL
  Filled 2015-03-11: qty 2

## 2015-03-11 NOTE — ED Notes (Signed)
Right ankle pain x 2 days-denies injury-states "i think it's my tendonitis"

## 2015-03-11 NOTE — ED Provider Notes (Signed)
CSN: [id number]     Arrival date & time 03/11/15  1516 History   First MD Initiated Contact with Patient 03/11/15 1609     Chief Complaint  Patient presents with  . Ankle Pain     (Consider location/radiation/quality/duration/timing/severity/associated sxs/prior Treatment) HPI  Dawn Thomas This 39 year old morbidly obese female who presents emergency with chief complaint of right ankle pain. She has a previous diagnosis of tendinitis in the right ankle. She states that she has some chronic discomfort in that ankle. However, over the past few days it has been worsening. Yesterday she worked 11 hours standing on the ankle and had severe pain in the ankle. She states that she is almost unable to walk on it. She denies calf pain, unilateral swelling or history of confinement. Patient denies injury to the ankle. She states that her pain is predominantly on the medial aspect of her ankle. It hurts to wiggle her toes or bend her ankle in any direction. She denies numbness or tingling.  Past Medical History  Diagnosis Date  . Hypertension   . Migraines   . Tendonitis   . Asthma   . Thyroid disease   . Renal disorder     stage 3   Past Surgical History  Procedure Laterality Date  . Cesarean section    . Cholecystectomy    . Dilation and curettage of uterus     Family History  Problem Relation Age of Onset  . Diabetes Mother   . Hypertension Mother   . Hypertension Father   . Diabetes Father   . Sudden death Father   . Heart attack Neg Hx   . Hyperlipidemia Neg Hx    Social History  Substance Use Topics  . Smoking status: Never Smoker   . Smokeless tobacco: None  . Alcohol Use: No   OB History    No data available     Review of Systems  Constitutional: Negative for fever and chills.  Musculoskeletal: Positive for joint swelling and gait problem.  Skin: Negative for color change, pallor and rash.  Neurological: Negative for numbness.  Hematological: Does not  bruise/bleed easily.  All other systems reviewed and are negative.     Allergies  Maxalt; Adhesive; Hydrocodone; Penicillins; and Zithromax  Home Medications   Prior to Admission medications   Medication Sig Start Date End Date Taking? Authorizing Provider  albuterol (PROVENTIL HFA;VENTOLIN HFA) 108 (90 BASE) MCG/ACT inhaler Inhale 2 puffs into the lungs every 6 (six) hours as needed. For shortness of breath    Historical Provider, MD  cloNIDine (CATAPRES) 0.1 MG tablet Take 0.2 mg by mouth 2 (two) times daily.    Historical Provider, MD  LEVOTHYROXINE SODIUM PO Take 75 mcg by mouth.     Historical Provider, MD  losartan-hydrochlorothiazide (HYZAAR) 100-12.5 MG per tablet Take 1 tablet by mouth daily.      Historical Provider, MD  METOPROLOL TARTRATE PO Take by mouth.    Historical Provider, MD   BP 187/85 mmHg  Pulse 75  Temp(Src) 98.2 F (36.8 C) (Oral)  Resp 18  Ht 5\' 5"  (1.651 m)  Wt 385 lb (174.635 kg)  BMI 64.07 kg/m2  SpO2 99%  LMP 01/25/2015 Physical Exam  Constitutional: She is oriented to person, place, and time. She appears well-developed and well-nourished. No distress.  HENT:  Head: Normocephalic and atraumatic.  Eyes: Conjunctivae are normal. No scleral icterus.  Neck: Normal range of motion.  Cardiovascular: Normal rate, regular rhythm and normal heart sounds.  Exam reveals no gallop and no friction rub.   No murmur heard. Pulmonary/Chest: Effort normal and breath sounds normal. No respiratory distress.  Abdominal: Soft. Bowel sounds are normal. She exhibits no distension and no mass. There is no tenderness. There is no guarding.  Musculoskeletal:  Right ankle exam reveals mild swelling of the ankle, tenderness to palpation behind the bilateral malleoli. Pulses intact. Pain with flexion and inversion of the ankle. Neurovascularly intact.  Neurological: She is alert and oriented to person, place, and time.  Skin: Skin is warm and dry. She is not diaphoretic.   Nursing note and vitals reviewed.   ED Course  Procedures (including critical care time) Labs Review Labs Reviewed - No data to display  Imaging Review No results found.   EKG Interpretation None      MDM   Final diagnoses:  Osteoarthritis of right ankle, unspecified osteoarthritis type    Patient with ankle pain. Noted injury, mild swelling, no calf tenderness or suspicion for DVT. We'll obtain an x-ray.  X-ray shows osteoarthritis, advanced degenerative changes in the ankle. He'll have orthopedic devices, which will perfectly fit the patient. Patient given Ace wrap, crutches, or for follow-up. She has CK B stage III and have advised Tylenol only, rest, ice, 2 days out of work with elevation. Patient agrees with plan of care. Discussed return precautions.  Margarita Mail, PA-C 03/11/15 2152  Debby Freiberg, MD 03/12/15 [phone]

## 2015-03-11 NOTE — Discharge Instructions (Signed)
Your xray shows osteoarthritis of the ankle. You will need to follow up with an orthopedic specialist for an appropriate ankle brace. Take tylenol for pain. Ice and elevate your ankle.  Arthritis, Nonspecific Arthritis is inflammation of a joint. This usually means pain, redness, warmth or swelling are present. One or more joints may be involved. There are a number of types of arthritis. Your caregiver may not be able to tell what type of arthritis you have right away. CAUSES  The most common cause of arthritis is the wear and tear on the joint (osteoarthritis). This causes damage to the cartilage, which can break down over time. The knees, hips, back and neck are most often affected by this type of arthritis. Other types of arthritis and common causes of joint pain include:  Sprains and other injuries near the joint. Sometimes minor sprains and injuries cause pain and swelling that develop hours later.  Rheumatoid arthritis. This affects hands, feet and knees. It usually affects both sides of your body at the same time. It is often associated with chronic ailments, fever, weight loss and general weakness.  Crystal arthritis. Gout and pseudo gout can cause occasional acute severe pain, redness and swelling in the foot, ankle, or knee.  Infectious arthritis. Bacteria can get into a joint through a break in overlying skin. This can cause infection of the joint. Bacteria and viruses can also spread through the blood and affect your joints.  Drug, infectious and allergy reactions. Sometimes joints can become mildly painful and slightly swollen with these types of illnesses. SYMPTOMS   Pain is the main symptom.  Your joint or joints can also be red, swollen and warm or hot to the touch.  You may have a fever with certain types of arthritis, or even feel overall ill.  The joint with arthritis will hurt with movement. Stiffness is present with some types of arthritis. DIAGNOSIS  Your caregiver  will suspect arthritis based on your description of your symptoms and on your exam. Testing may be needed to find the type of arthritis:  Blood and sometimes urine tests.  X-ray tests and sometimes CT or MRI scans.  Removal of fluid from the joint (arthrocentesis) is done to check for bacteria, crystals or other causes. Your caregiver (or a specialist) will numb the area over the joint with a local anesthetic, and use a needle to remove joint fluid for examination. This procedure is only minimally uncomfortable.  Even with these tests, your caregiver may not be able to tell what kind of arthritis you have. Consultation with a specialist (rheumatologist) may be helpful. TREATMENT  Your caregiver will discuss with you treatment specific to your type of arthritis. If the specific type cannot be determined, then the following general recommendations may apply. Treatment of severe joint pain includes:  Rest.  Elevation.  Anti-inflammatory medication (for example, ibuprofen) may be prescribed. Avoiding activities that cause increased pain.  Only take over-the-counter or prescription medicines for pain and discomfort as recommended by your caregiver.  Cold packs over an inflamed joint may be used for 10 to 15 minutes every hour. Hot packs sometimes feel better, but do not use overnight. Do not use hot packs if you are diabetic without your caregiver's permission.  A cortisone shot into arthritic joints may help reduce pain and swelling.  Any acute arthritis that gets worse over the next 1 to 2 days needs to be looked at to be sure there is no joint infection. Long-term arthritis treatment  involves modifying activities and lifestyle to reduce joint stress jarring. This can include weight loss. Also, exercise is needed to nourish the joint cartilage and remove waste. This helps keep the muscles around the joint strong. HOME CARE INSTRUCTIONS   Do not take aspirin to relieve pain if gout is  suspected. This elevates uric acid levels.  Only take over-the-counter or prescription medicines for pain, discomfort or fever as directed by your caregiver.  Rest the joint as much as possible.  If your joint is swollen, keep it elevated.  Use crutches if the painful joint is in your leg.  Drinking plenty of fluids may help for certain types of arthritis.  Follow your caregiver's dietary instructions.  Try low-impact exercise such as:  Swimming.  Water aerobics.  Biking.  Walking.  Morning stiffness is often relieved by a warm shower.  Put your joints through regular range-of-motion. SEEK MEDICAL CARE IF:   You do not feel better in 24 hours or are getting worse.  You have side effects to medications, or are not getting better with treatment. SEEK IMMEDIATE MEDICAL CARE IF:   You have a fever.  You develop severe joint pain, swelling or redness.  Many joints are involved and become painful and swollen.  There is severe back pain and/or leg weakness.  You have loss of bowel or bladder control. Document Released: 08/25/2004 Document Revised: 10/10/2011 Document Reviewed: 09/10/2008 [facility] Patient Information 2015 [place], [place]. This information is not intended to replace advice given to you by your health care provider. Make sure you discuss any questions you have with your health care provider.

## 2015-03-11 NOTE — ED Notes (Signed)
Pa  at bedside. 

## 2015-04-09 ENCOUNTER — Encounter (HOSPITAL_BASED_OUTPATIENT_CLINIC_OR_DEPARTMENT_OTHER): Payer: Self-pay | Admitting: *Deleted

## 2015-04-09 ENCOUNTER — Emergency Department (HOSPITAL_BASED_OUTPATIENT_CLINIC_OR_DEPARTMENT_OTHER)
Admission: EM | Admit: 2015-04-09 | Discharge: 2015-04-09 | Disposition: A | Discharge status: Home / Self Care | Payer: Medicaid Other | Attending: Emergency Medicine | Admitting: Emergency Medicine

## 2015-04-09 DIAGNOSIS — Z79899 Other long term (current) drug therapy: Secondary | ICD-10-CM | POA: Insufficient documentation

## 2015-04-09 DIAGNOSIS — M25512 Pain in left shoulder: Secondary | ICD-10-CM | POA: Diagnosis not present

## 2015-04-09 DIAGNOSIS — E079 Disorder of thyroid, unspecified: Secondary | ICD-10-CM | POA: Diagnosis not present

## 2015-04-09 DIAGNOSIS — M25561 Pain in right knee: Secondary | ICD-10-CM | POA: Diagnosis not present

## 2015-04-09 DIAGNOSIS — I129 Hypertensive chronic kidney disease with stage 1 through stage 4 chronic kidney disease, or unspecified chronic kidney disease: Secondary | ICD-10-CM | POA: Insufficient documentation

## 2015-04-09 DIAGNOSIS — J45909 Unspecified asthma, uncomplicated: Secondary | ICD-10-CM | POA: Diagnosis not present

## 2015-04-09 DIAGNOSIS — M25571 Pain in right ankle and joints of right foot: Secondary | ICD-10-CM | POA: Diagnosis present

## 2015-04-09 DIAGNOSIS — N183 Chronic kidney disease, stage 3 (moderate): Secondary | ICD-10-CM | POA: Insufficient documentation

## 2015-04-09 DIAGNOSIS — Z880 Allergy status to penicillin: Secondary | ICD-10-CM | POA: Diagnosis not present

## 2015-04-09 MED ORDER — TRAMADOL HCL 50 MG PO TABS: 50.0000 mg | ORAL_TABLET | Freq: Four times a day (QID) | ORAL | Status: DC | PRN

## 2015-04-09 NOTE — Discharge Instructions (Signed)
Tramadol as prescribed as needed for pain.  Follow-up with your primary Dr. to discuss referrals to specialists at their discretion.   Arthritis, Nonspecific Arthritis is inflammation of a joint. This usually means pain, redness, warmth or swelling are present. One or more joints may be involved. There are a number of types of arthritis. Your caregiver may not be able to tell what type of arthritis you have right away. CAUSES  The most common cause of arthritis is the wear and tear on the joint (osteoarthritis). This causes damage to the cartilage, which can break down over time. The knees, hips, back and neck are most often affected by this type of arthritis. Other types of arthritis and common causes of joint pain include:  Sprains and other injuries near the joint. Sometimes minor sprains and injuries cause pain and swelling that develop hours later.  Rheumatoid arthritis. This affects hands, feet and knees. It usually affects both sides of your body at the same time. It is often associated with chronic ailments, fever, weight loss and general weakness.  Crystal arthritis. Gout and pseudo gout can cause occasional acute severe pain, redness and swelling in the foot, ankle, or knee.  Infectious arthritis. Bacteria can get into a joint through a break in overlying skin. This can cause infection of the joint. Bacteria and viruses can also spread through the blood and affect your joints.  Drug, infectious and allergy reactions. Sometimes joints can become mildly painful and slightly swollen with these types of illnesses. SYMPTOMS   Pain is the main symptom.  Your joint or joints can also be red, swollen and warm or hot to the touch.  You may have a fever with certain types of arthritis, or even feel overall ill.  The joint with arthritis will hurt with movement. Stiffness is present with some types of arthritis. DIAGNOSIS  Your caregiver will suspect arthritis based on your description  of your symptoms and on your exam. Testing may be needed to find the type of arthritis:  Blood and sometimes urine tests.  X-ray tests and sometimes CT or MRI scans.  Removal of fluid from the joint (arthrocentesis) is done to check for bacteria, crystals or other causes. Your caregiver (or a specialist) will numb the area over the joint with a local anesthetic, and use a needle to remove joint fluid for examination. This procedure is only minimally uncomfortable.  Even with these tests, your caregiver may not be able to tell what kind of arthritis you have. Consultation with a specialist (rheumatologist) may be helpful. TREATMENT  Your caregiver will discuss with you treatment specific to your type of arthritis. If the specific type cannot be determined, then the following general recommendations may apply. Treatment of severe joint pain includes:  Rest.  Elevation.  Anti-inflammatory medication (for example, ibuprofen) may be prescribed. Avoiding activities that cause increased pain.  Only take over-the-counter or prescription medicines for pain and discomfort as recommended by your caregiver.  Cold packs over an inflamed joint may be used for 10 to 15 minutes every hour. Hot packs sometimes feel better, but do not use overnight. Do not use hot packs if you are diabetic without your caregiver's permission.  A cortisone shot into arthritic joints may help reduce pain and swelling.  Any acute arthritis that gets worse over the next 1 to 2 days needs to be looked at to be sure there is no joint infection. Long-term arthritis treatment involves modifying activities and lifestyle to reduce joint stress  jarring. This can include weight loss. Also, exercise is needed to nourish the joint cartilage and remove waste. This helps keep the muscles around the joint strong. HOME CARE INSTRUCTIONS   Do not take aspirin to relieve pain if gout is suspected. This elevates uric acid levels.  Only take  over-the-counter or prescription medicines for pain, discomfort or fever as directed by your caregiver.  Rest the joint as much as possible.  If your joint is swollen, keep it elevated.  Use crutches if the painful joint is in your leg.  Drinking plenty of fluids may help for certain types of arthritis.  Follow your caregiver's dietary instructions.  Try low-impact exercise such as:  Swimming.  Water aerobics.  Biking.  Walking.  Morning stiffness is often relieved by a warm shower.  Put your joints through regular range-of-motion. SEEK MEDICAL CARE IF:   You do not feel better in 24 hours or are getting worse.  You have side effects to medications, or are not getting better with treatment. SEEK IMMEDIATE MEDICAL CARE IF:   You have a fever.  You develop severe joint pain, swelling or redness.  Many joints are involved and become painful and swollen.  There is severe back pain and/or leg weakness.  You have loss of bowel or bladder control. Document Released: 08/25/2004 Document Revised: 10/10/2011 Document Reviewed: 09/10/2008 [facility] Patient Information 2015 [place], [place]. This information is not intended to replace advice given to you by your health care provider. Make sure you discuss any questions you have with your health care provider.

## 2015-04-09 NOTE — ED Notes (Signed)
Pain in her right knee and ankle. Pain in her left shoulder. Pain has been evaluated and she has been told it may be arthritis.

## 2015-04-09 NOTE — ED Notes (Signed)
Dr. Delo at [facility].  

## 2015-04-09 NOTE — ED Provider Notes (Signed)
CSN: [id number]     Arrival date & time 04/09/15  2056 History  This chart was scribed for Veryl Speak, MD by Hansel Feinstein, ED Scribe. This patient was seen in room MHT13/MHT13 and the patient's care was started at 11:03 PM.     Chief Complaint  Patient presents with  . Knee Pain  . Ankle Pain  . Shoulder Pain   Patient is a 39 y.o. female presenting with knee pain and ankle pain. The history is provided by the patient. No language interpreter was used.  Knee Pain Location:  Knee Time since incident:  3 days Injury: no   Knee location:  R knee Pain details:    Quality:  Shooting   Severity:  Moderate   Onset quality:  Gradual   Duration:  3 days   Timing:  Constant Chronicity:  New Dislocation: no   Prior injury to area:  No Worsened by:  Bearing weight and activity Ineffective treatments:  None tried Associated symptoms: decreased ROM   Ankle Pain Pain details:    Quality:  Shooting   Severity:  Moderate   Progression:  Unchanged Worsened by:  Bearing weight and activity Ineffective treatments:  None tried Associated symptoms: decreased ROM     HPI Comments: Dawn Thomas is a 39 y.o. female who presents to the Emergency Department complaining of moderate, shooting, intermittent right knee, right ankle onset one month ago and worsened to be constant 3 days ago. Pt reports that her ankle pain is unchanged and her knee pain is new. She states that pain is worsened with walking, bearing weight. Pt was seen on 03/11/15 in the ED for the same ankle pain. She notes that she usually wears an ankle brace and went to work for 12 hours without it, exacerbating her symptoms. She notes that she is waiting for a referral to an orthopedist from her PCP. Pt denies recent trauma, injury, heavy lifting, falls.    Past Medical History  Diagnosis Date  . Hypertension   . Migraines   . Tendonitis   . Asthma   . Thyroid disease   . Renal disorder     stage 3   Past Surgical History   Procedure Laterality Date  . Cesarean section    . Cholecystectomy    . Dilation and curettage of uterus     Family History  Problem Relation Age of Onset  . Diabetes Mother   . Hypertension Mother   . Hypertension Father   . Diabetes Father   . Sudden death Father   . Heart attack Neg Hx   . Hyperlipidemia Neg Hx    Social History  Substance Use Topics  . Smoking status: Never Smoker   . Smokeless tobacco: None  . Alcohol Use: No   OB History    No data available     Review of Systems  Musculoskeletal: Positive for arthralgias.  All other systems reviewed and are negative.  Allergies  Maxalt; Adhesive; Hydrocodone; Penicillins; and Zithromax  Home Medications   Prior to Admission medications   Medication Sig Start Date End Date Taking? Authorizing Provider  albuterol (PROVENTIL HFA;VENTOLIN HFA) 108 (90 BASE) MCG/ACT inhaler Inhale 2 puffs into the lungs every 6 (six) hours as needed. For shortness of breath    Historical Provider, MD  cloNIDine (CATAPRES) 0.1 MG tablet Take 0.2 mg by mouth 2 (two) times daily.    Historical Provider, MD  LEVOTHYROXINE SODIUM PO Take 75 mcg by mouth.  Historical Provider, MD  losartan-hydrochlorothiazide (HYZAAR) 100-12.5 MG per tablet Take 1 tablet by mouth daily.      Historical Provider, MD  METOPROLOL TARTRATE PO Take by mouth.    Historical Provider, MD   BP 178/109 mmHg  Pulse 68  Temp(Src) 98.2 F (36.8 C) (Oral)  Resp 20  Ht 5' 6.5" (1.689 m)  Wt 385 lb (174.635 kg)  BMI 61.22 kg/m2  SpO2 98%  LMP 03/27/2015 Physical Exam  Constitutional: She is oriented to person, place, and time. She appears well-developed and well-nourished.  HENT:  Head: Normocephalic and atraumatic.  Eyes: Conjunctivae and EOM are normal. Pupils are equal, round, and reactive to light.  Neck: Normal range of motion. Neck supple.  Cardiovascular: Normal rate.   Pulmonary/Chest: Effort normal. No respiratory distress.  Abdominal: She  exhibits no distension.  Musculoskeletal: Normal range of motion. She exhibits tenderness.  Right ankle appears grossly normal. There is some pain with ROM, but no redness or warmth.    The right knee exam is somewhat obscured due to obesity. She has good ROM with no crepitus. The knee appears stable ap/laterally.   Neurological: She is alert and oriented to person, place, and time.  Skin: Skin is warm and dry.  Psychiatric: She has a normal mood and affect. Her behavior is normal.  Nursing note and vitals reviewed.   ED Course  Procedures (including critical care time) DIAGNOSTIC STUDIES: Oxygen Saturation is 98% on RA, normal by my interpretation.    COORDINATION OF CARE: 11:09 PM Discussed treatment plan with pt at bedside and pt agreed to plan.   Labs Review Labs Reviewed - No data to display  Imaging Review No results found. I have personally reviewed and evaluated these images and lab results as part of my medical decision-making.   EKG Interpretation None      MDM   Final diagnoses:  None    Patient presents with pain in multiple joints. She was seen here before for similar complaints and had x-rays revealing osteoarthritis. The patient is 39 years old, however she weighs 385 pounds and I suspect this is the cause of her pain. I have advised her to follow-up with her primary Dr. to discuss weight loss. She will be prescribed tramadol which she can take for her pain. She also tells me her doctor is arranging follow-up with an orthopedist. She is to call tomorrow to see where that stands. I see nothing emergent and do not feel as though any further testing or intervention is warranted.  I personally performed the services described in this documentation, which was scribed in my presence. The recorded information has been reviewed and is accurate.       Veryl Speak, MD 04/10/15 [phone]

## 2015-04-24 ENCOUNTER — Emergency Department (HOSPITAL_BASED_OUTPATIENT_CLINIC_OR_DEPARTMENT_OTHER)
Admission: EM | Admit: 2015-04-24 | Discharge: 2015-04-24 | Disposition: A | Discharge status: Home / Self Care | Payer: Medicaid Other | Attending: Emergency Medicine | Admitting: Emergency Medicine

## 2015-04-24 ENCOUNTER — Emergency Department (HOSPITAL_BASED_OUTPATIENT_CLINIC_OR_DEPARTMENT_OTHER): Payer: Medicaid Other

## 2015-04-24 ENCOUNTER — Encounter (HOSPITAL_BASED_OUTPATIENT_CLINIC_OR_DEPARTMENT_OTHER): Payer: Self-pay | Admitting: Emergency Medicine

## 2015-04-24 DIAGNOSIS — E669 Obesity, unspecified: Secondary | ICD-10-CM | POA: Insufficient documentation

## 2015-04-24 DIAGNOSIS — N183 Chronic kidney disease, stage 3 (moderate): Secondary | ICD-10-CM | POA: Diagnosis not present

## 2015-04-24 DIAGNOSIS — R0789 Other chest pain: Secondary | ICD-10-CM

## 2015-04-24 DIAGNOSIS — E079 Disorder of thyroid, unspecified: Secondary | ICD-10-CM | POA: Insufficient documentation

## 2015-04-24 DIAGNOSIS — Z8739 Personal history of other diseases of the musculoskeletal system and connective tissue: Secondary | ICD-10-CM | POA: Diagnosis not present

## 2015-04-24 DIAGNOSIS — Z79899 Other long term (current) drug therapy: Secondary | ICD-10-CM | POA: Insufficient documentation

## 2015-04-24 DIAGNOSIS — J45909 Unspecified asthma, uncomplicated: Secondary | ICD-10-CM | POA: Insufficient documentation

## 2015-04-24 DIAGNOSIS — R079 Chest pain, unspecified: Secondary | ICD-10-CM | POA: Diagnosis present

## 2015-04-24 DIAGNOSIS — I129 Hypertensive chronic kidney disease with stage 1 through stage 4 chronic kidney disease, or unspecified chronic kidney disease: Secondary | ICD-10-CM | POA: Insufficient documentation

## 2015-04-24 DIAGNOSIS — I10 Essential (primary) hypertension: Secondary | ICD-10-CM

## 2015-04-24 DIAGNOSIS — Z880 Allergy status to penicillin: Secondary | ICD-10-CM | POA: Diagnosis not present

## 2015-04-24 MED ORDER — OXYCODONE-ACETAMINOPHEN 5-325 MG PO TABS: 1.0000 | ORAL_TABLET | Freq: Four times a day (QID) | ORAL | Status: DC | PRN

## 2015-04-24 MED ORDER — ACETAMINOPHEN 325 MG PO TABS
ORAL_TABLET | ORAL | Status: AC
  Filled 2015-04-24: qty 2

## 2015-04-24 MED ORDER — ACETAMINOPHEN 325 MG PO TABS
650.0000 mg | ORAL_TABLET | Freq: Once | ORAL | Status: AC
  Administered 2015-04-24: 650 mg via ORAL

## 2015-04-24 MED ORDER — OXYCODONE-ACETAMINOPHEN 5-325 MG PO TABS
1.0000 | ORAL_TABLET | Freq: Once | ORAL | Status: DC
  Filled 2015-04-24: qty 1

## 2015-04-24 NOTE — ED Notes (Signed)
Hurts to take a deep breath and move to opposite side.

## 2015-04-24 NOTE — ED Provider Notes (Signed)
CSN: [id number]     Arrival date & time 04/24/15  0106 History   First MD Initiated Contact with Patient 04/24/15 0126     Chief Complaint  Patient presents with  . Chest Pain     (Consider location/radiation/quality/duration/timing/severity/associated sxs/prior Treatment) HPI  This is a 39 year old female who presents with left chest pain. Patient reports onset of symptoms at approximately 9 PM. She states that she got up from a table and noted sharp left-sided chest pain that was worse with movement, laughing, coughing, and breathing. She has not taken anything for the pain. The pain comes and goes. She denies any shortness of breath, cough, or fever.  Denies injury.  Current pain is 8 out of 10. Patient denies any history of blood clots, leg swelling, recent hospitalization, recent travel. Denies any history of hyperlipidemia or diabetes. Does have a history of hypertension. No early family history of heart disease.  Past Medical History  Diagnosis Date  . Hypertension   . Migraines   . Tendonitis   . Asthma   . Thyroid disease   . Renal disorder     stage 3   Past Surgical History  Procedure Laterality Date  . Cesarean section    . Cholecystectomy    . Dilation and curettage of uterus     Family History  Problem Relation Age of Onset  . Diabetes Mother   . Hypertension Mother   . Hypertension Father   . Diabetes Father   . Sudden death Father   . Heart attack Neg Hx   . Hyperlipidemia Neg Hx    Social History  Substance Use Topics  . Smoking status: Never Smoker   . Smokeless tobacco: None  . Alcohol Use: No   OB History    No data available     Review of Systems  Constitutional: Negative for fever.  Respiratory: Negative for cough, chest tightness and shortness of breath.   Cardiovascular: Positive for chest pain. Negative for palpitations and leg swelling.  Gastrointestinal: Negative for nausea, vomiting and abdominal pain.  Genitourinary: Negative for  dysuria.  Musculoskeletal: Negative for back pain.  Neurological: Negative for headaches.  All other systems reviewed and are negative.     Allergies  Maxalt; Adhesive; Hydrocodone; Penicillins; and Zithromax  Home Medications   Prior to Admission medications   Medication Sig Start Date End Date Taking? Authorizing Provider  albuterol (PROVENTIL HFA;VENTOLIN HFA) 108 (90 BASE) MCG/ACT inhaler Inhale 2 puffs into the lungs every 6 (six) hours as needed. For shortness of breath    Historical Provider, MD  cloNIDine (CATAPRES) 0.1 MG tablet Take 0.2 mg by mouth 2 (two) times daily.    Historical Provider, MD  LEVOTHYROXINE SODIUM PO Take 75 mcg by mouth.     Historical Provider, MD  losartan-hydrochlorothiazide (HYZAAR) 100-12.5 MG per tablet Take 1 tablet by mouth daily.      Historical Provider, MD  METOPROLOL TARTRATE PO Take by mouth.    Historical Provider, MD  oxyCODONE-acetaminophen (PERCOCET/ROXICET) 5-325 MG per tablet Take 1 tablet by mouth every 6 (six) hours as needed for severe pain. 04/24/15   Merryl Hacker, MD  traMADol (ULTRAM) 50 MG tablet Take 1 tablet (50 mg total) by mouth every 6 (six) hours as needed. 04/09/15   Veryl Speak, MD   BP 196/108 mmHg  Pulse 65  Temp(Src) 98 F (36.7 C) (Oral)  Resp 18  Ht 5\' 6"  (1.676 m)  Wt 385 lb (174.635 kg)  BMI  62.17 kg/m2  SpO2 100%  LMP 03/27/2015 Physical Exam  Constitutional: She is oriented to person, place, and time. She appears well-developed and well-nourished.  Obese  HENT:  Head: Normocephalic and atraumatic.  Cardiovascular: Normal rate, regular rhythm and normal heart sounds.   No murmur heard. Pulmonary/Chest: Effort normal and breath sounds normal. No respiratory distress. She has no wheezes. She exhibits tenderness.  Tenderness to palpation of the left chest wall, no crepitus, step-off or deformity noted, no bruising noted  Abdominal: Soft. Bowel sounds are normal. There is no tenderness. There is no  rebound.  Musculoskeletal:  1+ symmetric bilateral lower extremity edema, Ace bandage noted over the right foot, ankle brace left foot  Neurological: She is alert and oriented to person, place, and time.  Skin: Skin is warm and dry.  Psychiatric: She has a normal mood and affect.  Nursing note and vitals reviewed.   ED Course  Procedures (including critical care time) Labs Review Labs Reviewed - No data to display  Imaging Review Dg Chest 2 View  04/24/2015   CLINICAL DATA:  Initial evaluation for acute shortness of breath, left-sided chest pain.  EXAM: CHEST  2 VIEW  COMPARISON:  Prior radiograph from 10/04/2013.  FINDINGS: Mild cardiomegaly is stable from previous. Mediastinal silhouette within normal limits. Tortuosity the intrathoracic aorta noted.  Lungs are normally inflated. No focal infiltrate, pulmonary edema, or pleural effusion. There is no pneumothorax.  No acute osseus abnormality.  IMPRESSION: No active cardiopulmonary disease.   Electronically Signed   By: Jeannine Boga M.D.   On: 04/24/2015 02:33   I have personally reviewed and evaluated these images and lab results as part of my medical decision-making.   EKG Interpretation   Date/Time:  Friday April 24 2015 02:01:15 EDT Ventricular Rate:  55 PR Interval:  162 QRS Duration: 102 QT Interval:  470 QTC Calculation: 449 R Axis:   -30 Text Interpretation:  Sinus bradycardia Left axis deviation Abnormal ECG  Confirmed by HORTON  MD, COURTNEY ([id number]) on 04/24/2015 2:01:03 AM      MDM   Final diagnoses:  Chest wall pain  Essential hypertension     Patient presents with chest pain. Nontoxic on exam. Pain acute in onset. Worse with movements and laughing. Reproducible on exam. Atypical for ACS and heart score of 1 for 1-2 risk factors. EKG is nonischemic chest x-ray shows no evidence of pneumothorax or pneumonia. Suspect chest wall pain given reproducible nature pain. Patient was given Tylenol. She cannot  take anti-inflammatory. She was notably hypertensive. She normally takes clonidine when she gets off work at approximately 1 AM. She is not taken her clonidine today. Discussed with patient's the importance of taking her blood pressure medications. She is to follow-up closely with her primary doctor nephrologist. Patient stated understanding.  After history, exam, and medical workup I feel the patient has been appropriately medically screened and is safe for discharge home. Pertinent diagnoses were discussed with the patient. Patient was given return precautions.      Merryl Hacker, MD 04/24/15 [phone]

## 2015-04-24 NOTE — Discharge Instructions (Signed)
You were seen today for evaluation of chest wall pain. Your EKG and chest x-ray are reassuring. You may have pulled a muscle or have some inflammation in your chest wall.  You will be given pain medication. Your blood pressure was also noted to be high. You need to make sure to take her blood pressure medications. Follow-up with her primary physician.  Chest Wall Pain Chest wall pain is pain in or around the bones and muscles of your chest. It may take up to 6 weeks to get better. It may take longer if you must stay physically active in your work and activities.  CAUSES  Chest wall pain may happen on its own. However, it may be caused by:  A viral illness like the flu.  Injury.  Coughing.  Exercise.  Arthritis.  Fibromyalgia.  Shingles. HOME CARE INSTRUCTIONS   Avoid overtiring physical activity. Try not to strain or perform activities that cause pain. This includes any activities using your chest or your abdominal and side muscles, especially if heavy weights are used.  Put ice on the sore area.  Put ice in a plastic bag.  Place a towel between your skin and the bag.  Leave the ice on for 15-20 minutes per hour while awake for the first 2 days.  Only take over-the-counter or prescription medicines for pain, discomfort, or fever as directed by your caregiver. SEEK IMMEDIATE MEDICAL CARE IF:   Your pain increases, or you are very uncomfortable.  You have a fever.  Your chest pain becomes worse.  You have new, unexplained symptoms.  You have nausea or vomiting.  You feel sweaty or lightheaded.  You have a cough with phlegm (sputum), or you cough up blood. MAKE SURE YOU:   Understand these instructions.  Will watch your condition.  Will get help right away if you are not doing well or get worse. Document Released: 07/18/2005 Document Revised: 10/10/2011 Document Reviewed: 03/14/2011 [facility] Patient Information 2015 [place], [place]. This information is not  intended to replace advice given to you by your health care provider. Make sure you discuss any questions you have with your health care provider.

## 2015-10-14 ENCOUNTER — Encounter (HOSPITAL_BASED_OUTPATIENT_CLINIC_OR_DEPARTMENT_OTHER): Payer: Self-pay | Admitting: Emergency Medicine

## 2015-10-14 ENCOUNTER — Emergency Department (HOSPITAL_BASED_OUTPATIENT_CLINIC_OR_DEPARTMENT_OTHER)
Admission: EM | Admit: 2015-10-14 | Discharge: 2015-10-14 | Disposition: A | Discharge status: Home / Self Care | Payer: Medicaid Other | Attending: Emergency Medicine | Admitting: Emergency Medicine

## 2015-10-14 DIAGNOSIS — J45909 Unspecified asthma, uncomplicated: Secondary | ICD-10-CM | POA: Insufficient documentation

## 2015-10-14 DIAGNOSIS — H9209 Otalgia, unspecified ear: Secondary | ICD-10-CM | POA: Diagnosis not present

## 2015-10-14 DIAGNOSIS — R05 Cough: Secondary | ICD-10-CM | POA: Diagnosis present

## 2015-10-14 DIAGNOSIS — I129 Hypertensive chronic kidney disease with stage 1 through stage 4 chronic kidney disease, or unspecified chronic kidney disease: Secondary | ICD-10-CM | POA: Diagnosis not present

## 2015-10-14 DIAGNOSIS — J069 Acute upper respiratory infection, unspecified: Secondary | ICD-10-CM | POA: Diagnosis not present

## 2015-10-14 DIAGNOSIS — Z8739 Personal history of other diseases of the musculoskeletal system and connective tissue: Secondary | ICD-10-CM | POA: Diagnosis not present

## 2015-10-14 DIAGNOSIS — Z880 Allergy status to penicillin: Secondary | ICD-10-CM | POA: Insufficient documentation

## 2015-10-14 DIAGNOSIS — E079 Disorder of thyroid, unspecified: Secondary | ICD-10-CM | POA: Diagnosis not present

## 2015-10-14 DIAGNOSIS — N189 Chronic kidney disease, unspecified: Secondary | ICD-10-CM | POA: Insufficient documentation

## 2015-10-14 DIAGNOSIS — Z79899 Other long term (current) drug therapy: Secondary | ICD-10-CM | POA: Insufficient documentation

## 2015-10-14 MED ORDER — NAPROXEN 250 MG PO TABS: 250.0000 mg | ORAL_TABLET | Freq: Two times a day (BID) | ORAL | Status: DC

## 2015-10-14 MED ORDER — CETIRIZINE HCL 10 MG PO TABS: 10.0000 mg | ORAL_TABLET | Freq: Every day | ORAL | Status: DC

## 2015-10-14 MED ORDER — FLUTICASONE PROPIONATE 50 MCG/ACT NA SUSP: 2.0000 | Freq: Every day | NASAL | Status: DC

## 2015-10-14 NOTE — ED Provider Notes (Signed)
CSN: [id number]     Arrival date & time 10/14/15  1958 History   First MD Initiated Contact with Patient 10/14/15 2043     Chief Complaint  Patient presents with  . Cough    Winola Leisure is a 40 y.o. female Who presents to the emergency department complaining of 4 days of runny nose, nasal congestion, postnasal drip and slight cough. Patient also reports she has had some ear pain and pressure bilaterally starting today. She has taken nothing for treatment of her symptoms today. She denies fevers, sore throat, trouble swallowing, trouble breathing, shortness of breath, chest pain, vomiting, diarrhea, rashes or ear discharge.  Patient is a 40 y.o. female presenting with cough. The history is provided by the patient. No language interpreter was used.  Cough Associated symptoms: ear pain and rhinorrhea   Associated symptoms: no chest pain, no chills, no fever, no headaches, no rash, no shortness of breath, no sore throat and no wheezing     Past Medical History  Diagnosis Date  . Hypertension   . Migraines   . Tendonitis   . Asthma   . Thyroid disease   . Renal disorder     stage 3   Past Surgical History  Procedure Laterality Date  . Cesarean section    . Cholecystectomy    . Dilation and curettage of uterus     Family History  Problem Relation Age of Onset  . Diabetes Mother   . Hypertension Mother   . Hypertension Father   . Diabetes Father   . Sudden death Father   . Heart attack Neg Hx   . Hyperlipidemia Neg Hx    Social History  Substance Use Topics  . Smoking status: Never Smoker   . Smokeless tobacco: None  . Alcohol Use: No   OB History    No data available     Review of Systems  Constitutional: Negative for fever and chills.  HENT: Positive for congestion, ear pain, postnasal drip, rhinorrhea, sinus pressure and sneezing. Negative for ear discharge, sore throat and trouble swallowing.   Eyes: Negative for visual disturbance.  Respiratory: Positive for  cough. Negative for shortness of breath and wheezing.   Cardiovascular: Negative for chest pain.  Gastrointestinal: Negative for nausea, vomiting, abdominal pain and diarrhea.  Genitourinary: Negative for dysuria.  Musculoskeletal: Negative for back pain and neck pain.  Skin: Negative for rash.  Neurological: Negative for headaches.      Allergies  Maxalt; Adhesive; Hydrocodone; Penicillins; and Zithromax  Home Medications   Prior to Admission medications   Medication Sig Start Date End Date Taking? Authorizing Provider  albuterol (PROVENTIL HFA;VENTOLIN HFA) 108 (90 BASE) MCG/ACT inhaler Inhale 2 puffs into the lungs every 6 (six) hours as needed. For shortness of breath    Historical Provider, MD  cetirizine (ZYRTEC ALLERGY) 10 MG tablet Take 1 tablet (10 mg total) by mouth daily. 10/14/15   Waynetta Pean, PA-C  cloNIDine (CATAPRES) 0.1 MG tablet Take 0.2 mg by mouth 2 (two) times daily.    Historical Provider, MD  fluticasone (FLONASE) 50 MCG/ACT nasal spray Place 2 sprays into both nostrils daily. 10/14/15   Waynetta Pean, PA-C  LEVOTHYROXINE SODIUM PO Take 75 mcg by mouth.     Historical Provider, MD  losartan-hydrochlorothiazide (HYZAAR) 100-12.5 MG per tablet Take 1 tablet by mouth daily.      Historical Provider, MD  METOPROLOL TARTRATE PO Take by mouth.    Historical Provider, MD  naproxen (NAPROSYN) 250  MG tablet Take 1 tablet (250 mg total) by mouth 2 (two) times daily with a meal. 10/14/15   Waynetta Pean, PA-C  oxyCODONE-acetaminophen (PERCOCET/ROXICET) 5-325 MG per tablet Take 1 tablet by mouth every 6 (six) hours as needed for severe pain. 04/24/15   Merryl Hacker, MD  traMADol (ULTRAM) 50 MG tablet Take 1 tablet (50 mg total) by mouth every 6 (six) hours as needed. 04/09/15   Veryl Speak, MD   BP 141/89 mmHg  Pulse 62  Temp(Src) 98 F (36.7 C) (Oral)  Resp 18  Ht 5\' 6"  (1.676 m)  Wt 154.677 kg  BMI 55.07 kg/m2  SpO2 100%  LMP 09/29/2015 Physical Exam   Constitutional: She appears well-developed and well-nourished. No distress.  Nontoxic appearing.  HENT:  Head: Normocephalic and atraumatic.  Right Ear: External ear normal.  Left Ear: External ear normal.  Mouth/Throat: Oropharynx is clear and moist.  Moderate middle ear effusion noted bilaterally. No TM erythema or loss of landmarks. Boggy nasal turbinates bilaterally. Mild tonsillar protrusion without exudates. Uvula is midline without edema. No peritonsillar abscess. No trismus. No drooling.  Eyes: Conjunctivae are normal. Pupils are equal, round, and reactive to light. Right eye exhibits no discharge. Left eye exhibits no discharge.  Neck: Normal range of motion. Neck supple.  Cardiovascular: Normal rate, regular rhythm, normal heart sounds and intact distal pulses.  Exam reveals no gallop and no friction rub.   No murmur heard. Pulmonary/Chest: Effort normal and breath sounds normal. No respiratory distress. She has no wheezes. She has no rales.  Lungs are clear to auscultation bilaterally.  Abdominal: Soft. There is no tenderness. There is no guarding.  Musculoskeletal: She exhibits no edema.  Lymphadenopathy:    She has no cervical adenopathy.  Neurological: She is alert. Coordination normal.  Skin: Skin is warm and dry. No rash noted. She is not diaphoretic. No erythema. No pallor.  Psychiatric: She has a normal mood and affect. Her behavior is normal.  Nursing note and vitals reviewed.   ED Course  Procedures (including critical care time) Labs Review Labs Reviewed - No data to display  Imaging Review No results found.    EKG Interpretation None      Filed Vitals:   10/14/15 2016  BP: 141/89  Pulse: 62  Temp: 98 F (36.7 C)  TempSrc: Oral  Resp: 18  Height: 5\' 6"  (1.676 m)  Weight: 154.677 kg  SpO2: 100%     MDM   Meds given in ED:  Medications - No data to display  New Prescriptions   CETIRIZINE (ZYRTEC ALLERGY) 10 MG TABLET    Take 1 tablet (10  mg total) by mouth daily.   FLUTICASONE (FLONASE) 50 MCG/ACT NASAL SPRAY    Place 2 sprays into both nostrils daily.   NAPROXEN (NAPROSYN) 250 MG TABLET    Take 1 tablet (250 mg total) by mouth 2 (two) times daily with a meal.    Final diagnoses:  URI (upper respiratory infection)    This is a 40 y.o. female Who presents to the emergency department complaining of 4 days of runny nose, nasal congestion, postnasal drip and slight cough. Patient also reports she has had some ear pain and pressure bilaterally starting today. On exam the patient is afebrile nontoxic appearing. She has a mild middle ear effusion noted bilaterally. Boggy nasal turbinates bilaterally. Lungs are clear to auscultation bilaterally. Patient with upper respiratory infection. Will start on Flonase, Zyrtec and naproxen. I encouraged her to push  oral fluids. I discussed return precautions. I advised the patient to follow-up with their primary care provider this week. I advised the patient to return to the emergency department with new or worsening symptoms or new concerns. The patient verbalized understanding and agreement with plan.    Waynetta Pean, PA-C 10/14/15 [place], MD 10/24/15 [phone]

## 2015-10-14 NOTE — ED Notes (Signed)
Provider at bedside

## 2015-10-14 NOTE — Discharge Instructions (Signed)
Upper Respiratory Infection, Adult Most upper respiratory infections (URIs) are a viral infection of the air passages leading to the lungs. A URI affects the nose, throat, and upper air passages. The most common type of URI is nasopharyngitis and is typically referred to as "the common cold." URIs run their course and usually go away on their own. Most of the time, a URI does not require medical attention, but sometimes a bacterial infection in the upper airways can follow a viral infection. This is called a secondary infection. Sinus and middle ear infections are common types of secondary upper respiratory infections. Bacterial pneumonia can also complicate a URI. A URI can worsen asthma and chronic obstructive pulmonary disease (COPD). Sometimes, these complications can require emergency medical care and may be life threatening.  CAUSES Almost all URIs are caused by viruses. A virus is a type of germ and can spread from one person to another.  RISKS FACTORS You may be at risk for a URI if:   You smoke.   You have chronic heart or lung disease.  You have a weakened defense (immune) system.   You are very young or very old.   You have nasal allergies or asthma.  You work in crowded or poorly ventilated areas.  You work in health care facilities or schools. SIGNS AND SYMPTOMS  Symptoms typically develop 2-3 days after you come in contact with a cold virus. Most viral URIs last 7-10 days. However, viral URIs from the influenza virus (flu virus) can last 14-18 days and are typically more severe. Symptoms may include:   Runny or stuffy (congested) nose.   Sneezing.   Cough.   Sore throat.   Headache.   Fatigue.   Fever.   Loss of appetite.   Pain in your forehead, behind your eyes, and over your cheekbones (sinus pain).  Muscle aches.  DIAGNOSIS  Your health care provider may diagnose a URI by:  Physical exam.  Tests to check that your symptoms are not due to  another condition such as:  Strep throat.  Sinusitis.  Pneumonia.  Asthma. TREATMENT  A URI goes away on its own with time. It cannot be cured with medicines, but medicines may be prescribed or recommended to relieve symptoms. Medicines may help:  Reduce your fever.  Reduce your cough.  Relieve nasal congestion. HOME CARE INSTRUCTIONS   Take medicines only as directed by your health care provider.   Gargle warm saltwater or take cough drops to comfort your throat as directed by your health care provider.  Use a warm mist humidifier or inhale steam from a shower to increase air moisture. This may make it easier to breathe.  Drink enough fluid to keep your urine clear or pale yellow.   Eat soups and other clear broths and maintain good nutrition.   Rest as needed.   Return to work when your temperature has returned to normal or as your health care provider advises. You may need to stay home longer to avoid infecting others. You can also use a face mask and careful hand washing to prevent spread of the virus.  Increase the usage of your inhaler if you have asthma.   Do not use any tobacco products, including cigarettes, chewing tobacco, or electronic cigarettes. If you need help quitting, ask your health care provider. PREVENTION  The best way to protect yourself from getting a cold is to practice good hygiene.   Avoid oral or hand contact with people with cold   symptoms.   Wash your hands often if contact occurs.  There is no clear evidence that vitamin C, vitamin E, echinacea, or exercise reduces the chance of developing a cold. However, it is always recommended to get plenty of rest, exercise, and practice good nutrition.  SEEK MEDICAL CARE IF:   You are getting worse rather than better.   Your symptoms are not controlled by medicine.   You have chills.  You have worsening shortness of breath.  You have brown or red mucus.  You have yellow or brown nasal  discharge.  You have pain in your face, especially when you bend forward.  You have a fever.  You have swollen neck glands.  You have pain while swallowing.  You have white areas in the back of your throat. SEEK IMMEDIATE MEDICAL CARE IF:   You have severe or persistent:  Headache.  Ear pain.  Sinus pain.  Chest pain.  You have chronic lung disease and any of the following:  Wheezing.  Prolonged cough.  Coughing up blood.  A change in your usual mucus.  You have a stiff neck.  You have changes in your:  Vision.  Hearing.  Thinking.  Mood. MAKE SURE YOU:   Understand these instructions.  Will watch your condition.  Will get help right away if you are not doing well or get worse.   This information is not intended to replace advice given to you by your health care provider. Make sure you discuss any questions you have with your health care provider.   Document Released: 01/11/2001 Document Revised: 12/02/2014 Document Reviewed: 10/23/2013 Elsevier Interactive Patient Education 2016 [employer]  

## 2015-10-14 NOTE — ED Notes (Signed)
Patient states that since Sunday that she has had nasal congestion and a cough. The patient reports that earlier today she has a sharp pain and a ringing to her ears that lasted about 10 minutes. The patient reports that now all she has is the congestion and ear ache and the loud ringing

## 2016-04-27 ENCOUNTER — Encounter (HOSPITAL_BASED_OUTPATIENT_CLINIC_OR_DEPARTMENT_OTHER): Payer: Self-pay

## 2016-04-27 ENCOUNTER — Emergency Department (HOSPITAL_BASED_OUTPATIENT_CLINIC_OR_DEPARTMENT_OTHER)
Admission: EM | Admit: 2016-04-27 | Discharge: 2016-04-27 | Disposition: A | Discharge status: Home / Self Care | Payer: Managed Care, Other (non HMO) | Attending: Emergency Medicine | Admitting: Emergency Medicine

## 2016-04-27 ENCOUNTER — Emergency Department (HOSPITAL_BASED_OUTPATIENT_CLINIC_OR_DEPARTMENT_OTHER): Payer: Managed Care, Other (non HMO)

## 2016-04-27 DIAGNOSIS — M25572 Pain in left ankle and joints of left foot: Secondary | ICD-10-CM | POA: Insufficient documentation

## 2016-04-27 DIAGNOSIS — I10 Essential (primary) hypertension: Secondary | ICD-10-CM | POA: Insufficient documentation

## 2016-04-27 DIAGNOSIS — M79675 Pain in left toe(s): Secondary | ICD-10-CM

## 2016-04-27 DIAGNOSIS — Z79899 Other long term (current) drug therapy: Secondary | ICD-10-CM | POA: Insufficient documentation

## 2016-04-27 DIAGNOSIS — J45909 Unspecified asthma, uncomplicated: Secondary | ICD-10-CM | POA: Insufficient documentation

## 2016-04-27 MED ORDER — PREDNISONE 20 MG PO TABS: 40.0000 mg | ORAL_TABLET | Freq: Every day | ORAL | 0 refills | Status: DC

## 2016-04-27 NOTE — ED Triage Notes (Signed)
C/o woke with pain to left great toe-denies known injury-presents to triage in w/c-ambulated into ED WR

## 2016-04-27 NOTE — ED Provider Notes (Signed)
[facility] Provider Note   CSN: [id number] Arrival date & time: 04/27/16  1325     History   Chief Complaint Chief Complaint  Patient presents with  . Toe Pain    HPI Dawn Thomas is a 40 y.o. female.  Patient is a 40 year old female with history of hypertension, asthma and renal disorder (2/2 HTN) who presents to the ED with complaint of left great toe pain, onset this morning. Patient reports when she woke up and stepped out of bed she had severe pain to her left great toe. She notes the pain is constant and worse with movement or bearing weight. Denies any known injury or recent fall. Denies fever, redness, swelling, numbness, tingling, weakness. Patient denies taking any medication at home for her symptoms. Denies history of gout.      Past Medical History:  Diagnosis Date  . Asthma   . Hypertension   . Migraines   . Renal disorder    stage 3  . Tendonitis   . Thyroid disease     Patient Active Problem List   Diagnosis Date Noted  . Right ankle pain 01/18/2012    Past Surgical History:  Procedure Laterality Date  . CESAREAN SECTION    . CHOLECYSTECTOMY    . DILATION AND CURETTAGE OF UTERUS      OB History    No data available       Home Medications    Prior to Admission medications   Medication Sig Start Date End Date Taking? Authorizing Provider  albuterol (PROVENTIL HFA;VENTOLIN HFA) 108 (90 BASE) MCG/ACT inhaler Inhale 2 puffs into the lungs every 6 (six) hours as needed. For shortness of breath    Historical Provider, MD  cetirizine (ZYRTEC ALLERGY) 10 MG tablet Take 1 tablet (10 mg total) by mouth daily. 10/14/15   Waynetta Pean, PA-C  cloNIDine (CATAPRES) 0.1 MG tablet Take 0.2 mg by mouth 2 (two) times daily.    Historical Provider, MD  fluticasone (FLONASE) 50 MCG/ACT nasal spray Place 2 sprays into both nostrils daily. 10/14/15   Waynetta Pean, PA-C  LEVOTHYROXINE SODIUM PO Take 75 mcg by mouth.     Historical Provider, MD    losartan-hydrochlorothiazide (HYZAAR) 100-12.5 MG per tablet Take 1 tablet by mouth daily.      Historical Provider, MD  METOPROLOL TARTRATE PO Take by mouth.    Historical Provider, MD  naproxen (NAPROSYN) 250 MG tablet Take 1 tablet (250 mg total) by mouth 2 (two) times daily with a meal. 10/14/15   Waynetta Pean, PA-C  oxyCODONE-acetaminophen (PERCOCET/ROXICET) 5-325 MG per tablet Take 1 tablet by mouth every 6 (six) hours as needed for severe pain. 04/24/15   Merryl Hacker, MD  predniSONE (DELTASONE) 20 MG tablet Take 2 tablets (40 mg total) by mouth daily. 04/27/16   Nona Dell, PA-C  traMADol (ULTRAM) 50 MG tablet Take 1 tablet (50 mg total) by mouth every 6 (six) hours as needed. 04/09/15   Veryl Speak, MD    Family History Family History  Problem Relation Age of Onset  . Diabetes Mother   . Hypertension Mother   . Hypertension Father   . Diabetes Father   . Sudden death Father   . Heart attack Neg Hx   . Hyperlipidemia Neg Hx     Social History Social History  Substance Use Topics  . Smoking status: Never Smoker  . Smokeless tobacco: Never Used  . Alcohol use No     Allergies  Maxalt [rizatriptan benzoate]; Adhesive [tape]; Hydrocodone; Penicillins; and Zithromax [azithromycin]   Review of Systems Review of Systems  Constitutional: Negative for fever.  Musculoskeletal: Positive for arthralgias (left great toe). Negative for joint swelling.  Skin: Negative for wound.  Neurological: Negative for weakness and numbness.     Physical Exam Updated Vital Signs BP (!) 167/107 (BP Location: Right Arm)   Pulse 62   Temp 98.6 F (37 C) (Oral)   Resp 18   Ht 5\' 6"  (1.676 m)   Wt (!) 181.4 kg   LMP 04/19/2016   SpO2 98%   BMI 64.56 kg/m   Physical Exam  Constitutional: She is oriented to person, place, and time. She appears well-developed and well-nourished.  HENT:  Head: Normocephalic and atraumatic.  Eyes: Conjunctivae and EOM are normal.  Right eye exhibits no discharge. Left eye exhibits no discharge. No scleral icterus.  Neck: Normal range of motion. Neck supple.  Cardiovascular: Normal rate and intact distal pulses.   Pulmonary/Chest: Effort normal.  Musculoskeletal: Normal range of motion. She exhibits tenderness. She exhibits no edema or deformity.       Left ankle: She exhibits normal range of motion, no swelling, no ecchymosis, no deformity, no laceration and normal pulse. No tenderness. Achilles tendon normal.       Left foot: There is tenderness. There is normal range of motion, no swelling, normal capillary refill, no crepitus, no deformity and no laceration.       Feet:  TTP over left 1st MTP joint and 1st proximal phalanx. No swelling, erythema, warmth or ecchymoses noted. FROM of left toes, foot, ankle and knee. Sensation intact. 2+ DP pulse. Cap refill <2.   Neurological: She is alert and oriented to person, place, and time.  Skin: Skin is warm and dry. Capillary refill takes less than 2 seconds.  Nursing note and vitals reviewed.    ED Treatments / Results  Labs (all labs ordered are listed, but only abnormal results are displayed) Labs Reviewed - No data to display  EKG  EKG Interpretation None       Radiology Dg Toe Great Left  Result Date: 04/27/2016 CLINICAL DATA:  Acute onset left great toe pain this morning on waking up. No known injury. Initial encounter. EXAM: LEFT GREAT TOE COMPARISON:  Plain films left foot 06/25/2014. FINDINGS: There is no evidence of fracture or dislocation. There is no evidence of arthropathy or other focal bone abnormality. Tiny exostosis off the dorsal aspect of the proximal metaphysis of the proximal phalanx is noted. Soft tissues are unremarkable. IMPRESSION: Normal exam. Electronically Signed   By: Inge Rise M.D.   On: 04/27/2016 13:59    Procedures Procedures (including critical care time)  Medications Ordered in ED Medications - No data to  display   Initial Impression / Assessment and Plan / ED Course  I have reviewed the triage vital signs and the nursing notes.  Pertinent labs & imaging results that were available during my care of the patient were reviewed by me and considered in my medical decision making (see chart for details).  Clinical Course    Pt presents with left great toe pain That started this morning, pain worse with movement or bearing weight. Denies any known injury. Denies fever or swelling. VSS. Exam revealed tenderness over left first MTP joint, no associated erythema, warmth or swelling. Left LE otherwise neurovascularly intact. Patient able to stand and ambulate but reports pain in her left toe. Left great toe exam unremarkable.  Suspect pt's pain is either due to new gout flare vs musculoskeletal etiology. Do not suspect septic joint. Due to pt with reported renal disorder, will send home with short course of steroids and symptomatic tx. Advised to follow up with PCP. Discussed return precautions.   Final Clinical Impressions(s) / ED Diagnoses   Final diagnoses:  Great toe pain, left    New Prescriptions New Prescriptions   PREDNISONE (DELTASONE) 20 MG TABLET    Take 2 tablets (40 mg total) by mouth daily.     Chesley Noon [place], [place] 04/27/16 [place], MD 04/27/16 1719

## 2016-04-27 NOTE — Discharge Instructions (Signed)
Take your medication as prescribed for suspected gout flare. I also recommend taking Tylenol as prescribed over-the-counter for pain relief. I recommend resting, elevating and applying ice to your toe for 15 minutes 3-4 times daily.  Follow-up with your family doctor in the next week if her symptoms have not improved. Please return to the Emergency Department if symptoms worsen or new onset of fever, redness, swelling, warmth, numbness, tingling, weakness.

## 2016-06-30 ENCOUNTER — Emergency Department (HOSPITAL_BASED_OUTPATIENT_CLINIC_OR_DEPARTMENT_OTHER): Payer: Managed Care, Other (non HMO)

## 2016-06-30 ENCOUNTER — Encounter (HOSPITAL_BASED_OUTPATIENT_CLINIC_OR_DEPARTMENT_OTHER): Payer: Self-pay | Admitting: *Deleted

## 2016-06-30 ENCOUNTER — Emergency Department (HOSPITAL_BASED_OUTPATIENT_CLINIC_OR_DEPARTMENT_OTHER)
Admission: EM | Admit: 2016-06-30 | Discharge: 2016-06-30 | Disposition: A | Discharge status: Home / Self Care | Payer: Managed Care, Other (non HMO) | Attending: Physician Assistant | Admitting: Physician Assistant

## 2016-06-30 DIAGNOSIS — I10 Essential (primary) hypertension: Secondary | ICD-10-CM | POA: Insufficient documentation

## 2016-06-30 DIAGNOSIS — N760 Acute vaginitis: Secondary | ICD-10-CM | POA: Diagnosis not present

## 2016-06-30 DIAGNOSIS — J45909 Unspecified asthma, uncomplicated: Secondary | ICD-10-CM | POA: Insufficient documentation

## 2016-06-30 DIAGNOSIS — R109 Unspecified abdominal pain: Secondary | ICD-10-CM

## 2016-06-30 DIAGNOSIS — Z79899 Other long term (current) drug therapy: Secondary | ICD-10-CM | POA: Insufficient documentation

## 2016-06-30 DIAGNOSIS — B9689 Other specified bacterial agents as the cause of diseases classified elsewhere: Secondary | ICD-10-CM

## 2016-06-30 DIAGNOSIS — R1084 Generalized abdominal pain: Secondary | ICD-10-CM | POA: Diagnosis present

## 2016-06-30 HISTORY — DX: Disorder of kidney and ureter, unspecified: N28.9

## 2016-06-30 LAB — COMPREHENSIVE METABOLIC PANEL
ALBUMIN: 3.8 g/dL (ref 3.5–5.0)
ALK PHOS: 49 U/L (ref 38–126)
ALT: 17 U/L (ref 14–54)
ANION GAP: 6 (ref 5–15)
AST: 16 U/L (ref 15–41)
BUN: 22 mg/dL — ABNORMAL HIGH (ref 6–20)
CHLORIDE: 107 mmol/L (ref 101–111)
CO2: 28 mmol/L (ref 22–32)
CREATININE: 1.81 mg/dL — AB (ref 0.44–1.00)
Calcium: 8.7 mg/dL — ABNORMAL LOW (ref 8.9–10.3)
GFR calc non Af Amer: 34 mL/min — ABNORMAL LOW (ref >60)
GFR, EST AFRICAN AMERICAN: 39 mL/min — AB (ref >60)
GLUCOSE: 103 mg/dL — AB (ref 65–99)
Potassium: 3.6 mmol/L (ref 3.5–5.1)
SODIUM: 141 mmol/L (ref 135–145)
Total Bilirubin: 0.8 mg/dL (ref 0.3–1.2)
Total Protein: 7.3 g/dL (ref 6.5–8.1)

## 2016-06-30 LAB — URINALYSIS, ROUTINE W REFLEX MICROSCOPIC
BILIRUBIN URINE: NEGATIVE
Glucose, UA: NEGATIVE mg/dL
KETONES UR: NEGATIVE mg/dL
NITRITE: NEGATIVE
PH: 5.5 (ref 5.0–8.0)
PROTEIN: 100 mg/dL — AB
Specific Gravity, Urine: 1.019 (ref 1.005–1.030)

## 2016-06-30 LAB — CBC WITH DIFFERENTIAL/PLATELET
BASOS PCT: 0 %
Basophils Absolute: 0 10*3/uL (ref 0.0–0.1)
EOS ABS: 0.1 10*3/uL (ref 0.0–0.7)
EOS PCT: 1 %
HCT: 37.6 % (ref 36.0–46.0)
HEMOGLOBIN: 12 g/dL (ref 12.0–15.0)
LYMPHS ABS: 2.4 10*3/uL (ref 0.7–4.0)
Lymphocytes Relative: 25 %
MCH: 29.4 pg (ref 26.0–34.0)
MCHC: 31.9 g/dL (ref 30.0–36.0)
MCV: 92.2 fL (ref 78.0–100.0)
Monocytes Absolute: 0.4 10*3/uL (ref 0.1–1.0)
Monocytes Relative: 4 %
NEUTROS PCT: 70 %
Neutro Abs: 6.8 10*3/uL (ref 1.7–7.7)
PLATELETS: 251 10*3/uL (ref 150–400)
RBC: 4.08 MIL/uL (ref 3.87–5.11)
RDW: 14.5 % (ref 11.5–15.5)
WBC: 9.7 10*3/uL (ref 4.0–10.5)

## 2016-06-30 LAB — URINE MICROSCOPIC-ADD ON

## 2016-06-30 LAB — WET PREP, GENITAL
SPERM: NONE SEEN
TRICH WET PREP: NONE SEEN
YEAST WET PREP: NONE SEEN

## 2016-06-30 LAB — LIPASE, BLOOD: Lipase: 18 U/L (ref 11–51)

## 2016-06-30 LAB — PREGNANCY, URINE: PREG TEST UR: NEGATIVE

## 2016-06-30 MED ORDER — SUCRALFATE 1 GM/10ML PO SUSP: 1.0000 g | Freq: Three times a day (TID) | ORAL | 0 refills | Status: DC

## 2016-06-30 MED ORDER — IOPAMIDOL (ISOVUE-300) INJECTION 61%
100.0000 mL | Freq: Once | INTRAVENOUS | Status: AC | PRN
  Administered 2016-06-30: 80 mL via INTRAVENOUS

## 2016-06-30 MED ORDER — OMEPRAZOLE 20 MG PO CPDR: 20.0000 mg | DELAYED_RELEASE_CAPSULE | Freq: Every day | ORAL | 0 refills | Status: DC

## 2016-06-30 MED ORDER — METRONIDAZOLE 500 MG PO TABS: 500.0000 mg | ORAL_TABLET | Freq: Two times a day (BID) | ORAL | 0 refills | Status: DC

## 2016-06-30 MED ORDER — GI COCKTAIL ~~LOC~~
30.0000 mL | Freq: Once | ORAL | Status: AC
  Administered 2016-06-30: 30 mL via ORAL
  Filled 2016-06-30: qty 30

## 2016-06-30 NOTE — ED Provider Notes (Signed)
[facility] DEPT [facility] Provider Note   CSN: [id number] Arrival date & time: 06/30/16  1347     History   Chief Complaint Chief Complaint  Patient presents with  . Abdominal Pain    HPI Dawn Thomas is a 40 y.o. female.  Patient presents to the emergency department with chief complaint of diffuse abdominal pain. She states that her symptoms started last night at midnight. She states that she is unable to tell specifically wearing her abdomen she is having the pain, and states he feels like it is all over. She denies any fever, but states that she did have some chills last night. She denies any associated nausea, vomiting, or diarrhea. Denies any dysuria or hematuria. She denies any vaginal discharge, but states that she is finishing her period. She has not taken anything for her symptoms. She denies any recent alcohol use. She has a prior surgical history including cholecystectomy.    The history is provided by the patient. No language interpreter was used.    Past Medical History:  Diagnosis Date  . Asthma   . Hypertension   . Migraines   . Renal disorder    stage 3  . Tendonitis   . Thyroid disease     Patient Active Problem List   Diagnosis Date Noted  . Right ankle pain 01/18/2012    Past Surgical History:  Procedure Laterality Date  . CESAREAN SECTION    . CHOLECYSTECTOMY    . DILATION AND CURETTAGE OF UTERUS      OB History    No data available       Home Medications    Prior to Admission medications   Medication Sig Start Date End Date Taking? Authorizing Provider  acetaminophen (TYLENOL) 325 MG tablet Take 650 mg by mouth every 6 (six) hours as needed.   Yes Historical Provider, MD  topiramate (TOPAMAX) 100 MG tablet Take 100 mg by mouth 2 (two) times daily.   Yes Historical Provider, MD  albuterol (PROVENTIL HFA;VENTOLIN HFA) 108 (90 BASE) MCG/ACT inhaler Inhale 2 puffs into the lungs every 6 (six) hours as needed. For shortness of breath     Historical Provider, MD  cetirizine (ZYRTEC ALLERGY) 10 MG tablet Take 1 tablet (10 mg total) by mouth daily. 10/14/15   Waynetta Pean, PA-C  cloNIDine (CATAPRES) 0.1 MG tablet Take 0.2 mg by mouth 2 (two) times daily.    Historical Provider, MD  fluticasone (FLONASE) 50 MCG/ACT nasal spray Place 2 sprays into both nostrils daily. 10/14/15   Waynetta Pean, PA-C  LEVOTHYROXINE SODIUM PO Take 75 mcg by mouth.     Historical Provider, MD  losartan-hydrochlorothiazide (HYZAAR) 100-12.5 MG per tablet Take 1 tablet by mouth daily.      Historical Provider, MD  METOPROLOL TARTRATE PO Take by mouth.    Historical Provider, MD  naproxen (NAPROSYN) 250 MG tablet Take 1 tablet (250 mg total) by mouth 2 (two) times daily with a meal. 10/14/15   Waynetta Pean, PA-C  traMADol (ULTRAM) 50 MG tablet Take 1 tablet (50 mg total) by mouth every 6 (six) hours as needed. 04/09/15   Veryl Speak, MD    Family History Family History  Problem Relation Age of Onset  . Diabetes Mother   . Hypertension Mother   . Hypertension Father   . Diabetes Father   . Sudden death Father   . Heart attack Neg Hx   . Hyperlipidemia Neg Hx     Social History Social History  Substance Use Topics  . Smoking status: Never Smoker  . Smokeless tobacco: Never Used  . Alcohol use No     Allergies   Maxalt [rizatriptan benzoate]; Adhesive [tape]; Hydrocodone; Penicillins; and Zithromax [azithromycin]   Review of Systems Review of Systems  Gastrointestinal: Positive for abdominal pain.  All other systems reviewed and are negative.    Physical Exam Updated Vital Signs BP (!) 187/115   Pulse 75   Temp 98.5 F (36.9 C)   Resp 18   Ht 5\' 6"  (1.676 m)   Wt (!) 172.4 kg   LMP 06/30/2016   SpO2 98%   BMI 61.33 kg/m   Physical Exam  Constitutional: She is oriented to person, place, and time. She appears well-developed and well-nourished.  HENT:  Head: Normocephalic and atraumatic.  Eyes: Conjunctivae and EOM are  normal. Pupils are equal, round, and reactive to light.  Neck: Normal range of motion. Neck supple.  Cardiovascular: Normal rate and regular rhythm.  Exam reveals no gallop and no friction rub.   No murmur heard. Pulmonary/Chest: Effort normal and breath sounds normal. No respiratory distress. She has no wheezes. She has no rales. She exhibits no tenderness.  Abdominal: Soft. Bowel sounds are normal. She exhibits no distension and no mass. There is tenderness. There is no rebound and no guarding.  Epigastric abdominal tenderness, and no lower abdominal tenderness on exam, however exam is limited by the patient's body habitus  Genitourinary:  Genitourinary Comments: Pelvic exam chaperoned by female ER tech, no right or left adnexal tenderness, no uterine tenderness, no vaginal discharge, scant old blood in vault, no CMT or friability, no foreign body, no injury to the external genitalia, no other significant findings   Musculoskeletal: Normal range of motion. She exhibits no edema or tenderness.  Neurological: She is alert and oriented to person, place, and time.  Skin: Skin is warm and dry.  Psychiatric: She has a normal mood and affect. Her behavior is normal. Judgment and thought content normal.  Nursing note and vitals reviewed.    ED Treatments / Results  Labs (all labs ordered are listed, but only abnormal results are displayed) Labs Reviewed  WET PREP, GENITAL  URINALYSIS, ROUTINE W REFLEX MICROSCOPIC (NOT AT [facility])  PREGNANCY, URINE  CBC WITH DIFFERENTIAL/PLATELET  COMPREHENSIVE METABOLIC PANEL  LIPASE, BLOOD  GC/CHLAMYDIA PROBE AMP ([place]) NOT AT [facility]    EKG  EKG Interpretation None       Radiology No results found.  Procedures Procedures (including critical care time)  Medications Ordered in ED Medications  gi cocktail (Maalox,Lidocaine,Donnatal) (not administered)     Initial Impression / Assessment and Plan / ED Course  I have reviewed the triage  vital signs and the nursing notes.  Pertinent labs & imaging results that were available during my care of the patient were reviewed by me and considered in my medical decision making (see chart for details).  Clinical Course     Patient with abdominal pain, which she describes as diffuse.  Sudden onset last night.  Hx of cholecystectomy.  Still has appendix.  No Etoh.  Will check labs and reassess.  Pelvic exam is unremarkable, there is no adnexal tenderness, or discharge.  Urine pregnancy is negative, urinalysis negative for signs of infection.  Wet prep shows clue cells.  I doubt that this is the source of the patient's symptoms, but will give flagyl for home.  CT scan is unremarkable. Patient is feeling better. Will try omeprazole and Carafate.  Return precautions given. Watchful waiting at this time. Patient is stable and ready for discharge.   Final Clinical Impressions(s) / ED Diagnoses   Final diagnoses:  Abdominal pain, unspecified abdominal location  BV (bacterial vaginosis)    New Prescriptions New Prescriptions   METRONIDAZOLE (FLAGYL) 500 MG TABLET    Take 1 tablet (500 mg total) by mouth 2 (two) times daily.   OMEPRAZOLE (PRILOSEC) 20 MG CAPSULE    Take 1 capsule (20 mg total) by mouth daily.   SUCRALFATE (CARAFATE) 1 GM/10ML SUSPENSION    Take 10 mLs (1 g total) by mouth 4 (four) times daily -  with meals and at bedtime.     Montine Circle, PA-C 06/30/16 1613    Courteney Julio Alm, MD 07/01/16 1323

## 2016-06-30 NOTE — ED Triage Notes (Addendum)
Pt co lower abd pain, fever and chills  x 12 hrs

## 2016-07-01 LAB — GC/CHLAMYDIA PROBE AMP (~~LOC~~) NOT AT ARMC
Chlamydia: NEGATIVE
NEISSERIA GONORRHEA: NEGATIVE

## 2016-07-01 MED FILL — OMEPRAZOLE DR 20 MG CAPSULE: 20 | 30 days supply | Qty: 30 | Fill #0

## 2016-07-01 MED FILL — metroNIDAZOLE 500 MG TABS: 500 | 7 days supply | Qty: 14 | Fill #0

## 2016-07-01 MED FILL — CARAFATE 1 GM/10 ML SUSP: 1 | 11 days supply | Qty: 420 | Fill #0

## 2016-08-07 ENCOUNTER — Emergency Department (HOSPITAL_BASED_OUTPATIENT_CLINIC_OR_DEPARTMENT_OTHER)
Admission: EM | Admit: 2016-08-07 | Discharge: 2016-08-08 | Disposition: A | Discharge status: Home / Self Care | Payer: Managed Care, Other (non HMO) | Attending: Emergency Medicine | Admitting: Emergency Medicine

## 2016-08-07 ENCOUNTER — Encounter (HOSPITAL_BASED_OUTPATIENT_CLINIC_OR_DEPARTMENT_OTHER): Payer: Self-pay | Admitting: *Deleted

## 2016-08-07 ENCOUNTER — Emergency Department (HOSPITAL_BASED_OUTPATIENT_CLINIC_OR_DEPARTMENT_OTHER): Payer: Managed Care, Other (non HMO)

## 2016-08-07 DIAGNOSIS — M7989 Other specified soft tissue disorders: Secondary | ICD-10-CM | POA: Diagnosis present

## 2016-08-07 DIAGNOSIS — I10 Essential (primary) hypertension: Secondary | ICD-10-CM | POA: Insufficient documentation

## 2016-08-07 DIAGNOSIS — J45909 Unspecified asthma, uncomplicated: Secondary | ICD-10-CM | POA: Insufficient documentation

## 2016-08-07 DIAGNOSIS — R600 Localized edema: Secondary | ICD-10-CM | POA: Insufficient documentation

## 2016-08-07 DIAGNOSIS — Z79899 Other long term (current) drug therapy: Secondary | ICD-10-CM | POA: Diagnosis not present

## 2016-08-07 DIAGNOSIS — M10072 Idiopathic gout, left ankle and foot: Secondary | ICD-10-CM | POA: Insufficient documentation

## 2016-08-07 DIAGNOSIS — M109 Gout, unspecified: Secondary | ICD-10-CM

## 2016-08-07 LAB — BASIC METABOLIC PANEL
Anion gap: 7 (ref 5–15)
BUN: 22 mg/dL — ABNORMAL HIGH (ref 6–20)
CO2: 28 mmol/L (ref 22–32)
Calcium: 9.1 mg/dL (ref 8.9–10.3)
Chloride: 105 mmol/L (ref 101–111)
Creatinine, Ser: 1.89 mg/dL — ABNORMAL HIGH (ref 0.44–1.00)
GFR calc Af Amer: 37 mL/min — ABNORMAL LOW (ref >60)
GFR calc non Af Amer: 32 mL/min — ABNORMAL LOW (ref >60)
Glucose, Bld: 109 mg/dL — ABNORMAL HIGH (ref 65–99)
Potassium: 3.6 mmol/L (ref 3.5–5.1)
Sodium: 140 mmol/L (ref 135–145)

## 2016-08-07 LAB — CBC WITH DIFFERENTIAL/PLATELET
Basophils Absolute: 0 10*3/uL (ref 0.0–0.1)
Basophils Relative: 0 %
Eosinophils Absolute: 0.2 10*3/uL (ref 0.0–0.7)
Eosinophils Relative: 2 %
HCT: 37.8 % (ref 36.0–46.0)
Hemoglobin: 11.8 g/dL — ABNORMAL LOW (ref 12.0–15.0)
Lymphocytes Relative: 31 %
Lymphs Abs: 2.7 10*3/uL (ref 0.7–4.0)
MCH: 29.1 pg (ref 26.0–34.0)
MCHC: 31.2 g/dL (ref 30.0–36.0)
MCV: 93.1 fL (ref 78.0–100.0)
Monocytes Absolute: 0.5 10*3/uL (ref 0.1–1.0)
Monocytes Relative: 5 %
Neutro Abs: 5.4 10*3/uL (ref 1.7–7.7)
Neutrophils Relative %: 62 %
Platelets: 253 10*3/uL (ref 150–400)
RBC: 4.06 MIL/uL (ref 3.87–5.11)
RDW: 14.5 % (ref 11.5–15.5)
WBC: 8.8 10*3/uL (ref 4.0–10.5)

## 2016-08-07 MED ORDER — OXYCODONE-ACETAMINOPHEN 5-325 MG PO TABS: 1.0000 | ORAL_TABLET | Freq: Four times a day (QID) | ORAL | 0 refills | Status: DC | PRN

## 2016-08-07 MED ORDER — PREDNISONE 10 MG (21) PO TBPK: 10.0000 mg | ORAL_TABLET | Freq: Every day | ORAL | 0 refills | Status: DC

## 2016-08-07 NOTE — Discharge Instructions (Signed)
Medications: Prednisone, Percocet  Treatment: Take prednisone as prescribed. Take 1 Percocet every 6 hours as needed for severe pain. Take tramadol as prescribed for mild to moderate pain.  Follow-up: Please follow-up with your primary care provider this week for follow-up and recheck of your symptoms. Please return to emergency department if you develop any new or worsening symptoms.

## 2016-08-07 NOTE — ED Notes (Signed)
Back from xray via stretcher, alert, NAD, calm, smiling, no dyspnea noted.

## 2016-08-07 NOTE — ED Provider Notes (Signed)
[facility] Provider Note   CSN: [id number] Arrival date & time: 08/07/16  1710  By signing my name below, I, Julien Nordmann, attest that this documentation has been prepared under the direction and in the presence of Talonda Artist M Alainna Stawicki, PA-C.  Electronically Signed: Julien Nordmann, ED Scribe. 08/07/16. 6:58 PM.    History   Chief Complaint Chief Complaint  Patient presents with  . Foot Swelling   The history is provided by the patient. No language interpreter was used.   HPI Comments: Dawn Thomas is a 41 y.o. female who has a PMhx of HTN, gout, tendonitis in left ankle and renal disorder (stage III) presents to the Emergency Department complaining of acute on chronic, moderate bilateral foot pain x 3 days. She has been having associated bilateral foot swelling (L>R) and notes inability to move her left great big toe. Pt also expresses having a subjective fever, chills, and shortness of breath two days ago which she states has resolved completely. Her current presentation of symptoms is the same as it was with her first gout flare up. She is currently on allopurinol for her hx of gout. Pt says that when she was first diagnosed with Gout, she no longer drank soda because she believed that caused her flare-ups. She states 3 days ago she drank a [facility] and a few hours later she began to have foot pain. She expresses increased pain when ambulating and light touch. She also notes intermittent shooting pain in her left foot and aching pain in her right foot with no movement. Pt is able to ambulate but notes she ambulates with a limp and she says that she is unable to bear full weight on her left foot. She has applied ice to the area with no relief. She denies hx of DM and hx of CHF. Pt further denies chest pain, abdominal pain, nausea or vomiting, or known injury.  Past Medical History:  Diagnosis Date  . Asthma   . Hypertension   . Migraines   . Renal disorder    stage 3  .  Renal insufficiency   . Tendonitis   . Thyroid disease     Patient Active Problem List   Diagnosis Date Noted  . Right ankle pain 01/18/2012    Past Surgical History:  Procedure Laterality Date  . CESAREAN SECTION    . CHOLECYSTECTOMY    . DILATION AND CURETTAGE OF UTERUS      OB History    No data available       Home Medications    Prior to Admission medications   Medication Sig Start Date End Date Taking? Authorizing Provider  ALLOPURINOL PO Take by mouth.   Yes Historical Provider, MD  acetaminophen (TYLENOL) 325 MG tablet Take 650 mg by mouth every 6 (six) hours as needed.    Historical Provider, MD  albuterol (PROVENTIL HFA;VENTOLIN HFA) 108 (90 BASE) MCG/ACT inhaler Inhale 2 puffs into the lungs every 6 (six) hours as needed. For shortness of breath    Historical Provider, MD  cetirizine (ZYRTEC ALLERGY) 10 MG tablet Take 1 tablet (10 mg total) by mouth daily. 10/14/15   Waynetta Pean, PA-C  cloNIDine (CATAPRES) 0.1 MG tablet Take 0.2 mg by mouth 2 (two) times daily.    Historical Provider, MD  LEVOTHYROXINE SODIUM PO Take 75 mcg by mouth.     Historical Provider, MD  losartan-hydrochlorothiazide (HYZAAR) 100-12.5 MG per tablet Take 1 tablet by mouth daily.  Historical Provider, MD  METOPROLOL TARTRATE PO Take by mouth.    Historical Provider, MD  naproxen (NAPROSYN) 250 MG tablet Take 1 tablet (250 mg total) by mouth 2 (two) times daily with a meal. 10/14/15   Waynetta Pean, PA-C  omeprazole (PRILOSEC) 20 MG capsule Take 1 capsule (20 mg total) by mouth daily. 06/30/16   Montine Circle, PA-C  oxyCODONE-acetaminophen (PERCOCET/ROXICET) 5-325 MG tablet Take 1 tablet by mouth every 6 (six) hours as needed for severe pain. 08/07/16   Frederica Kuster, PA-C  predniSONE (STERAPRED UNI-PAK 21 TAB) 10 MG (21) TBPK tablet Take 1 tablet (10 mg total) by mouth daily. Take 6 tabs by mouth daily  for 2 days, then 5 tabs for 2 days, then 4 tabs for 2 days, then 3 tabs for 2 days, 2  tabs for 2 days, then 1 tab by mouth daily for 2 days 08/07/16   Bea Graff Franky Reier, PA-C  sucralfate (CARAFATE) 1 GM/10ML suspension Take 10 mLs (1 g total) by mouth 4 (four) times daily -  with meals and at bedtime. 06/30/16   Montine Circle, PA-C  topiramate (TOPAMAX) 100 MG tablet Take 100 mg by mouth 2 (two) times daily.    Historical Provider, MD  traMADol (ULTRAM) 50 MG tablet Take 1 tablet (50 mg total) by mouth every 6 (six) hours as needed. 04/09/15   Veryl Speak, MD    Family History Family History  Problem Relation Age of Onset  . Diabetes Mother   . Hypertension Mother   . Hypertension Father   . Diabetes Father   . Sudden death Father   . Heart attack Neg Hx   . Hyperlipidemia Neg Hx     Social History Social History  Substance Use Topics  . Smoking status: Never Smoker  . Smokeless tobacco: Never Used  . Alcohol use No     Allergies   Maxalt [rizatriptan benzoate]; Adhesive [tape]; Hydrocodone; Penicillins; and Zithromax [azithromycin]   Review of Systems Review of Systems  Constitutional: Negative for chills and fever.  HENT: Negative for facial swelling and sore throat.   Respiratory: Negative for shortness of breath.   Cardiovascular: Negative for chest pain.  Gastrointestinal: Negative for abdominal pain, nausea and vomiting.  Genitourinary: Negative for dysuria.  Musculoskeletal: Positive for joint swelling and myalgias (bilateral foot pain). Negative for back pain.  Skin: Negative for rash and wound.  Neurological: Negative for headaches.  Psychiatric/Behavioral: The patient is not nervous/anxious.      Physical Exam Updated Vital Signs BP 137/85   Pulse (!) 56   Temp 97.9 F (36.6 C) (Oral)   Resp 20   Ht 5\' 6"  (1.676 m)   Wt (!) 172.4 kg   LMP 07/30/2016 (Exact Date)   SpO2 99%   BMI 61.33 kg/m   Physical Exam  Constitutional: She appears well-developed and well-nourished. No distress.  obese  HENT:  Head: Normocephalic and atraumatic.   Mouth/Throat: Oropharynx is clear and moist. No oropharyngeal exudate.  Eyes: Conjunctivae are normal. Pupils are equal, round, and reactive to light. Right eye exhibits no discharge. Left eye exhibits no discharge. No scleral icterus.  Neck: Normal range of motion. Neck supple. No thyromegaly present.  Cardiovascular: Normal rate, regular rhythm, normal heart sounds and intact distal pulses.  Exam reveals no gallop and no friction rub.   No murmur heard. Pulmonary/Chest: Effort normal and breath sounds normal. No stridor. No respiratory distress. She has no wheezes. She has no rales.  Abdominal: Soft.  Bowel sounds are normal. She exhibits no distension. There is no tenderness. There is no rebound and no guarding.  Musculoskeletal: She exhibits edema and tenderness.  Bilateral non pitting edema to the feet. Edema and erythema to left great MTP with TTP. TTP to right great MTP, DP pulses intact. Mild TTP of left ankle.  Lymphadenopathy:    She has no cervical adenopathy.  Neurological: She is alert. Coordination normal.  Skin: Skin is warm and dry. No rash noted. She is not diaphoretic. No pallor.  Psychiatric: She has a normal mood and affect.  Nursing note and vitals reviewed.    ED Treatments / Results  DIAGNOSTIC STUDIES: Oxygen Saturation is 100% on RA, normal by my interpretation.  COORDINATION OF CARE:  6:56 PM Discussed treatment plan with pt at bedside and pt agreed to plan.  Labs (all labs ordered are listed, but only abnormal results are displayed) Labs Reviewed  BASIC METABOLIC PANEL - Abnormal; Notable for the following:       Result Value   Glucose, Bld 109 (*)    BUN 22 (*)    Creatinine, Ser 1.89 (*)    GFR calc non Af Amer 32 (*)    GFR calc Af Amer 37 (*)    All other components within normal limits  CBC WITH DIFFERENTIAL/PLATELET - Abnormal; Notable for the following:    Hemoglobin 11.8 (*)    All other components within normal limits    EKG  EKG  Interpretation  Date/Time:  Sunday August 07 2016 19:29:04 EST Ventricular Rate:  57 PR Interval:    QRS Duration: 107 QT Interval:  456 QTC Calculation: 444 R Axis:   -13 Text Interpretation:  Sinus rhythm Abnormal R-wave progression, late transition Probable left ventricular hypertrophy No significant change since last tracing Confirmed by Maryan Rued  MD, Loree Fee ([id number]) on 08/07/2016 7:45:17 PM       Radiology Dg Chest 2 View  Result Date: 08/07/2016 CLINICAL DATA:  bilat foot swelling x 2-3 days, right >left. denies SOB. No cough or congestion. Non smoker. htn. EXAM: CHEST  2 VIEW COMPARISON:  04/24/2015. FINDINGS: Cardiac silhouette is borderline enlarged. No mediastinal or hilar masses. No convincing adenopathy. Clear lungs.  No pleural effusion or pneumothorax. Skeletal structures are intact. IMPRESSION: No acute cardiopulmonary disease. Electronically Signed   By: Lajean Manes M.D.   On: 08/07/2016 19:36    Procedures Procedures (including critical care time)  Medications Ordered in ED Medications - No data to display   Initial Impression / Assessment and Plan / ED Course  I have reviewed the triage vital signs and the nursing notes.  Pertinent labs & imaging results that were available during my care of the patient were reviewed by me and considered in my medical decision making (see chart for details).  Clinical Course     Patient with most likely gout. CBC shows hemoglobin 11.8. BMP shows stable BUN/creatinine, 22, 1.89, respectively. CXR shows no active cardiopulmonary disease. EKG shows no significant change since last tracing. Will discharge patient home with prednisone and short course of Percocet. [place] narcotic database reviewed and no discrepancies found. Follow-up to PCP this week. Return precautions discussed. Patient understands and agrees with plan. Patient vitals stable throughout ED course and discharged in satisfactory condition. I discussed patient case with Dr.  Maryan Rued who guided the patient's management and agrees with plan.  Final Clinical Impressions(s) / ED Diagnoses   Final diagnoses:  Acute gout involving toe, unspecified cause, unspecified laterality  Edema of foot   I personally performed the services described in this documentation, which was scribed in my presence. The recorded information has been reviewed and is accurate.   New Prescriptions New Prescriptions   OXYCODONE-ACETAMINOPHEN (PERCOCET/ROXICET) 5-325 MG TABLET    Take 1 tablet by mouth every 6 (six) hours as needed for severe pain.   PREDNISONE (STERAPRED UNI-PAK 21 TAB) 10 MG (21) TBPK TABLET    Take 1 tablet (10 mg total) by mouth daily. Take 6 tabs by mouth daily  for 2 days, then 5 tabs for 2 days, then 4 tabs for 2 days, then 3 tabs for 2 days, 2 tabs for 2 days, then 1 tab by mouth daily for 2 days     Frederica Kuster, PA-C 08/07/16 2102    Blanchie Dessert, MD 08/08/16 [phone]

## 2016-08-07 NOTE — ED Triage Notes (Signed)
Pt reports hx of gout, states that she feels like she has gout in bilateral feet x 3 days ago.  Noted to have swelling in bilateral feet, worse in right foot.  Ambulatory with limp.  Denies SOB.  States that she had a fever and was SOB 2 days ago.

## 2016-08-07 NOTE — ED Notes (Signed)
Pt alert, NAD, calm, interactive, no dyspnea noted, resting comfortably, no changes, VSS, (denies: CP, sob, dizziness, visual changes), EDPA into room, at [facility].

## 2016-08-25 ENCOUNTER — Emergency Department (HOSPITAL_BASED_OUTPATIENT_CLINIC_OR_DEPARTMENT_OTHER)
Admission: EM | Admit: 2016-08-25 | Discharge: 2016-08-25 | Disposition: A | Discharge status: Home / Self Care | Payer: Managed Care, Other (non HMO) | Attending: Emergency Medicine | Admitting: Emergency Medicine

## 2016-08-25 ENCOUNTER — Encounter (HOSPITAL_BASED_OUTPATIENT_CLINIC_OR_DEPARTMENT_OTHER): Payer: Self-pay | Admitting: *Deleted

## 2016-08-25 DIAGNOSIS — R21 Rash and other nonspecific skin eruption: Secondary | ICD-10-CM

## 2016-08-25 DIAGNOSIS — L509 Urticaria, unspecified: Secondary | ICD-10-CM | POA: Insufficient documentation

## 2016-08-25 DIAGNOSIS — N183 Chronic kidney disease, stage 3 (moderate): Secondary | ICD-10-CM | POA: Diagnosis not present

## 2016-08-25 DIAGNOSIS — Z79899 Other long term (current) drug therapy: Secondary | ICD-10-CM | POA: Insufficient documentation

## 2016-08-25 DIAGNOSIS — J45909 Unspecified asthma, uncomplicated: Secondary | ICD-10-CM | POA: Insufficient documentation

## 2016-08-25 DIAGNOSIS — I129 Hypertensive chronic kidney disease with stage 1 through stage 4 chronic kidney disease, or unspecified chronic kidney disease: Secondary | ICD-10-CM | POA: Insufficient documentation

## 2016-08-25 MED ORDER — PREDNISONE 50 MG PO TABS
60.0000 mg | ORAL_TABLET | Freq: Every day | ORAL | Status: DC
  Administered 2016-08-25: 60 mg via ORAL
  Filled 2016-08-25: qty 1

## 2016-08-25 MED ORDER — PREDNISONE 10 MG PO TABS: ORAL_TABLET | ORAL | 0 refills | Status: DC

## 2016-08-25 MED ORDER — DIPHENHYDRAMINE HCL 25 MG PO CAPS
50.0000 mg | ORAL_CAPSULE | Freq: Once | ORAL | Status: AC
  Administered 2016-08-25: 50 mg via ORAL
  Filled 2016-08-25: qty 2

## 2016-08-25 NOTE — ED Notes (Signed)
3rd day of itchy rash, ? Cause , denies SOB or throat swelling

## 2016-08-25 NOTE — ED Notes (Signed)
Pt verbalizes understanding of d/c instructions and denies any further needs at this time. 

## 2016-08-25 NOTE — ED Provider Notes (Signed)
[facility] DEPT [facility] Provider Note   CSN: [id number] Arrival date & time: 08/25/16  1814   By signing my name below, I, Eunice Blase, attest that this documentation has been prepared under the direction and in the presence of Suezanne Jacquet, PA-C. Electronically Signed: Eunice Blase, Scribe. 08/25/16. 9:34 PM.   History   Chief Complaint Chief Complaint  Patient presents with  . Rash   The history is provided by the patient and medical records. No language interpreter was used.    HPI Comments: Dawn Thomas is a 41 y.o. female who presents to the Emergency Department complaining of a generalized rash x 2 days. She states she has hives that are spreading and itching to affected areas. Triage further notes pt has not taken benadryl at home, and she notes that she has not taken it because she has stage 3 renal disease. She notes she recently took prednisone 1.5 weeks ago for gout. She also notes Hx of HTN treated wtih metoprolol. Pt denies new medications, decr urine, swell palp and Hx of DM. Nephrologist in [facility]. PCP Dr. Ramiro Harvest.  Past Medical History:  Diagnosis Date  . Asthma   . Hypertension   . Migraines   . Renal disorder    stage 3  . Renal insufficiency   . Tendonitis   . Thyroid disease     Patient Active Problem List   Diagnosis Date Noted  . Right ankle pain 01/18/2012    Past Surgical History:  Procedure Laterality Date  . CESAREAN SECTION    . CHOLECYSTECTOMY    . DILATION AND CURETTAGE OF UTERUS      OB History    No data available       Home Medications    Prior to Admission medications   Medication Sig Start Date End Date Taking? Authorizing Provider  acetaminophen (TYLENOL) 325 MG tablet Take 650 mg by mouth every 6 (six) hours as needed.    Historical Provider, MD  albuterol (PROVENTIL HFA;VENTOLIN HFA) 108 (90 BASE) MCG/ACT inhaler Inhale 2 puffs into the lungs every 6 (six) hours as needed. For shortness of breath    Historical  Provider, MD  ALLOPURINOL PO Take by mouth.    Historical Provider, MD  cetirizine (ZYRTEC ALLERGY) 10 MG tablet Take 1 tablet (10 mg total) by mouth daily. 10/14/15   Waynetta Pean, PA-C  cloNIDine (CATAPRES) 0.1 MG tablet Take 0.2 mg by mouth 2 (two) times daily.    Historical Provider, MD  LEVOTHYROXINE SODIUM PO Take 75 mcg by mouth.     Historical Provider, MD  losartan-hydrochlorothiazide (HYZAAR) 100-12.5 MG per tablet Take 1 tablet by mouth daily.      Historical Provider, MD  METOPROLOL TARTRATE PO Take by mouth.    Historical Provider, MD  naproxen (NAPROSYN) 250 MG tablet Take 1 tablet (250 mg total) by mouth 2 (two) times daily with a meal. 10/14/15   Waynetta Pean, PA-C  omeprazole (PRILOSEC) 20 MG capsule Take 1 capsule (20 mg total) by mouth daily. 06/30/16   Montine Circle, PA-C  oxyCODONE-acetaminophen (PERCOCET/ROXICET) 5-325 MG tablet Take 1 tablet by mouth every 6 (six) hours as needed for severe pain. 08/07/16   Frederica Kuster, PA-C  predniSONE (DELTASONE) 10 MG tablet 5,4,3,2,1 08/25/16   Fransico Meadow, PA-C  sucralfate (CARAFATE) 1 GM/10ML suspension Take 10 mLs (1 g total) by mouth 4 (four) times daily -  with meals and at bedtime. 06/30/16   Montine Circle, PA-C  topiramate (TOPAMAX)  100 MG tablet Take 100 mg by mouth 2 (two) times daily.    Historical Provider, MD  traMADol (ULTRAM) 50 MG tablet Take 1 tablet (50 mg total) by mouth every 6 (six) hours as needed. 04/09/15   Veryl Speak, MD    Family History Family History  Problem Relation Age of Onset  . Diabetes Mother   . Hypertension Mother   . Hypertension Father   . Diabetes Father   . Sudden death Father   . Heart attack Neg Hx   . Hyperlipidemia Neg Hx     Social History Social History  Substance Use Topics  . Smoking status: Never Smoker  . Smokeless tobacco: Never Used  . Alcohol use No     Allergies   Maxalt [rizatriptan benzoate]; Adhesive [tape]; Hydrocodone; Penicillins; and Zithromax  [azithromycin]   Review of Systems Review of Systems  All other systems reviewed and are negative.  A complete 10 system review of systems was obtained and all systems are negative except as noted in the HPI and PMH.    Physical Exam Updated Vital Signs BP 156/77 (BP Location: Right Arm)   Pulse 61   Temp 98.2 F (36.8 C) (Oral)   Resp 20   Ht 5\' 6"  (1.676 m)   Wt (!) 380 lb (172.4 kg)   LMP 08/25/2016   SpO2 100%   BMI 61.33 kg/m   Physical Exam  Constitutional: She is oriented to person, place, and time. Vital signs are normal. She appears well-developed and well-nourished.  Non-toxic appearance. No distress.  Afebrile, nontoxic, NAD  HENT:  Head: Normocephalic and atraumatic.  Mouth/Throat: Mucous membranes are normal.  Eyes: Conjunctivae and EOM are normal. Right eye exhibits no discharge. Left eye exhibits no discharge.  Neck: Normal range of motion. Neck supple.  Cardiovascular: Normal rate and intact distal pulses.   Pulmonary/Chest: Effort normal. No respiratory distress.  Abdominal: Normal appearance. She exhibits no distension.  Musculoskeletal: Normal range of motion.  Neurological: She is alert and oriented to person, place, and time. She has normal strength. No sensory deficit.  Skin: Skin is warm, dry and intact. Rash noted. There is erythema.  Multiple red raised areas worse in skin creases.  Psychiatric: She has a normal mood and affect. Her behavior is normal.  Nursing note and vitals reviewed.  ED Treatments / Results  DIAGNOSTIC STUDIES: Oxygen Saturation is 100% on RA, normal by my interpretation.    COORDINATION OF CARE: 9:19 PM Discussed treatment plan with pt at bedside and pt agreed to plan. Will try benadryl and prednisone. Pt advised to F/U with her PCP.  Labs (all labs ordered are listed, but only abnormal results are displayed) Labs Reviewed - No data to display  EKG  EKG Interpretation None       Radiology No results  found.  Procedures Procedures (including critical care time)  Medications Ordered in ED Medications  diphenhydrAMINE (BENADRYL) capsule 50 mg (not administered)     Initial Impression / Assessment and Plan / ED Course  I have reviewed the triage vital signs and the nursing notes.  Pertinent labs & imaging results that were available during my care of the patient were reviewed by me and considered in my medical decision making (see chart for details).       Final Clinical Impressions(s) / ED Diagnoses   Final diagnoses:  Rash  Urticaria    New Prescriptions New Prescriptions   PREDNISONE (DELTASONE) 10 MG TABLET    5,4,3,2,1  An After Visit Summary was printed and given to the patient.  I personally performed the services in this documentation, which was scribed in my presence.  The recorded information has been reviewed and considered.   Ronnald Collum.   [place], PA-C 08/26/16 0011    Fredia Sorrow, MD 08/29/16 [phone]

## 2016-08-25 NOTE — ED Notes (Signed)
Brought blanket to patient -- no fever

## 2016-08-25 NOTE — ED Triage Notes (Signed)
Rash over her body x 2 days. States she has hives and has had them before. She has not been taking Benadryl. The rash she has a triage is a small red pimple not hive.

## 2016-08-25 NOTE — Discharge Instructions (Signed)
Benadryl for itching

## 2016-10-17 ENCOUNTER — Other Ambulatory Visit (HOSPITAL_COMMUNITY): Payer: Self-pay | Admitting: General Surgery

## 2016-10-27 ENCOUNTER — Encounter: Payer: Self-pay | Admitting: Skilled Nursing Facility1

## 2016-10-27 ENCOUNTER — Encounter: Payer: Managed Care, Other (non HMO) | Attending: General Surgery | Admitting: Skilled Nursing Facility1

## 2016-10-27 DIAGNOSIS — E669 Obesity, unspecified: Secondary | ICD-10-CM

## 2016-10-27 DIAGNOSIS — Z6841 Body Mass Index (BMI) 40.0 and over, adult: Secondary | ICD-10-CM | POA: Insufficient documentation

## 2016-10-27 DIAGNOSIS — Z713 Dietary counseling and surveillance: Secondary | ICD-10-CM | POA: Insufficient documentation

## 2016-10-27 NOTE — Progress Notes (Signed)
Pre-Op Assessment Visit:  Pre-Operative Sleeve Gastrectomy Surgery/First SWL  Medical Nutrition Therapy:  Appt start time: 9:00 End time:  9:30 Pt states she has lost and gained wight repeatidly which lead her down the road of getting surgery. Pt states she likes time lines with cause and effect. Pt states she is a picky eater and states she has gout. Pt states the following flare her gout: red meat, seafood, soda. Pts assessment letter will be completed this coming Monday. Pt states she is big fast food eater: a challenge with cousin no fast food. Pt states she is lactose intolerant.  Pt states she Will work on chewing well for next month.   Start weight at [facility]: 400.9 BMI: 65.44  24 hr Dietary Recall: First Meal: skipped----oatmeal from [employer] Snack: Second Meal: skipped-----at work: [employer] fish and fries Snack:  Third Meal: skipped----ice cream Snack 11pm-12pm Beverages: sweet tea  Encouraged to engage in 150 minutes of moderate physical activity including cardiovascular and weight baring weekly  Handouts given during visit include:  . Pre-Op Goals . Bariatric Surgery Protein Shakes During the appointment today the following Pre-Op Goals were reviewed with the patient: . Maintain or lose weight as instructed by your surgeon . Make healthy food choices . Begin to limit portion sizes . Limited concentrated sugars and fried foods . Keep fat/sugar in the single digits per serving on          food labels . Practice CHEWING your food  (aim for 30 chews per bite or until applesauce consistency) . Practice not drinking 15 minutes before, during, and 30 minutes after each meal/snack . Avoid all carbonated beverages  . Avoid/limit caffeinated beverages  . Avoid all sugar-sweetened beverages . Consume 3 meals per day; eat every 3-5 hours . Make a list of non-food related activities . Aim for 64-100 ounces of FLUID daily  . Aim for at least 60-80 grams of PROTEIN daily . Look for  a liquid protein source that contain ?15 g protein and ?5 g carbohydrate  (ex: shakes, drinks, shots)  -Follow diet recommendations listed below   Energy and Macronutrient Recomendations: Calories: 1600 Carbohydrate: 180 Protein: 120 Fat: 44  Demonstrated degree of understanding via:  Teach Back  Teaching Method Utilized:  Visual Auditory Hands on  Barriers to learning/adherence to lifestyle change: In the contemplative state of change  Patient to call the Nutrition and Diabetes Education Services to enroll in Pre-Op and Post-Op Nutrition Education when surgery date is scheduled.

## 2016-10-31 ENCOUNTER — Ambulatory Visit: Payer: Managed Care, Other (non HMO) | Admitting: Skilled Nursing Facility1

## 2016-11-08 ENCOUNTER — Institutional Professional Consult (permissible substitution): Payer: Managed Care, Other (non HMO) | Admitting: Neurology

## 2016-11-28 ENCOUNTER — Encounter: Payer: Managed Care, Other (non HMO) | Attending: General Surgery | Admitting: Registered"

## 2016-11-28 ENCOUNTER — Encounter: Payer: Self-pay | Admitting: Registered"

## 2016-11-28 DIAGNOSIS — E669 Obesity, unspecified: Secondary | ICD-10-CM

## 2016-11-28 DIAGNOSIS — Z6841 Body Mass Index (BMI) 40.0 and over, adult: Secondary | ICD-10-CM | POA: Insufficient documentation

## 2016-11-28 DIAGNOSIS — Z713 Dietary counseling and surveillance: Secondary | ICD-10-CM | POA: Insufficient documentation

## 2016-11-28 NOTE — Patient Instructions (Addendum)
-   Incorporate 3 meals a day. Add in breakfast item in the morning: Protein drink option, oatmeal   - Increase physical activity to walking to 15-20 min a day

## 2016-11-28 NOTE — Progress Notes (Signed)
Pre-Op Assessment Visit:  Pre-Operative Sleeve Gastrectomy Surgery  Medical Nutrition Therapy:  Appt start time: 9:30  End time:  10:48  Patient was seen on 11/28/2016 for Pre-Operative Nutrition Assessment. Assessment and letter of approval faxed to [facility] Bariatric Surgery Program coordinator on 11/28/2016.   Pt expectation of surgery: lose weight and reduce medications  Pt expectation of Dietitian: help understanding what she can/cannot eat   Start weight at [facility]: 400.9 Today's weight: 391.5 BMI: 63.19   Pt arrives having lost 9 pounds from previous visit. 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 Pt states she is lazy and doesn't cook much. Pt reports that she usually eats at her mom's about 2 houses away from her. Pt states she may try and lose weight on her own and not follow through with the surgery. Pt reports she doesn't eat a lot of red meat. Pt states her red meat consumption is about 2-3 times per month. Pt states  Pt states she has a low tolerance for pain. Pt states she has tried [employer] television/film/video Protein shake but doesn't like it due to aftertaste. Pt reports she is currently doing a 4 week challenge with some family members that involves eliminating a lot of processed, high sugar foods such a sodas, doughnuts, fast food, and juice. Pt reports she uses lactose-free milk due to being lactose intolerant. Pt states she eats when she's bored or instead of eating she will go to sleep.    Pt states [facility] will try and get approved her after this visit but may need 6 SWL. One SWL visit was completed 10/27/2016. Pt is unsure.    24 hr Dietary Recall: First Meal: skips breakfast Snack: none Second Meal: skips on weekends, tacos, jerk chicken, rice, vegetables, pulled pork sandwich, fries, pancakes Snack: none Third Meal: pork steak, baked potato Snack: none Beverages: tea, water  Encouraged  to engage in 150 minutes of moderate physical activity including cardiovascular and weight baring weekly  Handouts given during visit include:  . Pre-Op Goals . Bariatric Surgery Protein Shakes . Vitamin and Mineral Supplements  During the appointment today the following Pre-Op Goals were reviewed with the patient: . Maintain or lose weight as instructed by your surgeon . Make healthy food choices . Begin to limit portion sizes . Limited concentrated sugars and fried foods . Keep fat/sugar in the single digits per serving on          food labels . Practice CHEWING your food  (aim for 30 chews per bite or until applesauce consistency) . Practice not drinking 15 minutes before, during, and 30 minutes after each meal/snack . Avoid all carbonated beverages  . Avoid/limit caffeinated beverages  . Avoid all sugar-sweetened beverages . Consume 3 meals per day; eat every 3-5 hours . Make a list of non-food related activities . Aim for 64-100 ounces of FLUID daily  . Aim for at least 60-80 grams of PROTEIN daily . Look for a liquid protein source that contain ?15 g protein and ?5 g carbohydrate  (ex: shakes, drinks, shots)  Goals: - Incorporate 3 meals a day. Add in breakfast item in the morning: Protein drink option, oatmeal  - Increase physical activity to walking to 15-20 min a day  Follow diet recommendations listed below  Energy and Macronutrient Recomendations: Calories: 1600 Carbohydrate: 180 Protein: 120 Fat: 44  Demonstrated degree of understanding via:  Teach Back  Teaching Method Utilized:  Visual Auditory Hands on  Barriers to learning/adherence to lifestyle change:  In the contemplative state of change  Patient to call the Nutrition and Diabetes Education Services to enroll in Pre-Op and Post-Op Nutrition Education when surgery date is scheduled.

## 2016-12-01 ENCOUNTER — Ambulatory Visit: Payer: Managed Care, Other (non HMO) | Admitting: Skilled Nursing Facility1

## 2016-12-06 ENCOUNTER — Ambulatory Visit (INDEPENDENT_AMBULATORY_CARE_PROVIDER_SITE_OTHER): Payer: Managed Care, Other (non HMO) | Admitting: Psychology

## 2016-12-13 ENCOUNTER — Ambulatory Visit (INDEPENDENT_AMBULATORY_CARE_PROVIDER_SITE_OTHER): Payer: Managed Care, Other (non HMO) | Admitting: Psychology

## 2016-12-27 ENCOUNTER — Ambulatory Visit: Payer: Managed Care, Other (non HMO) | Admitting: Registered"

## 2016-12-29 ENCOUNTER — Encounter: Payer: Managed Care, Other (non HMO) | Attending: General Surgery | Admitting: Registered"

## 2016-12-29 ENCOUNTER — Encounter: Payer: Self-pay | Admitting: Registered"

## 2016-12-29 DIAGNOSIS — Z6841 Body Mass Index (BMI) 40.0 and over, adult: Secondary | ICD-10-CM | POA: Insufficient documentation

## 2016-12-29 DIAGNOSIS — E669 Obesity, unspecified: Secondary | ICD-10-CM

## 2016-12-29 DIAGNOSIS — Z713 Dietary counseling and surveillance: Secondary | ICD-10-CM | POA: Diagnosis not present

## 2016-12-29 NOTE — Patient Instructions (Addendum)
-   Decrease caffeine intake from sweet tea to once a day.   - Increase physical activity to at least 15-20 minutes at least 5 days per week.

## 2016-12-29 NOTE — Progress Notes (Signed)
Appt start time: 12:00 end time: 12:25  Assessment: 2nd SWL Appointment   Start Wt at [facility]: 400.9 Wt: 393.0 BMI: 63.43   Pt arrives having gained 1.5 lbs from previous visit. Pt states she is working on decreasing tea consumption. Pt states she consumes tea multiple times per day, at least 5 times per week. Pt states she is drinking about 1 c of juice per day. Pt states she averages 3200-3300 steps/day during the week. Pt states she drinks a lot of water at work and she also retains fluid. Pt states it is better for her to work on one thing at a time to prevent from being overwhelmed. Pt also states that she is still thinking about it she wants to do the surgery or not.    MEDICATIONS: See list   DIETARY INTAKE:  24-hr recall:  B ( AM): sausage, egg, and cheese biscuit  Snk ( AM): granola bar L ( PM): protein shake Snk ( PM): none D ( PM): chicken, french fries Snk ( PM): cereal Beverages: Protein shake, water, sweet tea  Usual physical activity: none stated  Diet to Follow: Calories: 1600 Carbohydrate: 180 Protein: 120 Fat: 44  Preferred Learning Style:   No preference indicated   Learning Readiness:   Contemplating  Ready  Change in progress     Nutritional Diagnosis:  Garey-3.3 Overweight/obesity related to past poor dietary habits and physical inactivity as evidenced by patient w/ planned sleeve gastrectomy surgery following dietary guidelines for continued weight loss.    Intervention:  Nutrition counseling for upcoming Bariatric Surgery.  Goals:  - Decrease caffeine intake from sweet tea to once a day.  - Increase physical activity to at least 15-20 minutes at least 5 days per week.  Teaching Method Utilized:  Visual Auditory Hands on  Handouts given during visit include:  Protein Shakes  Barriers to learning/adherence to lifestyle change: lack of personal motivation  Demonstrated degree of understanding via:  Teach Back   Monitoring/Evaluation:   Dietary intake, exercise, and body weight in 1 month(s).

## 2017-01-11 ENCOUNTER — Ambulatory Visit (HOSPITAL_COMMUNITY)
Admission: RE | Admit: 2017-01-11 | Discharge: 2017-01-11 | Disposition: A | Discharge status: Home / Self Care | Payer: Managed Care, Other (non HMO) | Source: Ambulatory Visit | Attending: General Surgery | Admitting: General Surgery

## 2017-01-11 DIAGNOSIS — R9431 Abnormal electrocardiogram [ECG] [EKG]: Secondary | ICD-10-CM | POA: Insufficient documentation

## 2017-01-24 ENCOUNTER — Emergency Department (HOSPITAL_BASED_OUTPATIENT_CLINIC_OR_DEPARTMENT_OTHER): Payer: Managed Care, Other (non HMO)

## 2017-01-24 ENCOUNTER — Encounter (HOSPITAL_BASED_OUTPATIENT_CLINIC_OR_DEPARTMENT_OTHER): Payer: Self-pay | Admitting: *Deleted

## 2017-01-24 ENCOUNTER — Emergency Department (HOSPITAL_BASED_OUTPATIENT_CLINIC_OR_DEPARTMENT_OTHER)
Admission: EM | Admit: 2017-01-24 | Discharge: 2017-01-24 | Disposition: A | Discharge status: Home / Self Care | Payer: Managed Care, Other (non HMO) | Attending: Emergency Medicine | Admitting: Emergency Medicine

## 2017-01-24 DIAGNOSIS — J45909 Unspecified asthma, uncomplicated: Secondary | ICD-10-CM | POA: Diagnosis not present

## 2017-01-24 DIAGNOSIS — M25572 Pain in left ankle and joints of left foot: Secondary | ICD-10-CM | POA: Diagnosis not present

## 2017-01-24 DIAGNOSIS — I10 Essential (primary) hypertension: Secondary | ICD-10-CM

## 2017-01-24 DIAGNOSIS — Z79899 Other long term (current) drug therapy: Secondary | ICD-10-CM | POA: Insufficient documentation

## 2017-01-24 MED ORDER — CLONIDINE HCL 0.1 MG PO TABS
0.2000 mg | ORAL_TABLET | Freq: Once | ORAL | Status: AC
  Administered 2017-01-24: 0.2 mg via ORAL
  Filled 2017-01-24: qty 2

## 2017-01-24 NOTE — ED Provider Notes (Signed)
[facility] DEPT [facility] Provider Note   CSN: [id number] Arrival date & time: 01/24/17  1759  By signing my name below, I, Levester Fresh, attest that this documentation has been prepared under the direction and in the presence of No att. providers found . Electronically Signed: Levester Fresh, Scribe. 01/24/2017. 11:43 PM.   History   Chief Complaint Chief Complaint  Patient presents with  . Ankle Pain   HPI Comments Dawn Thomas is a 41 y.o. female with a PMHx significant for morbid obesity, CKD III, HTN, hypothyroidism and gout, who presents to the Emergency Department with complaints of ankle pain x3 days, only present when bearing weight.  No recent injury or trauma.  Pt noticed the pain after waking up.  Pain is worse with standing to the posterior ankle, and radiates up towards her knee.  Pt has been able to ambulate, bearing weight to the lateral side of her left foot.  Fever with a maxtemp 100-101 last night.  Pt states that the pain is different from her typical gout.  Pt also with hypertension to 264/120.  No headache, chest pain or dyspnea.  DOE, which is pt's baseline.  She reports medication noncompliance with her clonidine 2/2 financial troubles and side effect of sleepiness.  Another complaint of scattered erythematous skin patches to bilateral lower extremities.  No recent tick bites.  Some flaking and itchiness after bathing.  Pt denies experiencing any other acute sx.  The history is provided by the patient and medical records. No language interpreter was used.    Past Medical History:  Diagnosis Date  . Asthma   . Hypertension   . Migraines   . Renal disorder    stage 3  . Renal insufficiency   . Tendonitis   . Thyroid disease     Patient Active Problem List   Diagnosis Date Noted  . Right ankle pain 01/18/2012    Past Surgical History:  Procedure Laterality Date  . CESAREAN SECTION    . CHOLECYSTECTOMY    . DILATION AND CURETTAGE OF UTERUS       OB History    No data available       Home Medications    Prior to Admission medications   Medication Sig Start Date End Date Taking? Authorizing Provider  acetaminophen (TYLENOL) 325 MG tablet Take 650 mg by mouth every 6 (six) hours as needed.    [provider]  albuterol (PROVENTIL HFA;VENTOLIN HFA) 108 (90 BASE) MCG/ACT inhaler Inhale 2 puffs into the lungs every 6 (six) hours as needed. For shortness of breath    [provider]  ALLOPURINOL PO Take by mouth.    [provider]  cetirizine (ZYRTEC ALLERGY) 10 MG tablet Take 1 tablet (10 mg total) by mouth daily. 10/14/15   Waynetta Pean, PA-C  cloNIDine (CATAPRES) 0.1 MG tablet Take 0.2 mg by mouth 2 (two) times daily.    [provider]  LEVOTHYROXINE SODIUM PO Take 75 mcg by mouth.     [provider]  losartan-hydrochlorothiazide (HYZAAR) 100-12.5 MG per tablet Take 1 tablet by mouth daily.      [provider]  METOPROLOL TARTRATE PO Take by mouth.    [provider]  naproxen (NAPROSYN) 250 MG tablet Take 1 tablet (250 mg total) by mouth 2 (two) times daily with a meal. 10/14/15   Waynetta Pean, PA-C  omeprazole (PRILOSEC) 20 MG capsule Take 1 capsule (20 mg total) by mouth daily. Patient not taking:  Reported on 10/27/2016 06/30/16   Montine Circle, PA-C  sucralfate (CARAFATE) 1 GM/10ML suspension Take 10 mLs (1 g total) by mouth 4 (four) times daily -  with meals and at bedtime. 06/30/16   Montine Circle, PA-C  topiramate (TOPAMAX) 100 MG tablet Take 100 mg by mouth 2 (two) times daily.    [provider]  traMADol (ULTRAM) 50 MG tablet Take 1 tablet (50 mg total) by mouth every 6 (six) hours as needed. 04/09/15   Veryl Speak, MD    Family History Family History  Problem Relation Age of Onset  . Diabetes Mother   . Hypertension Mother   . Hypertension Father   . Diabetes Father   . Sudden death Father   . Heart attack Neg Hx   .  Hyperlipidemia Neg Hx     Social History Social History  Substance Use Topics  . Smoking status: Never Smoker  . Smokeless tobacco: Never Used  . Alcohol use No     Allergies   Maxalt [rizatriptan benzoate]; Adhesive [tape]; Hydrocodone; Penicillins; and Zithromax [azithromycin]   Review of Systems Review of Systems  Constitutional: Positive for fever.  Respiratory: Negative for shortness of breath.   Cardiovascular: Negative for chest pain.  Gastrointestinal: Negative for abdominal pain, nausea and vomiting.  Musculoskeletal: Positive for arthralgias.  Neurological: Negative for headaches.  All other systems reviewed and are negative.    Physical Exam Updated Vital Signs BP (!) 223/109   Pulse 63   Resp 18   Ht 5\' 6"  (1.676 m)   Wt (!) 181.4 kg (400 lb)   LMP 01/24/2017   SpO2 100%   BMI 64.56 kg/m   Physical Exam  Constitutional: She is oriented to person, place, and time. She appears well-developed and well-nourished.  HENT:  Head: Normocephalic and atraumatic.  Cardiovascular: Normal rate.   No murmur heard. Pulmonary/Chest: Effort normal and breath sounds normal. No respiratory distress.  Abdominal:  Obese abdomen  Musculoskeletal:  2+ DP pulses bilaterally.  Tenderness to palpation over the left, lateral malleolus. No erythema to the foot or ankle. There is pain with dorsiflexion and plantar flexion of the left ankle. No tenderness over the Achilles or foot.  Neurological: She is alert and oriented to person, place, and time.  Skin: Skin is warm and dry. Capillary refill takes less than 2 seconds.  Psychiatric: She has a normal mood and affect. Her behavior is normal.  Nursing note and vitals reviewed.    ED Treatments / Results  DIAGNOSTIC STUDIES: Oxygen Saturation is 100% on room air, normal by my interpretation.    COORDINATION OF CARE: 7:10 PM Discussed treatment plan with pt at bedside and pt agreed to plan.  Labs (all labs ordered are  listed, but only abnormal results are displayed) Labs Reviewed - No data to display  EKG  EKG Interpretation None       Radiology Dg Ankle Complete Left  Result Date: 01/24/2017 CLINICAL DATA:  Three-day history of left ankle pain and swelling with limited range of motion. No known injuries. EXAM: LEFT ANKLE COMPLETE - 3+ VIEW COMPARISON:  Left foot x-rays 06/25/2014 and 03/31/2006. No prior ankle imaging. FINDINGS: Diffuse soft tissue swelling. No evidence of acute or subacute fracture or dislocation. Ankle mortise intact with well-preserved joint space. Bone mineral density well-preserved. Irregularity involving the lateral cortex of the distal tibia and the medial cortex of the distal fibula may represent an old interosseous membrane injury. No visible joint effusion. Small plantar calcaneal  spur. IMPRESSION: No acute or subacute osseous abnormality. Query old injury involving the distal intraosseous membrane. Small plantar calcaneal spur. Electronically Signed   By: Evangeline Dakin M.D.   On: 01/24/2017 20:09    Procedures Procedures (including critical care time)  Medications Ordered in ED Medications  cloNIDine (CATAPRES) tablet 0.2 mg (0.2 mg Oral Given 01/24/17 1940)     Initial Impression / Assessment and Plan / ED Course  I have reviewed the triage vital signs and the nursing notes.  Pertinent labs & imaging results that were available during my care of the patient were reviewed by me and considered in my medical decision making (see chart for details).     Pt here for evaluation of left ankle swelling, pain with ambulation.  She has ttp over left lateral malleolus.  No evidence of septic or gouty arthritis.  She is NVI on exam.  D/w pt unclear source of sxs, given CKD cannot treat with nsaids.  Placing ace wrap with rest, elevation, outpatient followup.    Pt noted to be markedly hypertensive in ED - asymptomatic and missed her clonidine as well as additional BP meds.   She will be picking up her home BP meds today.  Discussed with patient importance of medication compliance, outpatient followup.  Return precautions discussed.    I personally performed the services described in this documentation, which was scribed in my presence. The recorded information has been reviewed and is accurate.  Final Clinical Impressions(s) / ED Diagnoses   Final diagnoses:  Acute left ankle pain  Essential hypertension    New Prescriptions Discharge Medication List as of 01/24/2017  8:32 PM       Quintella Reichert, MD 01/25/17 1455

## 2017-01-24 NOTE — ED Triage Notes (Signed)
Pt c/o left ankle pain w/o injury x 4 days

## 2017-01-24 NOTE — ED Notes (Signed)
ED Provider at bedside. 

## 2017-01-24 NOTE — ED Notes (Signed)
Pt denies any CP, dizziness, ShOB, or HA.

## 2017-01-26 ENCOUNTER — Encounter: Payer: Managed Care, Other (non HMO) | Attending: General Surgery | Admitting: Registered"

## 2017-01-26 ENCOUNTER — Encounter: Payer: Self-pay | Admitting: Registered"

## 2017-01-26 DIAGNOSIS — E669 Obesity, unspecified: Secondary | ICD-10-CM

## 2017-01-26 DIAGNOSIS — Z6841 Body Mass Index (BMI) 40.0 and over, adult: Secondary | ICD-10-CM | POA: Insufficient documentation

## 2017-01-26 DIAGNOSIS — Z713 Dietary counseling and surveillance: Secondary | ICD-10-CM | POA: Insufficient documentation

## 2017-01-26 NOTE — Progress Notes (Signed)
Appt start time: 5:15 end time: 5:35  Assessment: 3rd SWL Appointment   Start Wt at [facility]: 400.9 Wt: 393.1 BMI: 63.43   Pt arrives 9 min late, having maintained weight from previous visit. Pt states she has tried plant-based protein powder mixed with water and flavor pack and did not like it. Pt states she is going to try bone broth protein powder (20g) and make a soup with it as another option. Pt states she has increased daily activity from 3000 steps/day to 6000 steps/day. Pt states she drinks about 2.5 bottles of water/day and has decreased sweet tea consumption to about 4 teas/week; not drinking daily like before.   Pt states her information may be submitted to information after this visit, but still wants to come for nutrition visits because they are encouraging and motivating for her.    MEDICATIONS: See list   DIETARY INTAKE:  24-hr recall:  B ( AM): 1 oz protein shake or 3/4 chicken biscuit  Snk ( AM): none L ( PM): pulled pork, bbq sauce Snk ( PM): cake D ( PM): pulled pork sandwich Snk ( PM): none Beverages: water, sweet tea  Usual physical activity: walking 6000 steps/day  Diet to Follow: Calories: 1600 Carbohydrate: 180 Protein: 120 Fat: 44  Preferred Learning Style:   No preference indicated   Learning Readiness:   Contemplating  Ready  Change in progress     Nutritional Diagnosis:  [place]-3.3 Overweight/obesity related to past poor dietary habits and physical inactivity as evidenced by patient w/ planned sleeve gastrectomy surgery following dietary guidelines for continued weight loss.    Intervention:  Nutrition counseling for upcoming Bariatric Surgery.  Goals:  - Aim to increase fluid intake to at least 64 oz fluid day. - Find a liquid protein source (powder/drink/shake) that you enjoy that has at least 15g or more protein and 5g or less of carbohydrates.   Teaching Method Utilized:  Visual Auditory Hands on  Handouts given during visit  include:  Protein Shakes  Barriers to learning/adherence to lifestyle change: lack of personal motivation  Demonstrated degree of understanding via:  Teach Back   Monitoring/Evaluation:  Dietary intake, exercise, and body weight in 1 month(s).

## 2017-01-26 NOTE — Patient Instructions (Addendum)
-   Aim to increase fluid intake to at least 64 oz fluid day.  - Find a liquid protein source (powder/drink/shake) that you enjoy that has at least 15g or more protein and 5g or less of carbohydrates.

## 2017-02-22 ENCOUNTER — Encounter: Payer: Managed Care, Other (non HMO) | Attending: General Surgery | Admitting: Registered"

## 2017-02-22 ENCOUNTER — Encounter: Payer: Self-pay | Admitting: Registered"

## 2017-02-22 DIAGNOSIS — Z6841 Body Mass Index (BMI) 40.0 and over, adult: Secondary | ICD-10-CM | POA: Diagnosis not present

## 2017-02-22 DIAGNOSIS — Z713 Dietary counseling and surveillance: Secondary | ICD-10-CM | POA: Insufficient documentation

## 2017-02-22 DIAGNOSIS — E669 Obesity, unspecified: Secondary | ICD-10-CM

## 2017-02-22 NOTE — Progress Notes (Signed)
Appt start time: 12:10 end time: 12:20  Assessment: 4th SWL Appointment   Start Wt at [facility]: 400.9 Wt: 393.5  BMI: 63.51   Pt arrives 10 min late, having maintained weight from previous visit. Pt states she has tried plant-based protein powder mixed with water and flavor pack and did not like it. Pt states she tried bone broth protein powder (20g) and it was tolerable. Pt states she is working on fluid intake; drinks about 3-4 bottles of water daily. Pt states she was able to not eat cake at her daughter's birthday party last week. Pt states she has increased daily activity from 3000 steps/day to 4000-5000 steps/day. Pt states she has decreased sweet tea consumption to about 2 teas/week; not drinking daily like before.   Pt states her paperwork was submitted on 7/9 but enjoys coming to visits with [place] because they are encouraging and motivating for her.    MEDICATIONS: See list   DIETARY INTAKE:  24-hr recall:  B ( AM): 1 oz protein shake or 3/4 chicken biscuit  Snk ( AM): none L ( PM): pulled pork, bbq sauce Snk ( PM): cake D ( PM): pulled pork sandwich Snk ( PM): none Beverages: water, sweet tea  Usual physical activity: walking 6000 steps/day  Diet to Follow: Calories: 1600 Carbohydrate: 180 Protein: 120 Fat: 44  Preferred Learning Style:   No preference indicated   Learning Readiness:   Contemplating  Ready  Change in progress   Nutritional Diagnosis:  [place]-3.3 Overweight/obesity related to past poor dietary habits and physical inactivity as evidenced by patient w/ planned sleeve gastrectomy surgery following dietary guidelines for continued weight loss.    Intervention:  Nutrition counseling for upcoming Bariatric Surgery.  Goals:  - Aim to increase fluid intake to at least 64 oz fluid day.  Teaching Method Utilized:  Visual Auditory  Handouts given during visit include:  none  Barriers to learning/adherence to lifestyle change: lack of personal  motivation  Demonstrated degree of understanding via:  Teach Back   Monitoring/Evaluation:  Dietary intake, exercise, and body weight prn.

## 2017-02-22 NOTE — Patient Instructions (Signed)
-   Aim to increase fluid intake to at least 64 oz fluid day.

## 2017-02-27 ENCOUNTER — Ambulatory Visit: Payer: Self-pay | Admitting: Registered"

## 2017-03-06 ENCOUNTER — Encounter: Payer: Managed Care, Other (non HMO) | Attending: General Surgery | Admitting: Skilled Nursing Facility1

## 2017-03-06 DIAGNOSIS — Z6841 Body Mass Index (BMI) 40.0 and over, adult: Secondary | ICD-10-CM | POA: Insufficient documentation

## 2017-03-06 DIAGNOSIS — E669 Obesity, unspecified: Secondary | ICD-10-CM

## 2017-03-06 DIAGNOSIS — Z713 Dietary counseling and surveillance: Secondary | ICD-10-CM | POA: Insufficient documentation

## 2017-03-07 ENCOUNTER — Encounter: Payer: Self-pay | Admitting: Skilled Nursing Facility1

## 2017-03-07 NOTE — Progress Notes (Signed)
Pre-Operative Nutrition Class:  Appt start time: 3151   End time:  1830.  Patient was seen on 03/06/2017 for Pre-Operative Bariatric Surgery [facility]   Surgery date:  Surgery type: Sleeve Start weight at [facility]: 400.9 Weight today: 392.6  Samples given per MNT protocol. Patient educated on appropriate usage: Celebrate Vitamins Multivitamin Lot # [id number] Exp: 12/2017  Celebrate Vitamins Calcium  Lot # [id number] Exp:dec-03-2017  PrmierProtein Shake Lot #[id number] Exp: 24/jan/2019 The following the learning objectives were met by the patient during this course:  Identify Pre-Op Dietary Goals and will begin 2 weeks pre-operatively  Identify appropriate sources of fluids and proteins   State protein recommendations and appropriate sources pre and post-operatively  Identify Post-Operative Dietary Goals and will follow for 2 weeks post-operatively  Identify appropriate multivitamin and calcium sources  Describe the need for physical activity post-operatively and will follow MD recommendations  State when to call healthcare provider regarding medication questions or post-operative complications  Handouts given during class include:  Pre-Op Bariatric Surgery Diet Handout  Protein Shake Handout  Post-Op Bariatric Surgery Nutrition Handout  BELT Program Information Flyer  Support Group Information Flyer  [facility] Bariatric Supplements Price List  Follow-Up Plan: Patient will follow-up at [facility] 2 weeks post operatively for diet advancement per MD.

## 2017-03-16 ENCOUNTER — Ambulatory Visit (INDEPENDENT_AMBULATORY_CARE_PROVIDER_SITE_OTHER): Payer: Managed Care, Other (non HMO) | Admitting: Psychology

## 2017-03-16 DIAGNOSIS — F509 Eating disorder, unspecified: Secondary | ICD-10-CM

## 2017-04-10 ENCOUNTER — Ambulatory Visit: Payer: 59 | Admitting: Psychology

## 2017-04-25 ENCOUNTER — Ambulatory Visit (INDEPENDENT_AMBULATORY_CARE_PROVIDER_SITE_OTHER): Payer: 59 | Admitting: Psychology

## 2017-04-25 DIAGNOSIS — F4322 Adjustment disorder with anxiety: Secondary | ICD-10-CM | POA: Diagnosis not present

## 2017-04-25 DIAGNOSIS — F509 Eating disorder, unspecified: Secondary | ICD-10-CM

## 2017-05-22 NOTE — Progress Notes (Signed)
Please place orders in EPIC as patient is being scheduled for a pre-op appointment! Thank you! 

## 2017-05-23 ENCOUNTER — Ambulatory Visit: Payer: Self-pay | Admitting: General Surgery

## 2017-05-23 ENCOUNTER — Ambulatory Visit: Payer: Self-pay | Admitting: Psychology

## 2017-05-23 NOTE — Progress Notes (Signed)
Please place orders in EPIC as patient has a pre-op appointment on 05/25/2017! Thank you!

## 2017-05-23 NOTE — Patient Instructions (Signed)
Colleen Kotlarz  05/23/2017   Your procedure is scheduled on: 05/30/2017    Report to [facility] Main  Entrance Take [place]  elevators to 3rd floor to  [place] at    [place] AM.    Call this number if you have problems the morning of surgery [phone]    Remember: ONLY 1 PERSON MAY GO WITH YOU TO SHORT STAY TO GET  READY MORNING OF [place].  Do not eat food or drink liquids :After Midnight.     Take these medicines the morning of surgery with A SIP OF WATER: Inhalers as usual and bring, allopurinol, Clonidine ( Catapre4s), Synthroid, Topamax, Metoprolol ( Lopresssor)                                 You may not have any metal on your body including hair pins and              piercings  Do not wear jewelry, make-up, lotions, powders or perfumes, deodorant             Do not wear nail polish.  Do not shave  48 hours prior to surgery.     Do not bring valuables to the hospital. [place].  Contacts, dentures or bridgework may not be worn into surgery.  Leave suitcase in the car. After surgery it may be brought to your room.                      Please read over the following fact sheets you were given: _____________________________________________________________________             [facility] - Preparing for Surgery Before surgery, you can play an important role.  Because skin is not sterile, your skin needs to be as free of germs as possible.  You can reduce the number of germs on your skin by washing with CHG (chlorahexidine gluconate) soap before surgery.  CHG is an antiseptic cleaner which kills germs and bonds with the skin to continue killing germs even after washing. Please DO NOT use if you have an allergy to CHG or antibacterial soaps.  If your skin becomes reddened/irritated stop using the CHG and inform your nurse when you arrive at Short Stay. Do not shave (including legs and  underarms) for at least 48 hours prior to the first CHG shower.  You may shave your face/neck. Please follow these instructions carefully:  1.  Shower with CHG Soap the night before surgery and the  morning of Surgery.  2.  If you choose to wash your hair, wash your hair first as usual with your  normal  shampoo.  3.  After you shampoo, rinse your hair and body thoroughly to remove the  shampoo.                           4.  Use CHG as you would any other liquid soap.  You can apply chg directly  to the skin and wash                       Gently with a scrungie or clean  washcloth.  5.  Apply the CHG Soap to your body ONLY FROM THE NECK DOWN.   Do not use on face/ open                           Wound or open sores. Avoid contact with eyes, ears mouth and genitals (private parts).                       Wash face,  Genitals (private parts) with your normal soap.             6.  Wash thoroughly, paying special attention to the area where your surgery  will be performed.  7.  Thoroughly rinse your body with warm water from the neck down.  8.  DO NOT shower/wash with your normal soap after using and rinsing off  the CHG Soap.                9.  Pat yourself dry with a clean towel.            10.  Wear clean pajamas.            11.  Place clean sheets on your bed the night of your first shower and do not  sleep with pets. Day of Surgery : Do not apply any lotions/deodorants the morning of surgery.  Please wear clean clothes to the hospital/surgery center.  FAILURE TO FOLLOW THESE INSTRUCTIONS MAY RESULT IN THE CANCELLATION OF YOUR SURGERY PATIENT SIGNATURE_________________________________  NURSE SIGNATURE__________________________________  ________________________________________________________________________

## 2017-05-25 ENCOUNTER — Encounter (HOSPITAL_COMMUNITY): Payer: Self-pay

## 2017-05-25 ENCOUNTER — Encounter (HOSPITAL_COMMUNITY)
Admission: RE | Admit: 2017-05-25 | Discharge: 2017-05-25 | Disposition: A | Discharge status: Home / Self Care | Payer: Managed Care, Other (non HMO) | Source: Ambulatory Visit | Attending: General Surgery | Admitting: General Surgery

## 2017-05-25 DIAGNOSIS — Z6841 Body Mass Index (BMI) 40.0 and over, adult: Secondary | ICD-10-CM | POA: Insufficient documentation

## 2017-05-25 DIAGNOSIS — E669 Obesity, unspecified: Secondary | ICD-10-CM | POA: Diagnosis not present

## 2017-05-25 DIAGNOSIS — Z01812 Encounter for preprocedural laboratory examination: Secondary | ICD-10-CM | POA: Diagnosis not present

## 2017-05-25 HISTORY — DX: Adverse effect of unspecified anesthetic, initial encounter: T41.45XA

## 2017-05-25 HISTORY — DX: Hypothyroidism, unspecified: E03.9

## 2017-05-25 HISTORY — DX: Sleep apnea, unspecified: G47.30

## 2017-05-25 HISTORY — DX: Other complications of anesthesia, initial encounter: T88.59XA

## 2017-05-25 LAB — CBC WITH DIFFERENTIAL/PLATELET
BASOS PCT: 0 %
Basophils Absolute: 0 10*3/uL (ref 0.0–0.1)
EOS ABS: 0.1 10*3/uL (ref 0.0–0.7)
Eosinophils Relative: 2 %
HEMATOCRIT: 39.3 % (ref 36.0–46.0)
HEMOGLOBIN: 12.9 g/dL (ref 12.0–15.0)
LYMPHS ABS: 2 10*3/uL (ref 0.7–4.0)
Lymphocytes Relative: 34 %
MCH: 29.8 pg (ref 26.0–34.0)
MCHC: 32.8 g/dL (ref 30.0–36.0)
MCV: 90.8 fL (ref 78.0–100.0)
MONOS PCT: 4 %
Monocytes Absolute: 0.2 10*3/uL (ref 0.1–1.0)
NEUTROS ABS: 3.5 10*3/uL (ref 1.7–7.7)
NEUTROS PCT: 60 %
Platelets: 233 10*3/uL (ref 150–400)
RBC: 4.33 MIL/uL (ref 3.87–5.11)
RDW: 14.3 % (ref 11.5–15.5)
WBC: 5.9 10*3/uL (ref 4.0–10.5)

## 2017-05-25 LAB — COMPREHENSIVE METABOLIC PANEL
ALBUMIN: 3.9 g/dL (ref 3.5–5.0)
ALK PHOS: 58 U/L (ref 38–126)
ALT: 23 U/L (ref 14–54)
AST: 19 U/L (ref 15–41)
Anion gap: 10 (ref 5–15)
BILIRUBIN TOTAL: 0.9 mg/dL (ref 0.3–1.2)
BUN: 33 mg/dL — AB (ref 6–20)
CALCIUM: 9.2 mg/dL (ref 8.9–10.3)
CO2: 23 mmol/L (ref 22–32)
CREATININE: 2.07 mg/dL — AB (ref 0.44–1.00)
Chloride: 109 mmol/L (ref 101–111)
GFR calc Af Amer: 33 mL/min — ABNORMAL LOW (ref >60)
GFR calc non Af Amer: 29 mL/min — ABNORMAL LOW (ref >60)
GLUCOSE: 112 mg/dL — AB (ref 65–99)
Potassium: 3.9 mmol/L (ref 3.5–5.1)
SODIUM: 142 mmol/L (ref 135–145)
TOTAL PROTEIN: 7.4 g/dL (ref 6.5–8.1)

## 2017-05-25 LAB — ABO/RH: ABO/RH(D): A POS

## 2017-05-25 NOTE — Progress Notes (Signed)
EKG-01/11/17-epic  CXR-08/07/16-epic

## 2017-05-25 NOTE — Progress Notes (Signed)
CMp done 05/25/17 faxed via epic to dr Kieth Brightly.

## 2017-05-29 MED ORDER — GENTAMICIN SULFATE 40 MG/ML IJ SOLN
5.0000 mg/kg | INTRAVENOUS | Status: AC
  Administered 2017-05-30: 520 mg via INTRAVENOUS
  Filled 2017-05-29 (×2): qty 13

## 2017-05-29 MED ORDER — CLINDAMYCIN PHOSPHATE 900 MG/50ML IV SOLN
900.0000 mg | INTRAVENOUS | Status: AC
  Administered 2017-05-30: 900 mg via INTRAVENOUS

## 2017-05-29 MED ORDER — GENTAMICIN SULFATE 40 MG/ML IJ SOLN: INTRAVENOUS | Status: DC

## 2017-05-30 ENCOUNTER — Inpatient Hospital Stay (HOSPITAL_COMMUNITY): Payer: Managed Care, Other (non HMO) | Admitting: Registered Nurse

## 2017-05-30 ENCOUNTER — Encounter (HOSPITAL_COMMUNITY)
Admission: RE | Disposition: A | Discharge status: Home / Self Care | Payer: Self-pay | Source: Ambulatory Visit | Attending: General Surgery

## 2017-05-30 ENCOUNTER — Encounter (HOSPITAL_COMMUNITY): Payer: Self-pay | Admitting: *Deleted

## 2017-05-30 ENCOUNTER — Inpatient Hospital Stay (HOSPITAL_COMMUNITY)
Admission: RE | Admit: 2017-05-30 | Discharge: 2017-06-02 | DRG: 620 | Disposition: A | Discharge status: Home / Self Care | Payer: Managed Care, Other (non HMO) | Source: Ambulatory Visit | Attending: General Surgery | Admitting: General Surgery

## 2017-05-30 DIAGNOSIS — Z6841 Body Mass Index (BMI) 40.0 and over, adult: Secondary | ICD-10-CM

## 2017-05-30 DIAGNOSIS — N179 Acute kidney failure, unspecified: Secondary | ICD-10-CM

## 2017-05-30 DIAGNOSIS — I129 Hypertensive chronic kidney disease with stage 1 through stage 4 chronic kidney disease, or unspecified chronic kidney disease: Secondary | ICD-10-CM | POA: Diagnosis present

## 2017-05-30 DIAGNOSIS — Z880 Allergy status to penicillin: Secondary | ICD-10-CM | POA: Diagnosis not present

## 2017-05-30 DIAGNOSIS — E039 Hypothyroidism, unspecified: Secondary | ICD-10-CM | POA: Diagnosis present

## 2017-05-30 DIAGNOSIS — K449 Diaphragmatic hernia without obstruction or gangrene: Secondary | ICD-10-CM | POA: Diagnosis present

## 2017-05-30 DIAGNOSIS — N183 Chronic kidney disease, stage 3 (moderate): Secondary | ICD-10-CM | POA: Diagnosis present

## 2017-05-30 DIAGNOSIS — G4730 Sleep apnea, unspecified: Secondary | ICD-10-CM | POA: Diagnosis present

## 2017-05-30 DIAGNOSIS — Z833 Family history of diabetes mellitus: Secondary | ICD-10-CM

## 2017-05-30 DIAGNOSIS — Z885 Allergy status to narcotic agent status: Secondary | ICD-10-CM

## 2017-05-30 DIAGNOSIS — Z8249 Family history of ischemic heart disease and other diseases of the circulatory system: Secondary | ICD-10-CM

## 2017-05-30 DIAGNOSIS — R001 Bradycardia, unspecified: Secondary | ICD-10-CM

## 2017-05-30 DIAGNOSIS — R509 Fever, unspecified: Secondary | ICD-10-CM | POA: Diagnosis not present

## 2017-05-30 HISTORY — PX: LAPAROSCOPIC GASTRIC SLEEVE RESECTION: SHX5895

## 2017-05-30 LAB — CREATININE, SERUM
Creatinine, Ser: 2.1 mg/dL — ABNORMAL HIGH (ref 0.44–1.00)
GFR calc Af Amer: 33 mL/min — ABNORMAL LOW (ref >60)
GFR, EST NON AFRICAN AMERICAN: 28 mL/min — AB (ref >60)

## 2017-05-30 LAB — CBC
HEMATOCRIT: 36.4 % (ref 36.0–46.0)
HEMOGLOBIN: 11.6 g/dL — AB (ref 12.0–15.0)
MCH: 28.9 pg (ref 26.0–34.0)
MCHC: 31.9 g/dL (ref 30.0–36.0)
MCV: 90.8 fL (ref 78.0–100.0)
Platelets: 275 10*3/uL (ref 150–400)
RBC: 4.01 MIL/uL (ref 3.87–5.11)
RDW: 14.2 % (ref 11.5–15.5)
WBC: 9.7 10*3/uL (ref 4.0–10.5)

## 2017-05-30 LAB — TYPE AND SCREEN
ABO/RH(D): A POS
Antibody Screen: NEGATIVE

## 2017-05-30 LAB — HEMOGLOBIN AND HEMATOCRIT, BLOOD
HCT: 36 % (ref 36.0–46.0)
Hemoglobin: 11.6 g/dL — ABNORMAL LOW (ref 12.0–15.0)

## 2017-05-30 LAB — PREGNANCY, URINE: Preg Test, Ur: NEGATIVE

## 2017-05-30 SURGERY — GASTRECTOMY, SLEEVE, LAPAROSCOPIC
Anesthesia: General

## 2017-05-30 MED ORDER — LIDOCAINE 2% (20 MG/ML) 5 ML SYRINGE
INTRAMUSCULAR | Status: DC | PRN
  Administered 2017-05-30: 1 mg/kg/h via INTRAVENOUS

## 2017-05-30 MED ORDER — LIDOCAINE 2% (20 MG/ML) 5 ML SYRINGE
INTRAMUSCULAR | Status: AC
  Filled 2017-05-30: qty 10

## 2017-05-30 MED ORDER — ONDANSETRON HCL 4 MG/2ML IJ SOLN
4.0000 mg | INTRAMUSCULAR | Status: DC | PRN
  Administered 2017-05-31: 4 mg via INTRAVENOUS
  Filled 2017-05-30: qty 2

## 2017-05-30 MED ORDER — CLONIDINE HCL 0.2 MG PO TABS
0.2000 mg | ORAL_TABLET | Freq: Two times a day (BID) | ORAL | Status: DC
  Administered 2017-05-31 – 2017-06-02 (×5): 0.2 mg via ORAL
  Filled 2017-05-30 (×6): qty 1

## 2017-05-30 MED ORDER — CHLORHEXIDINE GLUCONATE 4 % EX LIQD: 60.0000 mL | Freq: Once | CUTANEOUS | Status: DC

## 2017-05-30 MED ORDER — ONDANSETRON HCL 4 MG/2ML IJ SOLN
INTRAMUSCULAR | Status: DC | PRN
  Administered 2017-05-30: 4 mg via INTRAVENOUS

## 2017-05-30 MED ORDER — FENTANYL CITRATE (PF) 100 MCG/2ML IJ SOLN
INTRAMUSCULAR | Status: AC
  Filled 2017-05-30: qty 2

## 2017-05-30 MED ORDER — ONDANSETRON HCL 4 MG/2ML IJ SOLN
INTRAMUSCULAR | Status: AC
  Filled 2017-05-30: qty 2

## 2017-05-30 MED ORDER — SUGAMMADEX SODIUM 500 MG/5ML IV SOLN
INTRAVENOUS | Status: AC
  Filled 2017-05-30: qty 5

## 2017-05-30 MED ORDER — LIDOCAINE 2% (20 MG/ML) 5 ML SYRINGE
INTRAMUSCULAR | Status: AC
  Filled 2017-05-30: qty 5

## 2017-05-30 MED ORDER — GABAPENTIN 300 MG PO CAPS
300.0000 mg | ORAL_CAPSULE | ORAL | Status: AC
  Administered 2017-05-30: 300 mg via ORAL
  Filled 2017-05-30: qty 1

## 2017-05-30 MED ORDER — DEXAMETHASONE SODIUM PHOSPHATE 10 MG/ML IJ SOLN
INTRAMUSCULAR | Status: DC | PRN
Start: 2017-05-30 — End: 2017-05-30
  Administered 2017-05-30: 10 mg via INTRAVENOUS

## 2017-05-30 MED ORDER — PREMIER PROTEIN SHAKE
2.0000 [oz_av] | ORAL | Status: DC
  Administered 2017-05-31 – 2017-06-02 (×18): 2 [oz_av] via ORAL

## 2017-05-30 MED ORDER — BUPIVACAINE HCL (PF) 0.25 % IJ SOLN
INTRAMUSCULAR | Status: AC
  Filled 2017-05-30: qty 30

## 2017-05-30 MED ORDER — ALBUTEROL SULFATE (2.5 MG/3ML) 0.083% IN NEBU: 2.5000 mg | INHALATION_SOLUTION | Freq: Four times a day (QID) | RESPIRATORY_TRACT | Status: DC | PRN

## 2017-05-30 MED ORDER — SIMETHICONE 80 MG PO CHEW: 80.0000 mg | CHEWABLE_TABLET | Freq: Four times a day (QID) | ORAL | Status: DC | PRN

## 2017-05-30 MED ORDER — APREPITANT 40 MG PO CAPS
40.0000 mg | ORAL_CAPSULE | ORAL | Status: AC
  Administered 2017-05-30: 40 mg via ORAL
  Filled 2017-05-30: qty 1

## 2017-05-30 MED ORDER — DEXAMETHASONE SODIUM PHOSPHATE 4 MG/ML IJ SOLN: 4.0000 mg | INTRAMUSCULAR | Status: DC

## 2017-05-30 MED ORDER — MIDAZOLAM HCL 2 MG/2ML IJ SOLN
INTRAMUSCULAR | Status: AC
  Filled 2017-05-30: qty 2

## 2017-05-30 MED ORDER — ALBUTEROL SULFATE HFA 108 (90 BASE) MCG/ACT IN AERS: 2.0000 | INHALATION_SPRAY | Freq: Four times a day (QID) | RESPIRATORY_TRACT | Status: DC | PRN

## 2017-05-30 MED ORDER — DEXAMETHASONE SODIUM PHOSPHATE 10 MG/ML IJ SOLN
INTRAMUSCULAR | Status: AC
  Filled 2017-05-30: qty 1

## 2017-05-30 MED ORDER — LACTATED RINGERS IV SOLN
INTRAVENOUS | Status: DC | PRN
  Administered 2017-05-30: 07:00:00 via INTRAVENOUS

## 2017-05-30 MED ORDER — PROMETHAZINE HCL 25 MG/ML IJ SOLN: 6.2500 mg | INTRAMUSCULAR | Status: DC | PRN

## 2017-05-30 MED ORDER — HYDRALAZINE HCL 20 MG/ML IJ SOLN: 10.0000 mg | INTRAMUSCULAR | Status: DC | PRN

## 2017-05-30 MED ORDER — ACETAMINOPHEN 500 MG PO TABS
1000.0000 mg | ORAL_TABLET | ORAL | Status: AC
  Administered 2017-05-30: 1000 mg via ORAL
  Filled 2017-05-30: qty 2

## 2017-05-30 MED ORDER — HEPARIN SODIUM (PORCINE) 5000 UNIT/ML IJ SOLN
5000.0000 [IU] | INTRAMUSCULAR | Status: AC
  Administered 2017-05-30: 5000 [IU] via SUBCUTANEOUS
  Filled 2017-05-30: qty 1

## 2017-05-30 MED ORDER — OXYCODONE HCL 5 MG/5ML PO SOLN
5.0000 mg | ORAL | Status: DC | PRN
  Administered 2017-05-31: 10 mg via ORAL
  Administered 2017-05-31: 5 mg via ORAL
  Administered 2017-05-31: 10 mg via ORAL
  Administered 2017-06-01 (×2): 5 mg via ORAL
  Filled 2017-05-30 (×3): qty 5
  Filled 2017-05-30: qty 10
  Filled 2017-05-30 (×2): qty 5
  Filled 2017-05-30: qty 10

## 2017-05-30 MED ORDER — SCOPOLAMINE 1 MG/3DAYS TD PT72
1.0000 | MEDICATED_PATCH | TRANSDERMAL | Status: DC
  Administered 2017-05-30: 1.5 mg via TRANSDERMAL
  Filled 2017-05-30: qty 1

## 2017-05-30 MED ORDER — TRAMADOL HCL 50 MG PO TABS
50.0000 mg | ORAL_TABLET | Freq: Four times a day (QID) | ORAL | Status: DC | PRN
Start: 2017-05-30 — End: 2017-06-02

## 2017-05-30 MED ORDER — EPHEDRINE 5 MG/ML INJ
INTRAVENOUS | Status: AC
  Filled 2017-05-30: qty 10

## 2017-05-30 MED ORDER — MIDAZOLAM HCL 5 MG/5ML IJ SOLN
INTRAMUSCULAR | Status: DC | PRN
Start: 2017-05-30 — End: 2017-05-30
  Administered 2017-05-30: 2 mg via INTRAVENOUS

## 2017-05-30 MED ORDER — BUPIVACAINE HCL 0.25 % IJ SOLN
INTRAMUSCULAR | Status: DC | PRN
  Administered 2017-05-30: 30 mL

## 2017-05-30 MED ORDER — MORPHINE SULFATE (PF) 2 MG/ML IV SOLN
1.0000 mg | INTRAVENOUS | Status: DC | PRN
  Administered 2017-05-30: 2 mg via INTRAVENOUS
  Filled 2017-05-30: qty 1

## 2017-05-30 MED ORDER — LEVOTHYROXINE SODIUM 75 MCG PO TABS
75.0000 ug | ORAL_TABLET | Freq: Every day | ORAL | Status: DC
  Administered 2017-06-01 – 2017-06-02 (×2): 75 ug via ORAL
  Filled 2017-05-30 (×2): qty 1

## 2017-05-30 MED ORDER — HYDROMORPHONE HCL 1 MG/ML IJ SOLN
INTRAMUSCULAR | Status: AC
  Filled 2017-05-30: qty 2

## 2017-05-30 MED ORDER — TOPIRAMATE 25 MG PO TABS
50.0000 mg | ORAL_TABLET | Freq: Two times a day (BID) | ORAL | Status: DC
  Administered 2017-05-30 – 2017-06-02 (×6): 50 mg via ORAL
  Filled 2017-05-30 (×7): qty 2

## 2017-05-30 MED ORDER — CLINDAMYCIN PHOSPHATE 900 MG/50ML IV SOLN
INTRAVENOUS | Status: AC
  Filled 2017-05-30: qty 50

## 2017-05-30 MED ORDER — ACETAMINOPHEN 160 MG/5ML PO SOLN
650.0000 mg | ORAL | Status: DC | PRN
  Administered 2017-06-01: 650 mg via ORAL
  Filled 2017-05-30 (×2): qty 20.3

## 2017-05-30 MED ORDER — ROCURONIUM BROMIDE 10 MG/ML (PF) SYRINGE
PREFILLED_SYRINGE | INTRAVENOUS | Status: DC | PRN
  Administered 2017-05-30: 50 mg via INTRAVENOUS

## 2017-05-30 MED ORDER — FENTANYL CITRATE (PF) 100 MCG/2ML IJ SOLN
INTRAMUSCULAR | Status: DC | PRN
  Administered 2017-05-30 (×4): 50 ug via INTRAVENOUS

## 2017-05-30 MED ORDER — BUPIVACAINE LIPOSOME 1.3 % IJ SUSP
20.0000 mL | Freq: Once | INTRAMUSCULAR | Status: AC
  Administered 2017-05-30: 20 mL
  Filled 2017-05-30: qty 20

## 2017-05-30 MED ORDER — PROPOFOL 10 MG/ML IV BOLUS
INTRAVENOUS | Status: AC
  Filled 2017-05-30: qty 40

## 2017-05-30 MED ORDER — SUCCINYLCHOLINE CHLORIDE 200 MG/10ML IV SOSY
PREFILLED_SYRINGE | INTRAVENOUS | Status: DC | PRN
  Administered 2017-05-30: 160 mg via INTRAVENOUS

## 2017-05-30 MED ORDER — LACTATED RINGERS IR SOLN
Status: DC | PRN
  Administered 2017-05-30: 100 mL

## 2017-05-30 MED ORDER — SUCCINYLCHOLINE CHLORIDE 200 MG/10ML IV SOSY
PREFILLED_SYRINGE | INTRAVENOUS | Status: AC
  Filled 2017-05-30: qty 10

## 2017-05-30 MED ORDER — EPHEDRINE SULFATE-NACL 50-0.9 MG/10ML-% IV SOSY
PREFILLED_SYRINGE | INTRAVENOUS | Status: DC | PRN
  Administered 2017-05-30 (×2): 5 mg via INTRAVENOUS

## 2017-05-30 MED ORDER — SUGAMMADEX SODIUM 500 MG/5ML IV SOLN
INTRAVENOUS | Status: DC | PRN
  Administered 2017-05-30: 350 mg via INTRAVENOUS

## 2017-05-30 MED ORDER — METOPROLOL TARTRATE 50 MG PO TABS
100.0000 mg | ORAL_TABLET | Freq: Two times a day (BID) | ORAL | Status: DC
  Administered 2017-05-31 – 2017-06-02 (×5): 100 mg via ORAL
  Filled 2017-05-30 (×6): qty 2

## 2017-05-30 MED ORDER — ROCURONIUM BROMIDE 50 MG/5ML IV SOSY
PREFILLED_SYRINGE | INTRAVENOUS | Status: AC
  Filled 2017-05-30: qty 5

## 2017-05-30 MED ORDER — MORPHINE SULFATE (PF) 2 MG/ML IV SOLN: 1.0000 mg | INTRAVENOUS | Status: DC | PRN

## 2017-05-30 MED ORDER — SODIUM CHLORIDE 0.9 % IV SOLN
INTRAVENOUS | Status: DC
  Administered 2017-05-30: 13:00:00 via INTRAVENOUS
  Administered 2017-05-30: 1000 mL via INTRAVENOUS
  Administered 2017-05-31 – 2017-06-01 (×3): via INTRAVENOUS

## 2017-05-30 MED ORDER — PROPOFOL 10 MG/ML IV BOLUS
INTRAVENOUS | Status: DC | PRN
  Administered 2017-05-30: 230 mg via INTRAVENOUS

## 2017-05-30 MED ORDER — LIDOCAINE 2% (20 MG/ML) 5 ML SYRINGE
INTRAMUSCULAR | Status: DC | PRN
  Administered 2017-05-30: 80 mg via INTRAVENOUS

## 2017-05-30 MED ORDER — GLYCOPYRROLATE 0.2 MG/ML IV SOSY
PREFILLED_SYRINGE | INTRAVENOUS | Status: DC | PRN
  Administered 2017-05-30: 0.4 mg via INTRAVENOUS

## 2017-05-30 MED ORDER — PANTOPRAZOLE SODIUM 40 MG IV SOLR
40.0000 mg | Freq: Every day | INTRAVENOUS | Status: DC
  Administered 2017-05-30 – 2017-06-01 (×3): 40 mg via INTRAVENOUS
  Filled 2017-05-30 (×3): qty 40

## 2017-05-30 MED ORDER — GLYCOPYRROLATE 0.2 MG/ML IV SOSY
PREFILLED_SYRINGE | INTRAVENOUS | Status: AC
  Filled 2017-05-30: qty 5

## 2017-05-30 MED ORDER — GLUCAGON HCL RDNA (DIAGNOSTIC) 1 MG IJ SOLR
1.0000 mg | Freq: Once | INTRAMUSCULAR | Status: AC
  Administered 2017-05-30: 1 mg via INTRAVENOUS
  Filled 2017-05-30: qty 1

## 2017-05-30 MED ORDER — ENOXAPARIN SODIUM 30 MG/0.3ML ~~LOC~~ SOLN
30.0000 mg | Freq: Two times a day (BID) | SUBCUTANEOUS | Status: DC
  Administered 2017-05-30 – 2017-06-02 (×6): 30 mg via SUBCUTANEOUS
  Filled 2017-05-30 (×5): qty 0.3

## 2017-05-30 MED ORDER — HYDROMORPHONE HCL 1 MG/ML IJ SOLN
0.2500 mg | INTRAMUSCULAR | Status: DC | PRN
  Administered 2017-05-30 (×2): 0.5 mg via INTRAVENOUS

## 2017-05-30 MED ORDER — INFLUENZA VAC SPLIT QUAD 0.5 ML IM SUSY
0.5000 mL | PREFILLED_SYRINGE | INTRAMUSCULAR | Status: DC
  Filled 2017-05-30: qty 0.5

## 2017-05-30 SURGICAL SUPPLY — 56 items
APPLIER CLIP 5 13 M/L LIGAMAX5 (MISCELLANEOUS)
APPLIER CLIP ROT 13.4 12 LRG (CLIP)
BAG LAPAROSCOPIC 12 15 PORT 16 (BASKET) ×1 IMPLANT
BAG RETRIEVAL 12/15 (BASKET) ×2
BANDAGE ADH SHEER 1  50/CT (GAUZE/BANDAGES/DRESSINGS) ×12 IMPLANT
BENZOIN TINCTURE PRP APPL 2/3 (GAUZE/BANDAGES/DRESSINGS) ×2 IMPLANT
BLADE SURG SZ11 CARB STEEL (BLADE) ×2 IMPLANT
CABLE HIGH FREQUENCY MONO STRZ (ELECTRODE) ×2 IMPLANT
CHLORAPREP W/TINT 26ML (MISCELLANEOUS) ×4 IMPLANT
CLIP APPLIE 5 13 M/L LIGAMAX5 (MISCELLANEOUS) IMPLANT
CLIP APPLIE ROT 13.4 12 LRG (CLIP) IMPLANT
COVER SURGICAL LIGHT HANDLE (MISCELLANEOUS) ×2 IMPLANT
DRAIN CHANNEL 19F RND (DRAIN) IMPLANT
DRAPE UNIVERSAL PACK (DRAPES) ×2 IMPLANT
ELECT REM PT RETURN 15FT ADLT (MISCELLANEOUS) ×2 IMPLANT
EVACUATOR SILICONE 100CC (DRAIN) IMPLANT
GAUZE SPONGE 4X4 12PLY STRL (GAUZE/BANDAGES/DRESSINGS) IMPLANT
GLOVE BIOGEL PI IND STRL 7.0 (GLOVE) ×1 IMPLANT
GLOVE BIOGEL PI INDICATOR 7.0 (GLOVE) ×1
GLOVE SURG SS PI 7.0 STRL IVOR (GLOVE) ×2 IMPLANT
GOWN STRL REUS W/TWL LRG LVL3 (GOWN DISPOSABLE) ×6 IMPLANT
GOWN STRL REUS W/TWL XL LVL3 (GOWN DISPOSABLE) ×2 IMPLANT
GRASPER SUT TROCAR 14GX15 (MISCELLANEOUS) ×2 IMPLANT
HANDLE STAPLE EGIA 4 XL (STAPLE) ×2 IMPLANT
HOVERMATT SINGLE USE (MISCELLANEOUS) ×2 IMPLANT
KIT BASIN OR (CUSTOM PROCEDURE TRAY) ×2 IMPLANT
MARKER SKIN DUAL TIP RULER LAB (MISCELLANEOUS) ×2 IMPLANT
NEEDLE SPNL 22GX3.5 QUINCKE BK (NEEDLE) ×2 IMPLANT
NS IRRIG 1000ML POUR BTL (IV SOLUTION) ×2 IMPLANT
RELOAD EGIA 45 MED/THCK PURPLE (STAPLE) ×2 IMPLANT
RELOAD EGIA 60 MED/THCK PURPLE (STAPLE) ×4 IMPLANT
RELOAD EGIA BLACK ROTIC 45MM (STAPLE) IMPLANT
RELOAD TRI 2.0 60 XTHK VAS SUL (STAPLE) ×6 IMPLANT
SCISSORS LAP 5X45 EPIX DISP (ENDOMECHANICALS) ×2 IMPLANT
SET IRRIG TUBING LAPAROSCOPIC (IRRIGATION / IRRIGATOR) ×2 IMPLANT
SHEARS HARMONIC ACE PLUS 45CM (MISCELLANEOUS) ×2 IMPLANT
SLEEVE GASTRECTOMY 40FR VISIGI (MISCELLANEOUS) ×2 IMPLANT
SLEEVE XCEL OPT CAN 5 100 (ENDOMECHANICALS) ×4 IMPLANT
SOLUTION ANTI FOG 6CC (MISCELLANEOUS) ×2 IMPLANT
SPONGE LAP 18X18 X RAY DECT (DISPOSABLE) ×2 IMPLANT
STRIP CLOSURE SKIN 1/2X4 (GAUZE/BANDAGES/DRESSINGS) ×2 IMPLANT
SUT ETHIBOND 0 36 GRN (SUTURE) ×4 IMPLANT
SUT ETHILON 2 0 PS N (SUTURE) IMPLANT
SUT MNCRL AB 4-0 PS2 18 (SUTURE) ×2 IMPLANT
SUT SILK 2 0 SH (SUTURE) ×2 IMPLANT
SUT VICRYL 0 TIES 12 18 (SUTURE) ×2 IMPLANT
SYR 20CC LL (SYRINGE) ×2 IMPLANT
SYR 50ML LL SCALE MARK (SYRINGE) ×2 IMPLANT
TOWEL OR 17X26 10 PK STRL BLUE (TOWEL DISPOSABLE) ×2 IMPLANT
TOWEL OR NON WOVEN STRL DISP B (DISPOSABLE) ×2 IMPLANT
TROCAR BLADELESS 15MM (ENDOMECHANICALS) ×2 IMPLANT
TROCAR BLADELESS OPT 5 100 (ENDOMECHANICALS) ×2 IMPLANT
TUBE CALIBRATION LAPBAND (TUBING) ×2 IMPLANT
TUBING CONNECTING 10 (TUBING) ×4 IMPLANT
TUBING ENDO SMARTCAP PENTAX (MISCELLANEOUS) ×2 IMPLANT
TUBING INSUF HEATED (TUBING) ×2 IMPLANT

## 2017-05-30 NOTE — Progress Notes (Signed)
Discussed post op day goals with patient including ambulation, IS, diet progression, pain, and nausea control.  Questions answered. 

## 2017-05-30 NOTE — Discharge Instructions (Signed)
° ° ° °GASTRIC BYPASS/SLEEVE ° Home Care Instructions ° ° These instructions are to help you care for yourself when you go home. ° °Call: If you have any problems. °• Call [phone] and ask for the surgeon on call °• If you need immediate assistance come to the ER at . Tell the ER staff you are a new post-op gastric bypass or gastric sleeve patient  °Signs and symptoms to report: • Severe  vomiting or nausea °o If you cannot handle clear liquids for longer than 1 day, call your surgeon °• Abdominal pain which does not get better after taking your pain medication °• Fever greater than 100.4°  F and chills °• Heart rate over 100 beats a minute °• Trouble breathing °• Chest pain °• Redness,  swelling, drainage, or foul odor at incision (surgical) sites °• If your incisions open or pull apart °• Swelling or pain in calf (lower leg) °• Diarrhea (Loose bowel movements that happen often), frequent watery, uncontrolled bowel movements °• Constipation, (no bowel movements for 3 days) if this happens: °o Take Milk of Magnesia, 2 tablespoons by mouth, 3 times a day for 2 days if needed °o Stop taking Milk of Magnesia once you have had a bowel movement °o Call your doctor if constipation continues °Or °o Take Miralax  (instead of Milk of Magnesia) following the label instructions °o Stop taking Miralax once you have had a bowel movement °o Call your doctor if constipation continues °• Anything you think is “abnormal for you” °  °Normal side effects after surgery: • Unable to sleep at night or unable to concentrate °• Irritability °• Being tearful (crying) or depressed ° °These are common complaints, possibly related to your anesthesia, stress of surgery, and change in lifestyle, that usually go away a few weeks after surgery. If these feelings continue, call your medical doctor.  °Wound Care: You may have surgical glue, steri-strips, or staples over your incisions after surgery °• Surgical glue: Looks like clear  film over your incisions and will wear off a little at a time °• Steri-strips: Adhesive strips of tape over your incisions. You may notice a yellowish color on skin under the steri-strips. This is used to make the steri-strips stick better. Do not pull the steri-strips off - let them fall off °• Staples: Staples may be removed before you leave the hospital °o If you go home with staples, call [facility] for an appointment with your surgeon’s nurse to have staples removed 10 days after surgery, [phone] °• Showering: You may shower two (2) days after your surgery unless your surgeon tells you differently °o Wash gently around incisions with warm soapy water, rinse well, and gently pat dry °o If you have a drain (tube from your incision), you may need someone to hold this while you shower °o No tub baths until staples are removed and incisions are healed °  °Medications: • Medications should be liquid or crushed if larger than the size of a dime °• Extended release pills (medication that releases a little bit at a time through the  day) should not be crushed °• Depending on the size and number of medications you take, you may need to space (take a few throughout the day)/change the time you take your medications so that you do not over-fill your pouch (smaller stomach) °• Make sure you follow-up with you primary care physician to make medication changes needed during rapid weight loss and life -style changes °•   If you have diabetes, follow up with your doctor that orders your diabetes medication(s) within one week after surgery and check your blood sugar regularly ° °• Do not drive while taking narcotics (pain medications) ° °• Do not take acetaminophen (Tylenol) and Roxicet or Lortab Elixir at the same time since these pain medications contain acetaminophen °  °Diet:  °First 2 Weeks You will see the nutritionist about two (2) weeks after your surgery. The nutritionist will increase the types of  foods you can eat if you are handling liquids well: °• If you have severe vomiting or nausea and cannot handle clear liquids lasting longer than 1 day call your surgeon °Protein Shake °• Drink at least 2 ounces of shake 5-6 times per day °• Each serving of protein shakes (usually 8-12 ounces) should have a minimum of: °o 15 grams of protein °o And no more than 5 grams of carbohydrate °• Goal for protein each day: °o Men = 80 grams per day °o Women = 60 grams per day °  ° • Protein powder may be added to fluids such as non-fat milk or Lactaid milk or Soy milk (limit to 35 grams added protein powder per serving) ° °Hydration °• Slowly increase the amount of water and other clear liquids as tolerated (See Acceptable Fluids) °• Slowly increase the amount of protein shake as tolerated °• Sip fluids slowly and throughout the day °• May use sugar substitutes in small amounts (no more than 6-8 packets per day; i.e. Splenda) ° °Fluid Goal °• The first goal is to drink at least 8 ounces of protein shake/drink per day (or as directed by the nutritionist); some examples of protein shakes are Syntrax Nectar, Adkins Advantage, EAS Edge HP, and Unjury. - See handout from pre-op Bariatric Education Class: °o Slowly increase the amount of protein shake you drink as tolerated °o You may find it easier to slowly sip shakes throughout the day °o It is important to get your proteins in first °• Your fluid goal is to drink 64-100 ounces of fluid daily °o It may take a few weeks to build up to this  °• 32 oz. (or more) should be clear liquids °And °• 32 oz. (or more) should be full liquids (see below for examples) °• Liquids should not contain sugar, caffeine, or carbonation ° °Clear Liquids: °• Water of Sugar-free flavored water (i.e. Fruit H²O, Propel) °• Decaffeinated coffee or tea (sugar-free) °• Crystal lite, Wyler’s Lite, Minute Maid Lite °• Sugar-free Jell-O °• Bouillon or broth °• Sugar-free Popsicle:    - Less than 20 calories  each; Limit 1 per day ° °Full Liquids: °                  Protein Shakes/Drinks + 2 choices per day of other full liquids °• Full liquids must be: °o No More Than 12 grams of Carbs per serving °o No More Than 3 grams of Fat per serving °• Strained low-fat cream soup °• Non-Fat milk °• Fat-free Lactaid Milk °• Sugar-free yogurt (Dannon Lite & Fit, Greek yogurt) ° °  °Vitamins and Minerals • Start 1 day after surgery unless otherwise directed by your surgeon °• 2 Chewable Bariatric Multivitamin / Multimineral Supplement with iron °• Chewable Calcium Citrate with Vitamin D-3 °(Example: 3 Chewable Calcium  Plus 600 with Vitamin D-3) °o Take 500 mg three (3) times a day for a total of 1500 mg each day °o Do not take all 3 doses of calcium   at one time as it may cause constipation, and you can only absorb 500 mg at a time °o Do not mix multivitamins containing iron with calcium supplements;  take 2 hours apart °• Menstruating women and those at risk for anemia ( a blood disease that causes weakness) may need extra iron °o Talk to your doctor to see if you need more iron °• If you need extra iron: Total daily Iron recommendation (including Vitamins) is 50 to 100 mg Iron/day °• Do not stop taking or change any vitamins or minerals until you talk to your nutritionist or surgeon °• Your nutritionist and/or surgeon must approve all vitamin and mineral supplements °  °Activity and Exercise: It is important to continue walking at home. Limit your physical activity as instructed by your doctor. During this time, use these guidelines: °• Do not lift anything greater than ten  (10) pounds for at least two (2) weeks °• Do not go back to work or drive until your surgeon says you can °• You may have sex when you feel comfortable °o It is VERY important for female patients to use a reliable birth control method; fertility often increase after surgery °o Do not get pregnant for at least 18 months °• Start exercising as soon as your  doctor tells you that you can °o Make sure your doctor approves any physical activity °• Start with a simple walking program °• Walk 5-15 minutes each day, 7 days per week °• Slowly increase until you are walking 30-45 minutes per day °• Consider joining our BELT program. [phone] or email [email or web address] °  °Special Instructions Things to remember: °• Use your CPAP when sleeping if this applies to you °• Consider buying a medical alert bracelet that says you had lap-band surgery °  °  You will likely have your first fill (fluid added to your band) 6 - 8 weeks after surgery °• [facility] has a free Bariatric Surgery Support Group that meets monthly, the 3rd Thursday, 6pm. [employer] [facility] Classrooms. You can see classes online at [email or web address] °• It is very important to keep all follow up appointments with your surgeon, nutritionist, primary care physician, and behavioral health practitioner °o After the first year, please follow up with your bariatric surgeon and nutritionist at least once a year in order to maintain best weight loss results °      °             [facility]:  [phone] ° °             [facility]: [phone] ° °             Bariatric Nurse Coordinator: [phone]  °Gastric Bypass/Sleeve Home Care Instructions  Rev. 09/2012    ° °                                                    Reviewed and Endorsed °                                                   by [facility] Patient Education Committee, Jan, 2014 ° ° ° ° ° ° ° ° ° °

## 2017-05-30 NOTE — Anesthesia Preprocedure Evaluation (Signed)
Anesthesia Evaluation  Patient identified by MRN, date of birth, ID band Patient awake    Reviewed: Allergy & Precautions, NPO status , Patient's Chart, lab work & pertinent test results  Airway Mallampati: II  TM Distance: >3 FB Neck ROM: Full    Dental no notable dental hx.    Pulmonary sleep apnea ,    breath sounds clear to auscultation + decreased breath sounds      Cardiovascular hypertension, Normal cardiovascular exam Rhythm:Regular Rate:Normal     Neuro/Psych negative neurological ROS  negative psych ROS   GI/Hepatic negative GI ROS, Neg liver ROS,   Endo/Other  Hypothyroidism Morbid obesity  Renal/GU Renal InsufficiencyRenal disease  negative genitourinary   Musculoskeletal negative musculoskeletal ROS (+)   Abdominal (+) + obese,   Peds negative pediatric ROS (+)  Hematology negative hematology ROS (+)   Anesthesia Other Findings   Reproductive/Obstetrics negative OB ROS                             Anesthesia Physical Anesthesia Plan  ASA: III  Anesthesia Plan: General   Post-op Pain Management:    Induction: Intravenous  PONV Risk Score and Plan: 3 and Ondansetron, Dexamethasone, Midazolam, Scopolamine patch - Pre-op and Treatment may vary due to age or medical condition  Airway Management Planned: Oral ETT  Additional Equipment:   Intra-op Plan:   Post-operative Plan: Extubation in OR  Informed Consent: I have reviewed the patients History and Physical, chart, labs and discussed the procedure including the risks, benefits and alternatives for the proposed anesthesia with the patient or authorized representative who has indicated his/her understanding and acceptance.   Dental advisory given  Plan Discussed with: CRNA and Surgeon  Anesthesia Plan Comments:         Anesthesia Quick Evaluation

## 2017-05-30 NOTE — Anesthesia Procedure Notes (Signed)
Procedure Name: Intubation Date/Time: 05/30/2017 7:33 AM Performed by: Carleene Cooper A Pre-anesthesia Checklist: Patient identified, Emergency Drugs available, Suction available, Patient being monitored and Timeout performed Patient Re-evaluated:Patient Re-evaluated prior to induction Oxygen Delivery Method: Circle system utilized Preoxygenation: Pre-oxygenation with 100% oxygen Induction Type: IV induction Ventilation: Mask ventilation without difficulty Laryngoscope Size: Mac and 4 Grade View: Grade I Tube type: Oral Tube size: 7.5 mm Number of attempts: 1 Airway Equipment and Method: Stylet Placement Confirmation: ETT inserted through vocal cords under direct vision,  positive ETCO2 and breath sounds checked- equal and bilateral Secured at: 21 cm Tube secured with: Tape Dental Injury: Teeth and Oropharynx as per pre-operative assessment

## 2017-05-30 NOTE — Anesthesia Postprocedure Evaluation (Signed)
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 AND HIATAL HERNIA REPAIR (N/A )     Patient location during evaluation: PACU Anesthesia Type: General Level of consciousness: awake and alert Pain management: pain level controlled Vital Signs Assessment: post-procedure vital signs reviewed and stable Respiratory status: spontaneous breathing, nonlabored ventilation, respiratory function stable and patient connected to nasal cannula oxygen Cardiovascular status: blood pressure returned to baseline and stable Postop Assessment: no apparent nausea or vomiting Anesthetic complications: no    Last Vitals:  Vitals:   05/30/17 1000 05/30/17 1015  BP: (!) 111/57 (!) 101/50  Pulse: (!) 43 (!) 45  Resp: 19 14  Temp:    SpO2: 99% 97%    Last Pain:  Vitals:   05/30/17 1015  TempSrc:   PainSc: 5                  Florita Nitsch S

## 2017-05-30 NOTE — H&P (Addendum)
Dawn Thomas is an 41 y.o. female.   Chief Complaint: obesity HPI: 41 yo female with sleep apnea, hypertension, CKD III and morbid obesity presents for bariatric surgery. She is now ready to undergo sleeve gastrectomy.  Past Medical History:  Diagnosis Date  . Asthma   . Complication of anesthesia    woke up during surgery   . Hypertension   . Hypothyroidism   . Migraines   . Renal disorder    stage 3- Dr [facility] - Dr Olivia Mackie  . Renal insufficiency   . Sleep apnea    mild   . Tendonitis   . Thyroid disease     Past Surgical History:  Procedure Laterality Date  . CESAREAN SECTION    . CHOLECYSTECTOMY    . DILATION AND CURETTAGE OF UTERUS      Family History  Problem Relation Age of Onset  . Diabetes Mother   . Hypertension Mother   . Hypertension Father   . Diabetes Father   . Sudden death Father   . Heart attack Neg Hx   . Hyperlipidemia Neg Hx    Social History:  reports that she has never smoked. She has never used smokeless tobacco. She reports that she does not drink alcohol or use drugs.  Allergies:  Allergies  Allergen Reactions  . Maxalt [Rizatriptan Benzoate] Shortness Of Breath  . Nsaids Other (See Comments)    Renal insufficiency.  . Omeprazole Hives  . Adhesive [Tape] Other (See Comments)    Burns skin  . Hydrocodone Hives  . Penicillins Hives  . Zithromax [Azithromycin] Other (See Comments)    "messes with my breathing"    Medications Prior to Admission  Medication Sig Dispense Refill  . albuterol (PROVENTIL HFA;VENTOLIN HFA) 108 (90 BASE) MCG/ACT inhaler Inhale 2 puffs into the lungs every 6 (six) hours as needed for wheezing or shortness of breath.     . allopurinol (ZYLOPRIM) 300 MG tablet Take 300 mg by mouth daily.     . cloNIDine (CATAPRES) 0.2 MG tablet Take 0.2 mg by mouth 2 (two) times daily.    . ergocalciferol (VITAMIN D2) 50000 units capsule Take 50,000 Units by mouth every Monday.    . furosemide (LASIX) 20 MG tablet Take 20  mg by mouth daily.    Marland Kitchen levothyroxine (SYNTHROID, LEVOTHROID) 75 MCG tablet Take 75 mcg by mouth daily.     Marland Kitchen losartan-hydrochlorothiazide (HYZAAR) 100-25 MG tablet Take 1 tablet by mouth daily.      . metoprolol tartrate (LOPRESSOR) 100 MG tablet Take 100 mg by mouth 2 (two) times daily.    Marland Kitchen topiramate (TOPAMAX) 50 MG tablet Take 50 mg by mouth 2 (two) times daily.     . cetirizine (ZYRTEC ALLERGY) 10 MG tablet Take 1 tablet (10 mg total) by mouth daily. (Patient not taking: Reported on 05/23/2017) 30 tablet 1  . naproxen (NAPROSYN) 250 MG tablet Take 1 tablet (250 mg total) by mouth 2 (two) times daily with a meal. (Patient not taking: Reported on 05/23/2017) 30 tablet 0  . omeprazole (PRILOSEC) 20 MG capsule Take 1 capsule (20 mg total) by mouth daily. (Patient not taking: Reported on 10/27/2016) 30 capsule 0  . sucralfate (CARAFATE) 1 GM/10ML suspension Take 10 mLs (1 g total) by mouth 4 (four) times daily -  with meals and at bedtime. (Patient not taking: Reported on 05/23/2017) 420 mL 0  . traMADol (ULTRAM) 50 MG tablet Take 1 tablet (50 mg total) by mouth every 6 (  six) hours as needed. (Patient taking differently: Take 50 mg by mouth every 6 (six) hours as needed for moderate pain. ) 15 tablet 0    No results found for this or any previous visit (from the past 48 hour(s)). No results found.  Review of Systems  Constitutional: Negative for chills and fever.  HENT: Negative for hearing loss.   Eyes: Negative for blurred vision and double vision.  Respiratory: Negative for cough and hemoptysis.   Cardiovascular: Negative for chest pain and palpitations.  Gastrointestinal: Negative for abdominal pain, nausea and vomiting.  Genitourinary: Negative for dysuria and urgency.  Musculoskeletal: Negative for myalgias and neck pain.  Skin: Negative for itching and rash.  Neurological: Negative for dizziness, tingling and headaches.  Endo/Heme/Allergies: Does not bruise/bleed easily.   Psychiatric/Behavioral: Negative for depression and suicidal ideas.    Blood pressure (!) 160/94, pulse 62, temperature 98.6 F (37 C), temperature source Oral, resp. rate 16, height 5\' 6"  (1.676 m), weight (!) 165.8 kg (365 lb 8 oz), SpO2 99 %. Physical Exam  Vitals reviewed. Constitutional: She is oriented to person, place, and time. She appears well-developed and well-nourished.  HENT:  Head: Normocephalic and atraumatic.  Eyes: Pupils are equal, round, and reactive to light. Conjunctivae and EOM are normal.  Neck: Normal range of motion. Neck supple.  Cardiovascular: Normal rate and regular rhythm.   Respiratory: Effort normal and breath sounds normal.  GI: Soft. Bowel sounds are normal. She exhibits no distension. There is no tenderness.  Musculoskeletal: Normal range of motion.  Neurological: She is alert and oriented to person, place, and time.  Skin: Skin is warm and dry.  Psychiatric: She has a normal mood and affect. Her behavior is normal.     Assessment/Plan 41 yo female with morbid obesity -lap sleeve gastrectomy -ERAS protocol  Mickeal Skinner, MD 05/30/2017, 6:57 AM

## 2017-05-30 NOTE — Transfer of Care (Signed)
Immediate Anesthesia Transfer of Care Note  Patient: Dawn Thomas  Procedure(s) Performed: LAPAROSCOPIC GASTRIC SLEEVE RESECTION WITH UPPPER ENDO AND HIATAL HERNIA REPAIR (N/A )  Patient Location: PACU  Anesthesia Type:General  Level of Consciousness: awake, alert , oriented and patient cooperative  Airway & Oxygen Therapy: Patient Spontanous Breathing and Patient connected to face mask oxygen  Post-op Assessment: Report given to RN, Post -op Vital signs reviewed and stable and Patient moving all extremities  Post vital signs: Reviewed and stable  Last Vitals:  Vitals:   05/30/17 0535 05/30/17 0604  BP: (!) 177/90 (!) 160/94  Pulse: 62   Resp: 16   Temp: 37 C   SpO2: 99%     Last Pain:  Vitals:   05/30/17 0535  TempSrc: Oral      Patients Stated Pain Goal: 3 (52/17/47 1595)  Complications: No apparent anesthesia complications

## 2017-05-30 NOTE — Op Note (Signed)
Preoperative diagnosis: laparoscopic sleeve gastrectomy  Postoperative diagnosis: Same   Procedure: Upper endoscopy   Surgeon: Clovis Riley, M.D.  Anesthesia: Gen.   Indications for procedure: This patient was undergoing a laparoscopic sleeve gastrectomy.   Description of procedure: The endoscopy was placed in the mouth and into the oropharynx and under endoscopic vision it was advanced to the esophagogastric junction. A moderate amount of bile was present in the esophagus and in the antrum. The GEJ was identified at 42cm from the teeth. The pouch was insufflated and no bleeding or bubbles were seen. There was no undue angulation or narrowing at the incisura. No bleeding or leaks were detected. The scope was withdrawn without difficulty.   Clovis Riley, M.D. General, Bariatric, & Minimally Invasive Surgery [facility]

## 2017-05-30 NOTE — Progress Notes (Signed)
Call made to respiratory and spoke with Chrissie Noa. Informed him that CPAP was ordered by Dr. Kieth Brightly but patient does not have a mask to bring from home. Providence Crosby that sister states patient does not wear CPAP at home and that patient has a diagnosis of sleep apnea per MD notes. Donne Hazel, RN

## 2017-05-30 NOTE — Op Note (Signed)
Preop Diagnosis: Obesity Class III  Postop Diagnosis: same  Procedure performed: laparoscopic Sleeve Gastrectomy  Assitant: Romana Juniper  Indications:  The patient is a 41 y.o. year-old morbidly obese female who has been followed in the Bariatric Clinic as an outpatient. This patient was diagnosed with morbid obesity with a BMI of Body mass index is 58.99 kg/m. and significant co-morbidities including hypertension, osteoarthritis and sleep apnea.  The patient was counseled extensively in the Bariatric Outpatient Clinic and after a thorough explanation of the risks and benefits of surgery (including death from complications, bowel leak, infection such as peritonitis and/or sepsis, internal hernia, bleeding, need for blood transfusion, bowel obstruction, organ failure, pulmonary embolus, deep venous thrombosis, wound infection, incisional hernia, skin breakdown, and others entailed on the consent form) and after a compliant diet and exercise program, the patient was scheduled for an elective laparoscopic sleeve gastrectomy.  Description of Operation:  Following informed consent, the patient was taken to the operating room and placed on the operating table in the supine position.  She had previously received prophylactic antibiotics and subcutaneous heparin for DVT prophylaxis in the pre-op holding area.  After induction of general endotracheal anesthesia by the anesthesiologist, the patient underwent placement of sequential compression devices, Foley catheter and an oro-gastric tube.  A timeout was confirmed by the surgery and anesthesia teams.  The patient was adequately padded at all pressure points and placed on a footboard to prevent slippage from the OR table during extremes of position during surgery.  She underwent a routine sterile prep and drape of her entire abdomen.    Next, A transverse incision was made under the left subcostal area and a 8mm optical viewing trocar was introduced into the  peritoneal cavity. Pneumoperitoneum was applied with a high flow and low pressure. A laparoscope was inserted to confirm placement. A extraperitoneal block was then placed at the lateral abdominal wall using exparel diluted with marcaine. 5 additional incisions were placed: 1 69mm trocar to the left of the midline. 1 additional 76mm trocar in the left lateral area, 1 48mm trocar in the right mid abdomen, 1 51mm trocar in the right subcostal area, and a Nathanson retractor was placed through a subxiphoid incision.  There was a visible dimple at the hiatus, therefore it was tested with a calibration tube and found to have a small hiatal hernia. The UGI showed a small hiatal hernia. Therefore, the pars flaccida was incised with harmonic scalpel. The stomach was reduced but on dissection of the posterior crus there was a visible hernia with small sac. The sac was dissected free and 1 0 ethibond sutures placed in interrupted fashion. A calibration tube was passed to ensure appropriate size of the hiatus.  The fat pad at the GE junction was incised and the gastrodiaphragmatic ligament was divided using the Harmonic scalpel. Next, a hole was created through the lesser omentum along the greater curve of the stomach to enter the lesser sac. The vessels along the greater omentum were  Then ligated and divided using the Harmonic scalpel moving towards the spleen and then short gastric vessels were ligated and divided in the same fashion to fully mobilize the fundus. The left crus was identified to ensure completion of the dissection. Next the antrum was measured and dissection continued inferiorly along the greater curve towards the pylorus and stopped 6cm from the pylorus. On dissecting this area, a hole in the mesocolon was created and small intestine identified.   A 40Fr ViSiGi dilator was  placed into the esophgaus and along the lesser curve of the stomach and placed on suction. 2 non-reinforced 9mm 4-60mm tristapler(s)  followed by 4 27mm 3-22mm tristaplers were used to make the resection along the antrum being sure to stay well away from the angularis by angling the jaws of the stapler towards the greater curve and later completing the resection staying along the Louise and ensuring the fundus was not retained by appropriately retracting it lateral. Air was inserted through the Murphy to perform a leak test showing no bubbles and a neutral lie of the stomach.  The assistant then went and performed an upper endoscopy and leak test. No bubbles were seen and the sleeve and antrum distended appropriately. In order to prevent internal hernia, the hole in the mesocolon was closed with a 2-0 silk in running fashion. The specimen was then placed in an endocatch bag and removed by the 66mm port. The fascia of the 18mm port was closed with a 0 vicryl by suture passer. Hemostasis was ensured. Pneumoperitoneum was evacuated, all ports were removed and all incisions closed with 4-0 monocryl suture in subcuticular fashion. Steristrips and bandaids were put in place for dressing. The patient awoke from anesthesia and was brought to pacu in stable condition. All counts were correct.  Estimated blood loss: <62ml  Findings: small hiatal hernia, hole in mesocolon closed with 2-0 silk, normal sleeve anatomy  Specimens:  Sleeve gastrectomy  Local Anesthesia: 50 ml Exparel:0.5% Marcaine mix  Post-Op Plan:       Pain Management: PO, prn      Antibiotics: Prophylactic      Anticoagulation: Prophylactic, Starting now      Post Op Studies/Consults: Not applicable      Intended Discharge: within 48h      Intended Outpatient Follow-Up: Two Week      Intended Outpatient Studies: Not Applicable      Other: Not Applicable   Arta Bruce Trinitey Roache

## 2017-05-30 NOTE — Progress Notes (Signed)
Patient with low blood pressure with post op vital signs.  Patient manual blood pressure obtained 80/40, heart rate 41, oxygen saturation 94% on 3.5 liters nasal cannula.  Dr Kieth Brightly at bedside.  Orders given.  Bedside RN aware.  Patient awakens to name answering questions appropriately. Family at bedside.  No questions at this time.

## 2017-05-31 DIAGNOSIS — R001 Bradycardia, unspecified: Secondary | ICD-10-CM

## 2017-05-31 DIAGNOSIS — N179 Acute kidney failure, unspecified: Secondary | ICD-10-CM

## 2017-05-31 LAB — CBC WITH DIFFERENTIAL/PLATELET
BASOS ABS: 0 10*3/uL (ref 0.0–0.1)
BASOS PCT: 0 %
Eosinophils Absolute: 0 10*3/uL (ref 0.0–0.7)
Eosinophils Relative: 0 %
HEMATOCRIT: 32.5 % — AB (ref 36.0–46.0)
Hemoglobin: 10.5 g/dL — ABNORMAL LOW (ref 12.0–15.0)
LYMPHS PCT: 17 %
Lymphs Abs: 2.1 10*3/uL (ref 0.7–4.0)
MCH: 29.7 pg (ref 26.0–34.0)
MCHC: 32.3 g/dL (ref 30.0–36.0)
MCV: 91.8 fL (ref 78.0–100.0)
Monocytes Absolute: 0.8 10*3/uL (ref 0.1–1.0)
Monocytes Relative: 7 %
NEUTROS ABS: 9.8 10*3/uL — AB (ref 1.7–7.7)
NEUTROS PCT: 76 %
Platelets: 244 10*3/uL (ref 150–400)
RBC: 3.54 MIL/uL — AB (ref 3.87–5.11)
RDW: 14.4 % (ref 11.5–15.5)
WBC: 12.8 10*3/uL — AB (ref 4.0–10.5)

## 2017-05-31 LAB — COMPREHENSIVE METABOLIC PANEL
ALBUMIN: 3.6 g/dL (ref 3.5–5.0)
ALT: 63 U/L — AB (ref 14–54)
AST: 44 U/L — AB (ref 15–41)
Alkaline Phosphatase: 49 U/L (ref 38–126)
Anion gap: 14 (ref 5–15)
BILIRUBIN TOTAL: 0.5 mg/dL (ref 0.3–1.2)
BUN: 44 mg/dL — AB (ref 6–20)
CHLORIDE: 105 mmol/L (ref 101–111)
CO2: 22 mmol/L (ref 22–32)
CREATININE: 2.99 mg/dL — AB (ref 0.44–1.00)
Calcium: 8.5 mg/dL — ABNORMAL LOW (ref 8.9–10.3)
GFR calc Af Amer: 21 mL/min — ABNORMAL LOW (ref >60)
GFR, EST NON AFRICAN AMERICAN: 18 mL/min — AB (ref >60)
GLUCOSE: 116 mg/dL — AB (ref 65–99)
POTASSIUM: 3.8 mmol/L (ref 3.5–5.1)
Sodium: 141 mmol/L (ref 135–145)
Total Protein: 6.7 g/dL (ref 6.5–8.1)

## 2017-05-31 NOTE — Progress Notes (Signed)
Patient refuses CPAP while here at the hospital

## 2017-05-31 NOTE — Progress Notes (Signed)
Pt has refused CPAP QHS.  RT to monitor and assess as needed.  

## 2017-05-31 NOTE — Progress Notes (Signed)
Spoke with patient at bedside. Discussed referral for medication issues, initially patient did not have any concerns. Then after talking with her she states some times she has difficulty affording meds. Encouraged her to discuss concerns with PCP, ask about alternatives, if she could come off some of her meds, patient assistance, etc. She hopes to be able to d/c some meds post op. No further HH needs identified. Will remain available as needed.

## 2017-05-31 NOTE — Progress Notes (Signed)
Progress Note: Metabolic and Bariatric Surgery Service   Chief Complaint/Subjective: Bradycardic yesterday with low blood pressures, responded to arousal and glucagon, BP now improved. Patient complains of diffuse abdominal pain especially with movement, she is tolerating water and broth  Objective: Vital signs in last 24 hours: Temp:  [97.5 F (36.4 C)-99 F (37.2 C)] 98.2 F (36.8 C) (10/31 0530) Pulse Rate:  [38-59] 50 (10/31 0530) Resp:  [0-20] 17 (10/31 0530) BP: (80-148)/(40-94) 144/56 (10/31 0530) SpO2:  [94 %-100 %] 100 % (10/31 0530) Last BM Date: 05/30/17  Intake/Output from previous day: 10/30 0701 - 10/31 0700 In: 4259.3 [P.O.:390; I.V.:3756.3; IV Piggyback:113] Out: 325 [Urine:300; Blood:25] Intake/Output this shift: No intake/output data recorded.  Lungs: CTAB  Cardiovascular: bradycardic  Abd: soft, ATTP, incisions c/d/i  Extremities: no edema  Neuro: AOx4  Lab Results: CBC   Recent Labs  05/30/17 0957 05/31/17 0618  WBC 9.7 12.8*  HGB 11.6*  11.6* 10.5*  HCT 36.4  36.0 32.5*  PLT 275 244   BMET  Recent Labs  05/30/17 0957 05/31/17 0618  NA  --  141  K  --  3.8  CL  --  105  CO2  --  22  GLUCOSE  --  116*  BUN  --  44*  CREATININE 2.10* 2.99*  CALCIUM  --  8.5*   PT/INR No results for input(s): LABPROT, INR in the last 72 hours. ABG No results for input(s): PHART, HCO3 in the last 72 hours.  Invalid input(s): PCO2, PO2  Studies/Results:  Anti-infectives: Anti-infectives    Start     Dose/Rate Route Frequency Ordered Stop   05/30/17 0628  clindamycin (CLEOCIN) 900 MG/50ML IVPB    Comments:  Carleene Cooper   : cabinet override      05/30/17 0628 05/30/17 0734   05/30/17 0600  gentamicin (GARAMYCIN) 1.5 mg/kg, clindamycin (CLEOCIN) 900 mg in dextrose 5 % 100 mL IVPB  Status:  Discontinued     212 mL/hr over 30 Minutes Intravenous On call to O.R. 05/29/17 1555 05/29/17 1559   05/30/17 0600  gentamicin (GARAMYCIN) 520 mg  in dextrose 5 % 100 mL IVPB     5 mg/kg  104 kg (Adjusted) 113 mL/hr over 60 Minutes Intravenous 30 min pre-op 05/29/17 1601 05/30/17 0833   05/30/17 0600  clindamycin (CLEOCIN) IVPB 900 mg     900 mg 100 mL/hr over 30 Minutes Intravenous 30 min pre-op 05/29/17 1601 05/30/17 0754      Medications: Scheduled Meds: . cloNIDine  0.2 mg Oral BID  . enoxaparin (LOVENOX) injection  30 mg Subcutaneous Q12H  . Influenza vac split quadrivalent PF  0.5 mL Intramuscular Tomorrow-1000  . levothyroxine  75 mcg Oral QAC breakfast  . metoprolol tartrate  100 mg Oral BID  . pantoprazole (PROTONIX) IV  40 mg Intravenous QHS  . protein supplement shake  2 oz Oral Q2H  . topiramate  50 mg Oral BID   Continuous Infusions: . sodium chloride 1,000 mL (05/30/17 2210)   PRN Meds:.acetaminophen (TYLENOL) oral liquid 160 mg/5 mL, albuterol, hydrALAZINE, morphine injection, ondansetron (ZOFRAN) IV, oxyCODONE, simethicone, traMADol  Assessment/Plan: Patient Active Problem List   Diagnosis Date Noted  . Acute kidney injury ([place]) 05/31/2017  . Bradycardia 05/31/2017  . Morbid obesity ([place] [place]) 05/30/2017  . Right ankle pain 01/18/2012   s/p Procedure(s): LAPAROSCOPIC GASTRIC SLEEVE RESECTION WITH UPPPER ENDO AND HIATAL HERNIA REPAIR 05/30/2017 -continue bariatric diet -pain control -due to bradycardia and creatinine elevation she will stay  here for monitoring and fluids and recheck labs in am  Bradycardia, currently in 50s -BP slightly elevated and mentation normal, continue to monitor  Cr increased from baseline, UOP borderline low -continue fluids  Disposition:  LOS: 1 day  The patient will be in the hospital for normal postop protocol  Mickeal Skinner, MD [phone] [facility]

## 2017-05-31 NOTE — Progress Notes (Signed)
Patient alert and oriented, Post op day 1.  Provided support and encouragement.  Encouraged pulmonary toilet, ambulation and small sips of liquids.  Patient completed 12 ounces of clear fluids, started protein shakes.  All questions answered.  Will continue to monitor.

## 2017-06-01 LAB — CBC WITH DIFFERENTIAL/PLATELET
BASOS PCT: 0 %
Basophils Absolute: 0 10*3/uL (ref 0.0–0.1)
EOS ABS: 0 10*3/uL (ref 0.0–0.7)
Eosinophils Relative: 0 %
HCT: 26.4 % — ABNORMAL LOW (ref 36.0–46.0)
Hemoglobin: 8.5 g/dL — ABNORMAL LOW (ref 12.0–15.0)
Lymphocytes Relative: 33 %
Lymphs Abs: 2.8 10*3/uL (ref 0.7–4.0)
MCH: 29.8 pg (ref 26.0–34.0)
MCHC: 32.2 g/dL (ref 30.0–36.0)
MCV: 92.6 fL (ref 78.0–100.0)
MONO ABS: 0.4 10*3/uL (ref 0.1–1.0)
MONOS PCT: 4 %
NEUTROS PCT: 63 %
Neutro Abs: 5.4 10*3/uL (ref 1.7–7.7)
Platelets: 208 10*3/uL (ref 150–400)
RBC: 2.85 MIL/uL — ABNORMAL LOW (ref 3.87–5.11)
RDW: 14.6 % (ref 11.5–15.5)
WBC: 8.6 10*3/uL (ref 4.0–10.5)

## 2017-06-01 LAB — BASIC METABOLIC PANEL
Anion gap: 7 (ref 5–15)
BUN: 40 mg/dL — ABNORMAL HIGH (ref 6–20)
CALCIUM: 8.3 mg/dL — AB (ref 8.9–10.3)
CO2: 25 mmol/L (ref 22–32)
CREATININE: 2.79 mg/dL — AB (ref 0.44–1.00)
Chloride: 111 mmol/L (ref 101–111)
GFR, EST AFRICAN AMERICAN: 23 mL/min — AB (ref >60)
GFR, EST NON AFRICAN AMERICAN: 20 mL/min — AB (ref >60)
Glucose, Bld: 104 mg/dL — ABNORMAL HIGH (ref 65–99)
Potassium: 4 mmol/L (ref 3.5–5.1)
SODIUM: 143 mmol/L (ref 135–145)

## 2017-06-01 LAB — HEMOGLOBIN AND HEMATOCRIT, BLOOD
HEMATOCRIT: 26 % — AB (ref 36.0–46.0)
HEMOGLOBIN: 8.1 g/dL — AB (ref 12.0–15.0)

## 2017-06-01 MED ORDER — ONDANSETRON 4 MG PO TBDP
4.0000 mg | ORAL_TABLET | Freq: Three times a day (TID) | ORAL | 0 refills | Status: DC | PRN
Start: 2017-06-01 — End: 2017-12-11

## 2017-06-01 MED ORDER — PANTOPRAZOLE SODIUM 40 MG PO TBEC: 40.0000 mg | DELAYED_RELEASE_TABLET | Freq: Every day | ORAL | 0 refills | Status: DC

## 2017-06-01 MED ORDER — OXYCODONE HCL 5 MG/5ML PO SOLN: 5.0000 mg | ORAL | 0 refills | Status: DC | PRN

## 2017-06-01 NOTE — Progress Notes (Signed)
Patient alert and oriented, Post op day 2.  Provided support and encouragement.  Encouraged pulmonary toilet, ambulation and small sips of liquids.  All questions answered.  Will continue to monitor. 

## 2017-06-01 NOTE — Progress Notes (Signed)
Patient alert and oriented, pain is controlled. Patient is tolerating fluids, advanced to protein shake today, patient is tolerating well.  Reviewed Gastric sleeve discharge instructions with patient and patient is able to articulate understanding.  Provided information on BELT program, Support Group and [facility] outpatient pharmacy. All questions answered, will continue to monitor.  

## 2017-06-01 NOTE — Progress Notes (Signed)
Pt has declined use of CPAP while here at hospital.  Rt to monitor and assess as needed.

## 2017-06-02 LAB — BASIC METABOLIC PANEL
ANION GAP: 7 (ref 5–15)
BUN: 34 mg/dL — AB (ref 6–20)
CALCIUM: 8.4 mg/dL — AB (ref 8.9–10.3)
CO2: 23 mmol/L (ref 22–32)
Chloride: 113 mmol/L — ABNORMAL HIGH (ref 101–111)
Creatinine, Ser: 2.32 mg/dL — ABNORMAL HIGH (ref 0.44–1.00)
GFR calc Af Amer: 29 mL/min — ABNORMAL LOW (ref >60)
GFR calc non Af Amer: 25 mL/min — ABNORMAL LOW (ref >60)
GLUCOSE: 96 mg/dL (ref 65–99)
Potassium: 3.6 mmol/L (ref 3.5–5.1)
Sodium: 143 mmol/L (ref 135–145)

## 2017-06-02 LAB — CBC
HEMATOCRIT: 25.4 % — AB (ref 36.0–46.0)
Hemoglobin: 8.1 g/dL — ABNORMAL LOW (ref 12.0–15.0)
MCH: 29.7 pg (ref 26.0–34.0)
MCHC: 31.9 g/dL (ref 30.0–36.0)
MCV: 93 fL (ref 78.0–100.0)
Platelets: 199 10*3/uL (ref 150–400)
RBC: 2.73 MIL/uL — ABNORMAL LOW (ref 3.87–5.11)
RDW: 14.7 % (ref 11.5–15.5)
WBC: 8 10*3/uL (ref 4.0–10.5)

## 2017-06-02 MED ORDER — DOCUSATE SODIUM 100 MG PO CAPS: 100.0000 mg | ORAL_CAPSULE | Freq: Two times a day (BID) | ORAL | 0 refills | Status: AC

## 2017-06-02 MED ORDER — FERROUS SULFATE 300 (60 FE) MG/5ML PO SYRP: 300.0000 mg | ORAL_SOLUTION | Freq: Two times a day (BID) | ORAL | 3 refills | Status: DC

## 2017-06-02 NOTE — Progress Notes (Signed)
Pt alert, oriented, ambulating, and tolerating diet. D/C instructions and prescription was given. All questions were answered.  Pt was d/cd home.

## 2017-06-02 NOTE — Progress Notes (Signed)
Provided chicken unjury several times during night and strongly encouraged pt to sip unjury slowly but pt declined and draink mostly water.  Encouraged pt, also, to use IS frequently when awake during night w/ increased temp.  Pt walked  in hallway early during night and up to BR occasionally.

## 2017-06-02 NOTE — Discharge Summary (Signed)
Physician Discharge Summary  Dawn Thomas IZT:[id number] DOB: 23-Jun-1976 DOA: 05/30/2017  PCP: Virginia Rochester, PA  Admit date: 05/30/2017 Discharge date: 06/02/2017  Recommendations for Outpatient Follow-up:  1.  (include homehealth, outpatient follow-up instructions, specific recommendations for PCP to follow-up on, etc.)  Follow-up Information    Tameya Kuznia, Arta Bruce, MD. Go on 06/16/2017.   Specialty:  General Surgery Why:  at [place] information: [place] [address] [phone]        Tresa Jolley, Arta Bruce, MD Follow up.   Specialty:  General Surgery Contact information: [place] [address] [phone]          Discharge Diagnoses:  Active Problems:   Morbid obesity ([place])   Acute kidney injury ([place])   Bradycardia   Surgical Procedure: Laparoscopic Sleeve Gastrectomy, upper endoscopy  Discharge Condition: Good Disposition: Home  Diet recommendation: Postoperative sleeve gastrectomy diet (liquids only)  Filed Weights   05/30/17 0535  Weight: (!) 165.8 kg (365 lb 8 oz)     Hospital Course:  The patient was admitted after undergoing laparoscopic sleeve gastrectomy. POD 0 she ambulated well. POD 1 she was started on the water diet protocol and tolerated 300 ml in the first shift. Once meeting the water amount she was advanced to bariatric protein shakes which they tolerated. POD 1 she had a creatinine elevation. POD 2 this improved but she had a hemoglobin decrease requiring recheck and later in the day of fever of 101 so she was kept until POD 3 when her labs continued to improve and her fever had dissipated and were discharged home POD 3.  Treatments: surgery: laparoscopic sleeve gastrectomy, hydration, recheck labs for ABLA  Discharge Instructions  Discharge Instructions    Ambulate hourly while awake    Complete by:  As directed    Call MD for:  difficulty breathing, headache or visual  disturbances    Complete by:  As directed    Call MD for:  persistant dizziness or light-headedness    Complete by:  As directed    Call MD for:  persistant nausea and vomiting    Complete by:  As directed    Call MD for:  redness, tenderness, or signs of infection (pain, swelling, redness, odor or green/yellow discharge around incision site)    Complete by:  As directed    Call MD for:  severe uncontrolled pain    Complete by:  As directed    Call MD for:  temperature >101 F    Complete by:  As directed    Diet bariatric full liquid    Complete by:  As directed    Discharge wound care:    Complete by:  As directed    Remove Bandaids tomorrow, ok to shower tomorrow. Steristrips may fall off in 1-3 weeks.   Incentive spirometry    Complete by:  As directed    Perform hourly while awake     Allergies as of 06/02/2017      Reactions   Maxalt [rizatriptan Benzoate] Shortness Of Breath   Nsaids Other (See Comments)   Renal insufficiency.   Omeprazole Hives   Adhesive [tape] Other (See Comments)   Burns skin   Hydrocodone Hives   Penicillins Hives   Zithromax [azithromycin] Other (See Comments)   "messes with my breathing"      Medication List    STOP taking these medications   furosemide 20 MG tablet Commonly known as:  LASIX   omeprazole 20 MG capsule Commonly known as:  PRILOSEC   sucralfate 1 GM/10ML suspension Commonly known as:  CARAFATE     TAKE these medications   albuterol 108 (90 Base) MCG/ACT inhaler Commonly known as:  PROVENTIL HFA;VENTOLIN HFA Inhale 2 puffs into the lungs every 6 (six) hours as needed for wheezing or shortness of breath.   allopurinol 300 MG tablet Commonly known as:  ZYLOPRIM Take 300 mg by mouth daily.   cetirizine 10 MG tablet Commonly known as:  ZYRTEC ALLERGY Take 1 tablet (10 mg total) by mouth daily.   cloNIDine 0.2 MG tablet Commonly known as:  CATAPRES Take 0.2 mg by mouth 2 (two) times daily. Notes to patient:   Monitor Blood Pressure Daily and keep a log for primary care physician.  You may need to make changes to your medications with rapid weight loss.     docusate sodium 100 MG capsule Commonly known as:  COLACE Take 1 capsule (100 mg total) by mouth every 12 (twelve) hours.   ergocalciferol 50000 units capsule Commonly known as:  VITAMIN D2 Take 50,000 Units by mouth every Monday.   ferrous sulfate 300 (60 Fe) MG/5ML syrup Take 5 mLs (300 mg total) by mouth 2 (two) times daily with a meal.   levothyroxine 75 MCG tablet Commonly known as:  SYNTHROID, LEVOTHROID Take 75 mcg by mouth daily.   losartan-hydrochlorothiazide 100-25 MG tablet Commonly known as:  HYZAAR Take 1 tablet by mouth daily.   metoprolol tartrate 100 MG tablet Commonly known as:  LOPRESSOR Take 100 mg by mouth 2 (two) times daily. Notes to patient:  Monitor Blood Pressure Daily and keep a log for primary care physician.  You may need to make changes to your medications with rapid weight loss.     ondansetron 4 MG disintegrating tablet Commonly known as:  ZOFRAN ODT Take 1 tablet (4 mg total) by mouth every 8 (eight) hours as needed for nausea or vomiting.   oxyCODONE 5 MG/5ML solution Commonly known as:  ROXICODONE Take 5 mLs (5 mg total) by mouth every 4 (four) hours as needed for severe pain (moderate pain not relieved by Ultram).   pantoprazole 40 MG tablet Commonly known as:  PROTONIX Take 1 tablet (40 mg total) by mouth daily.   topiramate 50 MG tablet Commonly known as:  TOPAMAX Take 50 mg by mouth 2 (two) times daily.   traMADol 50 MG tablet Commonly known as:  ULTRAM Take 1 tablet (50 mg total) by mouth every 6 (six) hours as needed. What changed:  reasons to take this            Discharge Care Instructions        Start     Ordered   06/01/17 0000  Discharge wound care:    Comments:  Remove Bandaids tomorrow, ok to shower tomorrow. Steristrips may fall off in 1-3 weeks.   06/01/17 1043      Follow-up Information    Sahaj Bona, Arta Bruce, MD. Go on 06/16/2017.   Specialty:  General Surgery Why:  at [place] information: [place] [address] [phone]        Georga Stys, Arta Bruce, MD Follow up.   Specialty:  General Surgery Contact information: [place] [address] [phone]            The results of significant diagnostics from this hospitalization (including imaging, microbiology, ancillary and laboratory) are  listed below for reference.    Significant Diagnostic Studies: No results found.  Labs: Basic Metabolic Panel:  Recent Labs Lab 05/30/17 0957 05/31/17 0618 06/01/17 0506 06/02/17 0527  NA  --  141 143 143  K  --  3.8 4.0 3.6  CL  --  105 111 113*  CO2  --  22 25 23   GLUCOSE  --  116* 104* 96  BUN  --  44* 40* 34*  CREATININE 2.10* 2.99* 2.79* 2.32*  CALCIUM  --  8.5* 8.3* 8.4*   Liver Function Tests:  Recent Labs Lab 05/31/17 0618  AST 44*  ALT 63*  ALKPHOS 49  BILITOT 0.5  PROT 6.7  ALBUMIN 3.6    CBC:  Recent Labs Lab 05/30/17 0957 05/31/17 0618 06/01/17 0506 06/01/17 1343 06/02/17 0527  WBC 9.7 12.8* 8.6  --  8.0  NEUTROABS  --  9.8* 5.4  --   --   HGB 11.6*  11.6* 10.5* 8.5* 8.1* 8.1*  HCT 36.4  36.0 32.5* 26.4* 26.0* 25.4*  MCV 90.8 91.8 92.6  --  93.0  PLT 275 244 208  --  199    CBG: No results for input(s): GLUCAP in the last 168 hours.  Active Problems:   Morbid obesity ([place])   Acute kidney injury ([place])   Bradycardia   Time coordinating discharge: 52min

## 2017-06-05 ENCOUNTER — Telehealth (HOSPITAL_COMMUNITY): Payer: Self-pay

## 2017-06-05 NOTE — Telephone Encounter (Signed)
Follow up with bariatric surgical patient to discuss post discharge questions.    1.  Are you having any pain not relieved by pain medication?no, no pain requiring medication  2.  How much fluid total fluid intake have you had in the last 24/48 hours?  36 ounces of fluid  3.  How much protein intake have you had in the last 24/48 hours?40 grams of protein  4.  Have you had any trouble making urine?urinating mulitple times throughout day, to see nephrologist tomorrow  5.  Have you had nausea that has not been relieved by nausea medication?no  6.  Are you ambulating every hour?yes  7.  Are you passing gas or had a BM?no, bm instructed to try miralax or mom  8.  Do you know how to contact [facility]? [facility]? [facility]?yes  9.  Are you taking your vitamins and calcium without difficulty?she has been taking otc vitamins,  She plans to buy bariatric vitamins this week.  Has not picked up iron script that [facility] wrote at dc  10. Tell me how your incision looks?  Any redness, open incision, or drainage?look good

## 2017-06-07 ENCOUNTER — Telehealth (HOSPITAL_COMMUNITY): Payer: Self-pay

## 2017-06-07 NOTE — Telephone Encounter (Signed)
Follow up with patient regarding her protein/fluid intake.  Patient stated she can no longer tolerate her protein at all without n/v.  She has been taking nausea medication.  She has only gotten in 20 ounces of fluid yesterday/  Jeremie saw PCP today, concerns around rash on abdomen especially in umbilicus and one incision.  PCP recommended she call surgeon's office.  We discussed in detail the need to increase fluids and strategies to accomplish fluid increase including available fluid options.  Will continue to follow.

## 2017-06-08 ENCOUNTER — Telehealth (HOSPITAL_COMMUNITY): Payer: Self-pay

## 2017-06-08 NOTE — Telephone Encounter (Signed)
Talked with patient about fluid intake.  She reports she has increased her fluid to 50 ounces.  She stated she has found a protein she can tolerate.  Her protein intake is around 25 grams.  We discussed that 20 ounces of fluids is not enough and the importance of continued to drink fluids. She states understanding. We did discuss that diet will not change until her next [facility] appointment.  Date and time provided to patient.

## 2017-06-13 ENCOUNTER — Encounter: Payer: Managed Care, Other (non HMO) | Attending: General Surgery | Admitting: Registered"

## 2017-06-13 DIAGNOSIS — Z6841 Body Mass Index (BMI) 40.0 and over, adult: Secondary | ICD-10-CM | POA: Diagnosis not present

## 2017-06-13 DIAGNOSIS — E669 Obesity, unspecified: Secondary | ICD-10-CM

## 2017-06-13 DIAGNOSIS — Z713 Dietary counseling and surveillance: Secondary | ICD-10-CM | POA: Insufficient documentation

## 2017-06-14 ENCOUNTER — Telehealth (HOSPITAL_COMMUNITY): Payer: Self-pay

## 2017-06-14 NOTE — Telephone Encounter (Signed)
Made discharge phone call to patient asking the following questions.    1. Do you have someone to care for you now that you are home?  Yes, my sister is with me all the time  2. Are you having pain now that is not relieved by your pain medication? Yes, my diet was advanced.  I ate fish last pm and Im having abdominal pain. Patient also stated that she has not had a bm since 10/30.  3. Are you able to drink the recommended daily amount of fluids (48 ounces minimum/day) and protein (60-80 grams/day) as prescribed by the dietitian or nutritional counselor?  yes 4. Are you taking the vitamins and minerals as prescribed?  yes 5. Do you have the "on call" number to contact your surgeon if you have a problem or question?  Yes 6. Are your incisions free of redness, swelling or drainage? (If steri strips, address that these can fall off, shower as tolerated) yes 7. Have your bowels moved since your surgery?  If not, are you passing gas?  No bm since 10/30, could be the source of the abdominal pain.  OTC medications for constipation reviewed with the patient.   8. Are you up and walking 3-4 times per day?  yes 9. Were you provided your discharge medications before your surgery or before you were discharged from the hospital and are you taking them without problem?  Yes   Will follow up with patient regarding pain and BM's in the am.

## 2017-06-15 NOTE — Progress Notes (Signed)
Bariatric Class:  Appt start time: 1530 end time:  1630.  2 Week Post-Operative Nutrition Class  Patient was seen on 06/13/2017 for Post-Operative Nutrition education at [facility].   Surgery date: 05/30/2017 Surgery type: Sleeve gastrectomy Start weight at [facility]: 400.9 Weight today: 350.9 Weight change: 50 lbs loss  Pt states she drinks diet cranberry/grape, diet orangeade, protein shakes, and water. Pt reports she has experienced some dizziness/lightheadedness, and some nausea. Pt states she has had some weakness and was told she may have vertigo.   TANITA  BODY COMP RESULTS  06/13/2017   BMI (kg/m^2) N/A   Fat Mass (lbs)    Fat Free Mass (lbs)    Total Body Water (lbs)    The following the learning objectives were met by the patient during this course:  Identifies Phase 3A (Soft, High Proteins) Dietary Goals and will begin from 2 weeks post-operatively to 2 months post-operatively  Identifies appropriate sources of fluids and proteins   States protein recommendations and appropriate sources post-operatively  Identifies the need for appropriate texture modifications, mastication, and bite sizes when consuming solids  Identifies appropriate multivitamin and calcium sources post-operatively  Describes the need for physical activity post-operatively and will follow MD recommendations  States when to call healthcare provider regarding medication questions or post-operative complications  Handouts given during class include:  Phase 3A: Soft, High Protein Diet Handout  Follow-Up Plan: Patient will follow-up at [facility] in 6 weeks for 2 month post-op nutrition visit for diet advancement per MD.

## 2017-06-16 ENCOUNTER — Ambulatory Visit: Payer: Self-pay | Admitting: General Surgery

## 2017-07-04 ENCOUNTER — Ambulatory Visit (INDEPENDENT_AMBULATORY_CARE_PROVIDER_SITE_OTHER): Payer: 59 | Admitting: Psychology

## 2017-07-04 DIAGNOSIS — F509 Eating disorder, unspecified: Secondary | ICD-10-CM

## 2017-07-04 DIAGNOSIS — F4322 Adjustment disorder with anxiety: Secondary | ICD-10-CM

## 2017-07-19 ENCOUNTER — Encounter: Payer: Managed Care, Other (non HMO) | Attending: General Surgery | Admitting: Registered"

## 2017-07-19 ENCOUNTER — Encounter: Payer: Self-pay | Admitting: Registered"

## 2017-07-19 DIAGNOSIS — Z6841 Body Mass Index (BMI) 40.0 and over, adult: Secondary | ICD-10-CM | POA: Insufficient documentation

## 2017-07-19 DIAGNOSIS — Z713 Dietary counseling and surveillance: Secondary | ICD-10-CM | POA: Insufficient documentation

## 2017-07-19 DIAGNOSIS — E669 Obesity, unspecified: Secondary | ICD-10-CM

## 2017-07-19 NOTE — Patient Instructions (Signed)
Goals:  Follow Phase 3B: High Protein + Non-Starchy Vegetables  Eat 3-6 small meals/snacks, every 3-5 hrs  Increase lean protein foods to meet 60g goal  Increase fluid intake to 64oz +  Avoid drinking 15 minutes before, during and 30 minutes after eating  Aim for >30 min of physical activity daily  

## 2017-07-19 NOTE — Progress Notes (Signed)
Follow-up visit:  8 Weeks Post-Operative Sleeve gastrectomy Surgery  Medical Nutrition Therapy:  Appt start time: 3:40 end time:  4:20.  Primary concerns today: Post-operative Bariatric Surgery Nutrition Management.  Non scale victories: blood pressure has improved, from size 30-26  Surgery date: 05/30/2017 Surgery type: Sleeve gastrectomy Start weight at [facility]: 400.9 lbs Weight today: 333.8 lbs Weight change: 17.1 lbs from 350.9 (06/13/2017) Total weight lost: 67.1 lbs Weight loss goal: ~250 lbs, reduce medications   TANITA  BODY COMP RESULTS  06/13/2017 07/19/2017   BMI (kg/m^2) N/A 53.9   Fat Mass (lbs)  175.2   Fat Free Mass (lbs)  158.6   Total Body Water (lbs)  119.0    Pt arrives with female friend and states she cannot tolerate protein water anymore. Pt states she is drinking  [employer] lean protein shake. Pt states it has more than 5g of carbohydrate but was approved to drink this due to it being the only protein shake option she can handle. Pt states yesterday wasn't a good day with eating because she was busy making a cake for someone and wanted to get it done on time; skipped breakfast and lunch, only ate dinner. Pt states she had broccoli and cheese for the first time; itwent well. Pt states she was starving when she ate dinner and thinks she ate it too fast leading to vomiting. Pt states she didn't eat much today due to fear and wanting to take it easy on her stomach. Pt states she was not able to keep down Celebrate; can tolerate ProCare chewable; would rather take the capsule. Pt states she has not been taking multivitamins for 3 weeks but plans to order [facility] tomorrow when she gets paid. Pt states she is taking Miralax daily, reports she was told she needed to add more fiber to her diet. Pt has a hard time recalling typical diet.    Preferred Learning Style:   No preference indicated   Learning Readiness:   Contemplating  Ready  Change in progress  24-hr  recall: B (AM): skips; [employer]-1/4 burrito bowl (beans, chicken, cheese, 21g) Snk (AM): none  L (PM): sometimes skips; [employer]-2 grilled chicken nuggets (6g) Snk (PM): none  D (PM): broccoli and cheese (3g), 3 oz grilled lean pork chops (21 g) Snk (PM): none  Fluid intake: water (60-70 oz), peppermint tea, Nature's Twist, Capri Sun; 64+ oz Estimated total protein intake: ~51 grams  Medications: See list Supplementation: + 2 TUMS   Using straws: no Drinking while eating: no Having you been chewing well: most times Chewing/swallowing difficulties: no Changes in vision: no Changes to mood/headaches: no Hair loss/Changes to skin/Changes to nails: no, no, no Any difficulty focusing or concentrating: no Sweating: no Dizziness/Lightheaded: no Palpitations: no  Carbonated beverages: no N/V/D/C/GAS: sometimes, no, no, yes-Miralax, no Abdominal Pain: no Dumping syndrome: no Last Lap-Band fill: N/A  Recent physical activity:  Walking 60 min, 3-4 days/week  Progress Towards Goal(s):  In progress.  Handouts given during visit include:  Phase 3B: High protein + NS vegetables   Nutritional Diagnosis:  [place]-3.3 Overweight/obesity related to past poor dietary habits and physical inactivity as evidenced by patient w/ recent sleeve gastrectomy surgery following dietary guidelines for continued weight loss.     Intervention:  Nutrition education and counseling. Goals:  Follow Phase 3B: High Protein + Non-Starchy Vegetables  Eat 3-6 small meals/snacks, every 3-5 hrs  Increase lean protein foods to meet 60g goal  Increase fluid intake to 64oz +  Avoid drinking 15 minutes before, during and 30 minutes after eating  Aim for >30 min of physical activity daily  Teaching Method Utilized:  Visual Auditory Hands on  Barriers to learning/adherence to lifestyle change: contemplative stage of change  Demonstrated degree of understanding via:  Teach Back   Monitoring/Evaluation:   Dietary intake, exercise, lap band fills, and body weight. Follow up in 3 months for 5 month post-op visit.

## 2017-08-03 ENCOUNTER — Ambulatory Visit: Payer: Self-pay | Admitting: Psychology

## 2017-08-04 ENCOUNTER — Other Ambulatory Visit: Payer: Self-pay | Admitting: General Surgery

## 2017-08-04 DIAGNOSIS — Z9884 Bariatric surgery status: Secondary | ICD-10-CM

## 2017-08-08 ENCOUNTER — Ambulatory Visit
Admission: RE | Admit: 2017-08-08 | Discharge: 2017-08-08 | Disposition: A | Discharge status: Home / Self Care | Payer: Managed Care, Other (non HMO) | Source: Ambulatory Visit | Attending: General Surgery | Admitting: General Surgery

## 2017-08-08 ENCOUNTER — Other Ambulatory Visit: Payer: Self-pay

## 2017-08-08 DIAGNOSIS — Z9884 Bariatric surgery status: Secondary | ICD-10-CM

## 2017-08-10 ENCOUNTER — Ambulatory Visit: Payer: Self-pay | Admitting: Psychology

## 2017-09-08 ENCOUNTER — Ambulatory Visit: Payer: 59 | Admitting: Psychology

## 2017-09-19 ENCOUNTER — Other Ambulatory Visit: Payer: Self-pay

## 2017-09-19 ENCOUNTER — Emergency Department (HOSPITAL_BASED_OUTPATIENT_CLINIC_OR_DEPARTMENT_OTHER)
Admission: EM | Admit: 2017-09-19 | Discharge: 2017-09-19 | Disposition: A | Discharge status: Home / Self Care | Payer: Self-pay | Attending: Emergency Medicine | Admitting: Emergency Medicine

## 2017-09-19 ENCOUNTER — Encounter (HOSPITAL_BASED_OUTPATIENT_CLINIC_OR_DEPARTMENT_OTHER): Payer: Self-pay | Admitting: Emergency Medicine

## 2017-09-19 DIAGNOSIS — I129 Hypertensive chronic kidney disease with stage 1 through stage 4 chronic kidney disease, or unspecified chronic kidney disease: Secondary | ICD-10-CM | POA: Insufficient documentation

## 2017-09-19 DIAGNOSIS — J45909 Unspecified asthma, uncomplicated: Secondary | ICD-10-CM | POA: Insufficient documentation

## 2017-09-19 DIAGNOSIS — I10 Essential (primary) hypertension: Secondary | ICD-10-CM

## 2017-09-19 DIAGNOSIS — N183 Chronic kidney disease, stage 3 (moderate): Secondary | ICD-10-CM | POA: Insufficient documentation

## 2017-09-19 DIAGNOSIS — J111 Influenza due to unidentified influenza virus with other respiratory manifestations: Secondary | ICD-10-CM | POA: Insufficient documentation

## 2017-09-19 DIAGNOSIS — R69 Illness, unspecified: Secondary | ICD-10-CM

## 2017-09-19 DIAGNOSIS — Z79899 Other long term (current) drug therapy: Secondary | ICD-10-CM | POA: Insufficient documentation

## 2017-09-19 DIAGNOSIS — E039 Hypothyroidism, unspecified: Secondary | ICD-10-CM | POA: Insufficient documentation

## 2017-09-19 MED ORDER — ACETAMINOPHEN 500 MG PO TABS
1000.0000 mg | ORAL_TABLET | Freq: Once | ORAL | Status: AC
  Administered 2017-09-19: 1000 mg via ORAL
  Filled 2017-09-19: qty 2

## 2017-09-19 MED ORDER — CLONIDINE HCL 0.1 MG PO TABS
0.2000 mg | ORAL_TABLET | Freq: Once | ORAL | Status: AC
  Administered 2017-09-19: 0.2 mg via ORAL
  Filled 2017-09-19: qty 2

## 2017-09-19 MED ORDER — METOPROLOL TARTRATE 50 MG PO TABS
100.0000 mg | ORAL_TABLET | Freq: Once | ORAL | Status: AC
  Administered 2017-09-19: 100 mg via ORAL
  Filled 2017-09-19: qty 2

## 2017-09-19 MED ORDER — OSELTAMIVIR PHOSPHATE 75 MG PO CAPS: 75.0000 mg | ORAL_CAPSULE | Freq: Two times a day (BID) | ORAL | 0 refills | Status: DC

## 2017-09-19 NOTE — ED Triage Notes (Signed)
Pt sts sxs started with a "smoker's cough" yesterday and today she woke with body aches

## 2017-09-19 NOTE — Discharge Instructions (Signed)
It was our pleasure to provide your ER care today - we hope that you feel better.  Take tamiflu as prescribed.  Take acetaminophen and/or ibuprofen as need for fever and body aches.   You may also try an over the counter cold/flu medication as need for symptom relief.  Rest. Drink adequate fluids.  Follow up with primary care doctor in 1 week if symptoms fail to improve/resolve.  Your blood pressure is high today - continue your blood pressure medication, and follow up with your doctor for recheck in the coming week.   Return to ER if worse, increased trouble breathing, other concern.

## 2017-09-19 NOTE — ED Provider Notes (Signed)
[facility] EMERGENCY DEPARTMENT Provider Note   CSN: [id number] Arrival date & time: 09/19/17  6270     History   Chief Complaint Chief Complaint  Patient presents with  . Cough  . Generalized Body Aches    HPI Dawn Thomas is a 42 y.o. female.  Patient c/o non prod cough, congestion, scratchy throat, subjective fever, and diffuse body aches since yesterday. Symptoms moderate, persistent. Recent ill contacts w others w uri/flu symptoms. Denies chest pain. No sob. No severe headaches. No neck stiffness/rigidity.  States hx htn, did not take her bp med yesterday or this AM (although has adequate at home). No increased leg swelling, no orthopnea. No severe headaches.    The history is provided by the patient.  Cough  Associated symptoms include rhinorrhea and myalgias. Pertinent negatives include no chest pain, no shortness of breath and no eye redness.    Past Medical History:  Diagnosis Date  . Asthma   . Complication of anesthesia    woke up during surgery   . Hypertension   . Hypothyroidism   . Migraines   . Renal disorder    stage 3- Dr [facility] - Dr Olivia Mackie  . Renal insufficiency   . Sleep apnea    mild   . Tendonitis   . Thyroid disease     Patient Active Problem List   Diagnosis Date Noted  . Acute kidney injury ([place]) 05/31/2017  . Bradycardia 05/31/2017  . Morbid obesity ([place]) 05/30/2017  . Right ankle pain 01/18/2012    Past Surgical History:  Procedure Laterality Date  . CESAREAN SECTION    . CHOLECYSTECTOMY    . DILATION AND CURETTAGE OF UTERUS    . LAPAROSCOPIC GASTRIC SLEEVE RESECTION N/A 05/30/2017   Procedure: LAPAROSCOPIC GASTRIC SLEEVE RESECTION WITH UPPPER ENDO AND HIATAL HERNIA REPAIR;  Surgeon: Kieth Brightly, Arta Bruce, MD;  Location: [facility] ORS;  Service: General;  Laterality: N/A;    OB History    No data available       Home Medications    Prior to Admission medications   Medication Sig Start Date End Date Taking?  Authorizing Provider  albuterol (PROVENTIL HFA;VENTOLIN HFA) 108 (90 BASE) MCG/ACT inhaler Inhale 2 puffs into the lungs every 6 (six) hours as needed for wheezing or shortness of breath.     [provider]  allopurinol (ZYLOPRIM) 300 MG tablet Take 300 mg by mouth daily.     [provider]  cetirizine (ZYRTEC ALLERGY) 10 MG tablet Take 1 tablet (10 mg total) by mouth daily. Patient not taking: Reported on 05/23/2017 10/14/15   Waynetta Pean, PA-C  cloNIDine (CATAPRES) 0.2 MG tablet Take 0.2 mg by mouth 2 (two) times daily.    [provider]  ergocalciferol (VITAMIN D2) 50000 units capsule Take 50,000 Units by mouth every Monday.    [provider]  ferrous sulfate 300 (60 Fe) MG/5ML syrup Take 5 mLs (300 mg total) by mouth 2 (two) times daily with a meal. 06/02/17   Kinsinger, Arta Bruce, MD  levothyroxine (SYNTHROID, LEVOTHROID) 75 MCG tablet Take 75 mcg by mouth daily.     [provider]  losartan-hydrochlorothiazide (HYZAAR) 100-25 MG tablet Take 1 tablet by mouth daily.      [provider]  metoprolol tartrate (LOPRESSOR) 100 MG tablet Take 100 mg by mouth 2 (two) times daily. 05/17/17   [provider]  ondansetron (ZOFRAN ODT) 4 MG disintegrating tablet Take 1 tablet (4 mg total) by mouth  every 8 (eight) hours as needed for nausea or vomiting. 06/01/17   Kinsinger, Arta Bruce, MD  oxyCODONE (ROXICODONE) 5 MG/5ML solution Take 5 mLs (5 mg total) by mouth every 4 (four) hours as needed for severe pain (moderate pain not relieved by Ultram). 06/01/17   Kinsinger, Arta Bruce, MD  pantoprazole (PROTONIX) 40 MG tablet Take 1 tablet (40 mg total) by mouth daily. 06/01/17 07/31/17  Kinsinger, Arta Bruce, MD  topiramate (TOPAMAX) 50 MG tablet Take 50 mg by mouth 2 (two) times daily.     [provider]  traMADol (ULTRAM) 50 MG tablet Take 1 tablet (50 mg total) by mouth every 6 (six) hours as needed. Patient taking differently:  Take 50 mg by mouth every 6 (six) hours as needed for moderate pain.  04/09/15   Veryl Speak, MD    Family History Family History  Problem Relation Age of Onset  . Diabetes Mother   . Hypertension Mother   . Hypertension Father   . Diabetes Father   . Sudden death Father   . Heart attack Neg Hx   . Hyperlipidemia Neg Hx     Social History Social History   Tobacco Use  . Smoking status: Never Smoker  . Smokeless tobacco: Never Used  Substance Use Topics  . Alcohol use: No  . Drug use: No     Allergies   Maxalt [rizatriptan benzoate]; Nsaids; Omeprazole; Adhesive [tape]; Hydrocodone; Penicillins; and Zithromax [azithromycin]   Review of Systems Review of Systems  Constitutional: Positive for fever.  HENT: Positive for congestion and rhinorrhea. Negative for trouble swallowing.   Eyes: Negative for redness.  Respiratory: Positive for cough. Negative for shortness of breath.   Cardiovascular: Negative for chest pain.  Gastrointestinal: Negative for abdominal pain and diarrhea.  Genitourinary: Negative for dysuria and flank pain.  Musculoskeletal: Positive for myalgias. Negative for neck pain and neck stiffness.  Skin: Negative for rash.  Neurological: Negative for light-headedness.  Hematological: Does not bruise/bleed easily.  Psychiatric/Behavioral: Negative for confusion.     Physical Exam Updated Vital Signs BP (!) 202/109 (BP Location: Left Arm) Comment: Patient stated that she did not take her BP medicine this AM  Pulse 62   Temp 99 F (37.2 C) (Oral)   Resp 20   Ht 1.689 m (5' 6.5")   Wt (!) 142.4 kg (314 lb)   LMP 09/12/2017   SpO2 100%   BMI 49.92 kg/m   Physical Exam  Constitutional: She appears well-developed and well-nourished. No distress.  HENT:  Mouth/Throat: Oropharynx is clear and moist.  Nasal congestion  Eyes: Conjunctivae are normal. No scleral icterus.  Neck: Neck supple. No tracheal deviation present.  No stiffness or rigidity    Cardiovascular: Normal rate, regular rhythm, normal heart sounds and intact distal pulses. Exam reveals no gallop and no friction rub.  No murmur heard. Pulmonary/Chest: Effort normal and breath sounds normal. No respiratory distress.  Abdominal: Soft. Normal appearance and bowel sounds are normal. She exhibits no distension. There is no tenderness.  Genitourinary:  Genitourinary Comments: No cva tenderness  Musculoskeletal: She exhibits no edema or tenderness.  Neurological: She is alert.  Skin: Skin is warm and dry. No rash noted. She is not diaphoretic.  Psychiatric: She has a normal mood and affect.  Nursing note and vitals reviewed.    ED Treatments / Results  Labs (all labs ordered are listed, but only abnormal results are displayed) Labs Reviewed - No data to display  EKG  EKG  Interpretation None       Radiology No results found.  Procedures Procedures (including critical care time)  Medications Ordered in ED Medications  cloNIDine (CATAPRES) tablet 0.2 mg (0.2 mg Oral Given 09/19/17 0931)  metoprolol tartrate (LOPRESSOR) tablet 100 mg (100 mg Oral Given 09/19/17 0932)  acetaminophen (TYLENOL) tablet 1,000 mg (1,000 mg Oral Given 09/19/17 0932)     Initial Impression / Assessment and Plan / ED Course  I have reviewed the triage vital signs and the nursing notes.  Pertinent labs & imaging results that were available during my care of the patient were reviewed by me and considered in my medical decision making (see chart for details).  Reviewed nursing notes and prior charts for additional history.   Patients symptoms and exam appear most c/w flu or flu-like illness. Chest cta. Pulse ox 100%. No increased wob.   Acetaminophen po.  Pt also has not taken her bp meds yet today. bp is high. Pt given a dose of her bp meds, clonidine po. Metoprolol po.  Patient current appears stable for d/c.     Final Clinical Impressions(s) / ED Diagnoses   Final diagnoses:   None    ED Discharge Orders    None       Lajean Saver, MD 09/19/17 [phone]

## 2017-09-19 NOTE — ED Notes (Signed)
Sts vomited BP med last night and did not take this a.m.

## 2017-09-20 ENCOUNTER — Ambulatory Visit: Payer: Self-pay | Admitting: Registered"

## 2017-10-02 ENCOUNTER — Ambulatory Visit: Payer: Self-pay | Admitting: Registered"

## 2017-10-25 ENCOUNTER — Emergency Department (HOSPITAL_BASED_OUTPATIENT_CLINIC_OR_DEPARTMENT_OTHER)
Admission: EM | Admit: 2017-10-25 | Discharge: 2017-10-25 | Disposition: A | Discharge status: Home / Self Care | Payer: Self-pay | Attending: Emergency Medicine | Admitting: Emergency Medicine

## 2017-10-25 ENCOUNTER — Encounter (HOSPITAL_BASED_OUTPATIENT_CLINIC_OR_DEPARTMENT_OTHER): Payer: Self-pay

## 2017-10-25 ENCOUNTER — Other Ambulatory Visit: Payer: Self-pay

## 2017-10-25 DIAGNOSIS — W57XXXA Bitten or stung by nonvenomous insect and other nonvenomous arthropods, initial encounter: Secondary | ICD-10-CM | POA: Insufficient documentation

## 2017-10-25 DIAGNOSIS — S1096XA Insect bite of unspecified part of neck, initial encounter: Secondary | ICD-10-CM | POA: Insufficient documentation

## 2017-10-25 DIAGNOSIS — Y929 Unspecified place or not applicable: Secondary | ICD-10-CM | POA: Insufficient documentation

## 2017-10-25 DIAGNOSIS — Y939 Activity, unspecified: Secondary | ICD-10-CM | POA: Insufficient documentation

## 2017-10-25 DIAGNOSIS — Y999 Unspecified external cause status: Secondary | ICD-10-CM | POA: Insufficient documentation

## 2017-10-25 DIAGNOSIS — Z5321 Procedure and treatment not carried out due to patient leaving prior to being seen by health care provider: Secondary | ICD-10-CM | POA: Insufficient documentation

## 2017-10-25 NOTE — ED Triage Notes (Signed)
Pt c/o ?insect bite to posterior neck-no redness noted to pain site area-NAD-steady gait

## 2017-10-30 ENCOUNTER — Ambulatory Visit: Payer: Self-pay | Admitting: Psychology

## 2017-10-30 ENCOUNTER — Emergency Department (HOSPITAL_BASED_OUTPATIENT_CLINIC_OR_DEPARTMENT_OTHER): Payer: Self-pay

## 2017-10-30 ENCOUNTER — Other Ambulatory Visit: Payer: Self-pay

## 2017-10-30 ENCOUNTER — Emergency Department (HOSPITAL_BASED_OUTPATIENT_CLINIC_OR_DEPARTMENT_OTHER)
Admission: EM | Admit: 2017-10-30 | Discharge: 2017-10-30 | Disposition: A | Discharge status: Home / Self Care | Payer: Self-pay | Attending: Emergency Medicine | Admitting: Emergency Medicine

## 2017-10-30 ENCOUNTER — Encounter (HOSPITAL_BASED_OUTPATIENT_CLINIC_OR_DEPARTMENT_OTHER): Payer: Self-pay | Admitting: *Deleted

## 2017-10-30 DIAGNOSIS — E039 Hypothyroidism, unspecified: Secondary | ICD-10-CM | POA: Insufficient documentation

## 2017-10-30 DIAGNOSIS — W2209XA Striking against other stationary object, initial encounter: Secondary | ICD-10-CM | POA: Insufficient documentation

## 2017-10-30 DIAGNOSIS — Y939 Activity, unspecified: Secondary | ICD-10-CM | POA: Insufficient documentation

## 2017-10-30 DIAGNOSIS — I10 Essential (primary) hypertension: Secondary | ICD-10-CM | POA: Insufficient documentation

## 2017-10-30 DIAGNOSIS — Y92480 Sidewalk as the place of occurrence of the external cause: Secondary | ICD-10-CM | POA: Insufficient documentation

## 2017-10-30 DIAGNOSIS — J45909 Unspecified asthma, uncomplicated: Secondary | ICD-10-CM | POA: Insufficient documentation

## 2017-10-30 DIAGNOSIS — Y999 Unspecified external cause status: Secondary | ICD-10-CM | POA: Insufficient documentation

## 2017-10-30 DIAGNOSIS — S93522A Sprain of metatarsophalangeal joint of left great toe, initial encounter: Secondary | ICD-10-CM | POA: Insufficient documentation

## 2017-10-30 DIAGNOSIS — Z79899 Other long term (current) drug therapy: Secondary | ICD-10-CM | POA: Insufficient documentation

## 2017-10-30 NOTE — ED Triage Notes (Signed)
Pt states she tripped and fell off the curb into a car. C/o left foot pain. Denies any other injury besides her foot pain. C/o pain to her left great toe and 2nd toe.  Pt has not taken any medications for pain. No obvious deformity noted.

## 2017-10-30 NOTE — ED Provider Notes (Signed)
[facility] EMERGENCY DEPARTMENT Provider Note   CSN: [id number] Arrival date & time: 10/30/17  0136     History   Chief Complaint Chief Complaint  Patient presents with  . toe pain    HPI Dawn Thomas is a 42 y.o. female.  The history is provided by the patient.  She has history of asthma, hypertension, chronic kidney disease, sleep apnea and comes in having injured her left first and second toe.  She states that she tripped over a raised pruritic.  She did not fall.  She is complaining of pain in the left first and second toes which she rates at 8/10.  It is painful to bear weight, so she has been walking on her heels.  She denies other injury.  Past Medical History:  Diagnosis Date  . Asthma   . Complication of anesthesia    woke up during surgery   . Hypertension   . Hypothyroidism   . Migraines   . Renal disorder    stage 3- Dr [facility] - Dr Olivia Mackie  . Renal insufficiency   . Sleep apnea    mild   . Tendonitis   . Thyroid disease     Patient Active Problem List   Diagnosis Date Noted  . Acute kidney injury ([place]) 05/31/2017  . Bradycardia 05/31/2017  . Morbid obesity ([place]) 05/30/2017  . Right ankle pain 01/18/2012    Past Surgical History:  Procedure Laterality Date  . CESAREAN SECTION    . CHOLECYSTECTOMY    . DILATION AND CURETTAGE OF UTERUS    . LAPAROSCOPIC GASTRIC SLEEVE RESECTION N/A 05/30/2017   Procedure: LAPAROSCOPIC GASTRIC SLEEVE RESECTION WITH UPPPER ENDO AND HIATAL HERNIA REPAIR;  Surgeon: Kieth Brightly, Arta Bruce, MD;  Location: [facility];  Service: General;  Laterality: N/A;     OB History   None      Home Medications    Prior to Admission medications   Medication Sig Start Date End Date Taking? Authorizing Provider  albuterol (PROVENTIL HFA;VENTOLIN HFA) 108 (90 BASE) MCG/ACT inhaler Inhale 2 puffs into the lungs every 6 (six) hours as needed for wheezing or shortness of breath.     [provider]  allopurinol  (ZYLOPRIM) 300 MG tablet Take 300 mg by mouth daily.     [provider]  cloNIDine (CATAPRES) 0.2 MG tablet Take 0.2 mg by mouth 2 (two) times daily.    [provider]  ergocalciferol (VITAMIN D2) 50000 units capsule Take 50,000 Units by mouth every Monday.    [provider]  ferrous sulfate 300 (60 Fe) MG/5ML syrup Take 5 mLs (300 mg total) by mouth 2 (two) times daily with a meal. 06/02/17   Kinsinger, Arta Bruce, MD  levothyroxine (SYNTHROID, LEVOTHROID) 75 MCG tablet Take 75 mcg by mouth daily.     [provider]  losartan-hydrochlorothiazide (HYZAAR) 100-25 MG tablet Take 1 tablet by mouth daily.      [provider]  metoprolol tartrate (LOPRESSOR) 100 MG tablet Take 100 mg by mouth 2 (two) times daily. 05/17/17   [provider]  ondansetron (ZOFRAN ODT) 4 MG disintegrating tablet Take 1 tablet (4 mg total) by mouth every 8 (eight) hours as needed for nausea or vomiting. 06/01/17   Kinsinger, Arta Bruce, MD  oseltamivir (TAMIFLU) 75 MG capsule Take 1 capsule (75 mg total) by mouth every 12 (twelve) hours. 09/19/17   Lajean Saver, MD  oxyCODONE (ROXICODONE) 5 MG/5ML solution Take 5 mLs (5 mg total)  by mouth every 4 (four) hours as needed for severe pain (moderate pain not relieved by Ultram). 06/01/17   Kinsinger, Arta Bruce, MD  pantoprazole (PROTONIX) 40 MG tablet Take 1 tablet (40 mg total) by mouth daily. 06/01/17 07/31/17  Kinsinger, Arta Bruce, MD  topiramate (TOPAMAX) 50 MG tablet Take 50 mg by mouth 2 (two) times daily.     [provider]  traMADol (ULTRAM) 50 MG tablet Take 1 tablet (50 mg total) by mouth every 6 (six) hours as needed. Patient taking differently: Take 50 mg by mouth every 6 (six) hours as needed for moderate pain.  04/09/15   Veryl Speak, MD    Family History Family History  Problem Relation Age of Onset  . Diabetes Mother   . Hypertension Mother   . Hypertension Father   . Diabetes Father   .  Sudden death Father   . Heart attack Neg Hx   . Hyperlipidemia Neg Hx     Social History Social History   Tobacco Use  . Smoking status: Never Smoker  . Smokeless tobacco: Never Used  Substance Use Topics  . Alcohol use: No  . Drug use: No     Allergies   Maxalt [rizatriptan benzoate]; Nsaids; Omeprazole; Adhesive [tape]; Hydrocodone; Penicillins; and Zithromax [azithromycin]   Review of Systems Review of Systems  All other systems reviewed and are negative.    Physical Exam Updated Vital Signs BP (!) 193/110 (BP Location: Left Arm)   Pulse 68   Temp 98.1 F (36.7 C) (Oral)   Resp 19   Ht 5\' 6"  (1.676 m)   Wt (!) 137.4 kg (303 lb)   LMP 10/16/2017 (Approximate)   SpO2 98%   BMI 48.91 kg/m   Physical Exam  Nursing note and vitals reviewed.  42 year old female, resting comfortably and in no acute distress. Vital signs are significant for elevated blood pressure. Oxygen saturation is 98%, which is normal. Head is normocephalic and atraumatic. PERRLA, EOMI. Oropharynx is clear. Neck is nontender and supple without adenopathy or JVD. Back is nontender and there is no CVA tenderness. Lungs are clear without rales, wheezes, or rhonchi. Chest is nontender. Heart has regular rate and rhythm without murmur. Abdomen is soft, flat, nontender without masses or hepatosplenomegaly and peristalsis is normoactive. Extremities have 1+ edema.  There is mild swelling over the region of the left first MTP joint.  There is tenderness to palpation over the left first and second MTP joints.  There is no instability.  No other tenderness elicited.  Neurovascular exam is intact. Skin is warm and dry without rash. Neurologic: Mental status is normal, cranial nerves are intact, there are no motor or sensory deficits.  ED Treatments / Results   Radiology Dg Foot Complete Left  Result Date: 10/30/2017 CLINICAL DATA:  Tripped and fell off curb into car.  LEFT toe pain. EXAM: LEFT FOOT -  COMPLETE 3+ VIEW COMPARISON:  LEFT ankle radiograph January 24, 2017 and LEFT total radiograph April 27, 2016 FINDINGS: No fracture deformity or dislocation. Mild hallux valgus with lateral subluxation and mild first MTP osteoarthrosis. Small plantar calcaneal spur and plantar fascia enthesopathy. Mild midfoot osteoarthrosis. Mild dorsal foot soft tissue swelling without subcutaneous gas or radiopaque foreign bodies. IMPRESSION: Soft tissue swelling without acute osseous process. First MTP and midfoot mild osteoarthrosis. Electronically Signed   By: Elon Alas M.D.   On: 10/30/2017 03:08    Procedures Procedures   Medications Ordered in ED Medications - No  data to display   Initial Impression / Assessment and Plan / ED Course  I have reviewed the triage vital signs and the nursing notes.  Pertinent results that were available during my care of the patient were reviewed by me and considered in my medical decision making (see chart for details).  Sprain of the left first MTP joint, possible sprain of the left second MTP joint.  X-rays show no evidence of fracture.  She is placed in a postop shoe for comfort and given crutches to use as needed.  She cannot take NSAIDs because of renal insufficiency-advised to use acetaminophen for pain.  Advised on ice and elevation.  Follow-up with sports medicine if not improving over the next several days.  Final Clinical Impressions(s) / ED Diagnoses   Final diagnoses:  Sprain of metatarsophalangeal joint of left great toe, initial encounter    ED Discharge Orders    None       Delora Fuel, MD 69/48/54 [phone]

## 2017-10-30 NOTE — Discharge Instructions (Signed)
Apply ice several times a day. Keep your foot elevated when not walking on it. Wear the post-op shoe and use the crutches as needed. Take acetaminophen as needed for pain.

## 2017-12-11 ENCOUNTER — Emergency Department (HOSPITAL_BASED_OUTPATIENT_CLINIC_OR_DEPARTMENT_OTHER): Payer: Self-pay

## 2017-12-11 ENCOUNTER — Other Ambulatory Visit: Payer: Self-pay

## 2017-12-11 ENCOUNTER — Emergency Department (HOSPITAL_BASED_OUTPATIENT_CLINIC_OR_DEPARTMENT_OTHER)
Admission: EM | Admit: 2017-12-11 | Discharge: 2017-12-11 | Disposition: A | Discharge status: Home / Self Care | Payer: Self-pay | Attending: Emergency Medicine | Admitting: Emergency Medicine

## 2017-12-11 ENCOUNTER — Encounter (HOSPITAL_BASED_OUTPATIENT_CLINIC_OR_DEPARTMENT_OTHER): Payer: Self-pay | Admitting: *Deleted

## 2017-12-11 DIAGNOSIS — I129 Hypertensive chronic kidney disease with stage 1 through stage 4 chronic kidney disease, or unspecified chronic kidney disease: Secondary | ICD-10-CM | POA: Insufficient documentation

## 2017-12-11 DIAGNOSIS — J45909 Unspecified asthma, uncomplicated: Secondary | ICD-10-CM | POA: Insufficient documentation

## 2017-12-11 DIAGNOSIS — Z79899 Other long term (current) drug therapy: Secondary | ICD-10-CM | POA: Insufficient documentation

## 2017-12-11 DIAGNOSIS — E039 Hypothyroidism, unspecified: Secondary | ICD-10-CM | POA: Insufficient documentation

## 2017-12-11 DIAGNOSIS — N183 Chronic kidney disease, stage 3 (moderate): Secondary | ICD-10-CM | POA: Insufficient documentation

## 2017-12-11 DIAGNOSIS — Z9884 Bariatric surgery status: Secondary | ICD-10-CM | POA: Insufficient documentation

## 2017-12-11 DIAGNOSIS — N3000 Acute cystitis without hematuria: Secondary | ICD-10-CM | POA: Insufficient documentation

## 2017-12-11 DIAGNOSIS — E860 Dehydration: Secondary | ICD-10-CM | POA: Insufficient documentation

## 2017-12-11 DIAGNOSIS — Z9049 Acquired absence of other specified parts of digestive tract: Secondary | ICD-10-CM | POA: Insufficient documentation

## 2017-12-11 LAB — COMPREHENSIVE METABOLIC PANEL
ALBUMIN: 4.2 g/dL (ref 3.5–5.0)
ALK PHOS: 58 U/L (ref 38–126)
ALT: 11 U/L — ABNORMAL LOW (ref 14–54)
AST: 15 U/L (ref 15–41)
Anion gap: 5 (ref 5–15)
BILIRUBIN TOTAL: 0.4 mg/dL (ref 0.3–1.2)
BUN: 19 mg/dL (ref 6–20)
CALCIUM: 9.1 mg/dL (ref 8.9–10.3)
CO2: 22 mmol/L (ref 22–32)
Chloride: 110 mmol/L (ref 101–111)
Creatinine, Ser: 1.58 mg/dL — ABNORMAL HIGH (ref 0.44–1.00)
GFR calc Af Amer: 46 mL/min — ABNORMAL LOW (ref >60)
GFR, EST NON AFRICAN AMERICAN: 40 mL/min — AB (ref >60)
GLUCOSE: 94 mg/dL (ref 65–99)
POTASSIUM: 3.9 mmol/L (ref 3.5–5.1)
Sodium: 137 mmol/L (ref 135–145)
TOTAL PROTEIN: 8.1 g/dL (ref 6.5–8.1)

## 2017-12-11 LAB — CBC WITH DIFFERENTIAL/PLATELET
BASOS ABS: 0 10*3/uL (ref 0.0–0.1)
BASOS PCT: 0 %
Eosinophils Absolute: 0.1 10*3/uL (ref 0.0–0.7)
Eosinophils Relative: 1 %
HEMATOCRIT: 39.6 % (ref 36.0–46.0)
Hemoglobin: 13.1 g/dL (ref 12.0–15.0)
LYMPHS PCT: 33 %
Lymphs Abs: 2.7 10*3/uL (ref 0.7–4.0)
MCH: 30.3 pg (ref 26.0–34.0)
MCHC: 33.1 g/dL (ref 30.0–36.0)
MCV: 91.7 fL (ref 78.0–100.0)
Monocytes Absolute: 0.4 10*3/uL (ref 0.1–1.0)
Monocytes Relative: 4 %
NEUTROS ABS: 5.1 10*3/uL (ref 1.7–7.7)
NEUTROS PCT: 62 %
Platelets: 256 10*3/uL (ref 150–400)
RBC: 4.32 MIL/uL (ref 3.87–5.11)
RDW: 14.9 % (ref 11.5–15.5)
WBC: 8.3 10*3/uL (ref 4.0–10.5)

## 2017-12-11 LAB — URINALYSIS, ROUTINE W REFLEX MICROSCOPIC
BILIRUBIN URINE: NEGATIVE
GLUCOSE, UA: NEGATIVE mg/dL
KETONES UR: 15 mg/dL — AB
NITRITE: POSITIVE — AB
Protein, ur: >300 mg/dL — AB
Specific Gravity, Urine: >1.03 — ABNORMAL HIGH (ref 1.005–1.030)
pH: 5 (ref 5.0–8.0)

## 2017-12-11 LAB — LIPASE, BLOOD: Lipase: 29 U/L (ref 11–51)

## 2017-12-11 LAB — URINALYSIS, MICROSCOPIC (REFLEX): RBC / HPF: >50 RBC/hpf (ref 0–5)

## 2017-12-11 LAB — PREGNANCY, URINE: Preg Test, Ur: NEGATIVE

## 2017-12-11 MED ORDER — METOCLOPRAMIDE HCL 10 MG PO TABS: 10.0000 mg | ORAL_TABLET | Freq: Three times a day (TID) | ORAL | 0 refills | Status: DC | PRN

## 2017-12-11 MED ORDER — SODIUM CHLORIDE 0.9 % IV BOLUS
1000.0000 mL | Freq: Once | INTRAVENOUS | Status: AC
  Administered 2017-12-11: 1000 mL via INTRAVENOUS

## 2017-12-11 MED ORDER — ONDANSETRON HCL 4 MG/2ML IJ SOLN
4.0000 mg | Freq: Once | INTRAMUSCULAR | Status: AC
  Administered 2017-12-11: 4 mg via INTRAVENOUS
  Filled 2017-12-11: qty 2

## 2017-12-11 MED ORDER — CIPROFLOXACIN IN D5W 400 MG/200ML IV SOLN
400.0000 mg | Freq: Once | INTRAVENOUS | Status: AC
  Administered 2017-12-11: 400 mg via INTRAVENOUS
  Filled 2017-12-11: qty 200

## 2017-12-11 MED ORDER — PROMETHAZINE HCL 25 MG RE SUPP: 25.0000 mg | Freq: Four times a day (QID) | RECTAL | 0 refills | Status: DC | PRN

## 2017-12-11 MED ORDER — CIPROFLOXACIN HCL 500 MG PO TABS: 500.0000 mg | ORAL_TABLET | Freq: Two times a day (BID) | ORAL | 0 refills | Status: AC

## 2017-12-11 MED ORDER — HYDROMORPHONE HCL 1 MG/ML IJ SOLN
1.0000 mg | Freq: Once | INTRAMUSCULAR | Status: AC
  Administered 2017-12-11: 1 mg via INTRAVENOUS
  Filled 2017-12-11: qty 1

## 2017-12-11 NOTE — Discharge Instructions (Addendum)
Her lab work today was overall reassuring, but did show a significant urine infection.  This could be contributing to your nausea and vomiting as well as pain.  Take the antibiotics as prescribed.  Use the anti-nausea medications as needed.  Make sure you drink at least 6 to 8 glasses of water daily.  I would recommend staying out of work until you feel better, at least for the next 24 hours.

## 2017-12-11 NOTE — ED Notes (Signed)
IV attempt x1 with ultra right AC unsuccessful.  Blood obtained for labs. RN aware

## 2017-12-11 NOTE — ED Notes (Signed)
ED Provider at bedside. 

## 2017-12-11 NOTE — ED Notes (Signed)
Multiple attempts made for IV access. Blood sent. Patient is laughing and talking with friend on phone. No distress noted.

## 2017-12-11 NOTE — ED Provider Notes (Signed)
[facility] EMERGENCY DEPARTMENT Provider Note   CSN: [id number] Arrival date & time: 12/11/17  1441     History   Chief Complaint Chief Complaint  Patient presents with  . Abdominal Pain    HPI Dawn Thomas is a 42 y.o. female.  HPI   42 yo F here with abd pain. Pt has h/o CKD, HTN, gastric sleeve. She reports that yesterday, she began to develop aching, cramp like epigatric abd pain after eating. Since then, she's had persistent mild cramp-like pain. Pain is worse with eating, palpation. She's had nausea but no vomiting. She has not taken anything for this. No alleviating factors. No fever or chills. No urinary sx. 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 Last BM was this week, no other complications.  Past Medical History:  Diagnosis Date  . Asthma   . Complication of anesthesia    woke up during surgery   . Hypertension   . Hypothyroidism   . Migraines   . Renal disorder    stage 3- [facility] - Dr Olivia Mackie  . Renal insufficiency   . Sleep apnea    mild   . Tendonitis   . Thyroid disease     Patient Active Problem List   Diagnosis Date Noted  . Acute kidney injury ([place]) 05/31/2017  . Bradycardia 05/31/2017  . Morbid obesity ([place]) 05/30/2017  . Right ankle pain 01/18/2012    Past Surgical History:  Procedure Laterality Date  . CESAREAN SECTION    . CHOLECYSTECTOMY    . DILATION AND CURETTAGE OF UTERUS    . LAPAROSCOPIC GASTRIC SLEEVE RESECTION N/A 05/30/2017   Procedure: LAPAROSCOPIC GASTRIC SLEEVE RESECTION WITH UPPPER ENDO AND HIATAL HERNIA REPAIR;  Surgeon: Kieth Brightly, Arta Bruce, MD;  Location: [facility] ORS;  Service: General;  Laterality: N/A;     OB History   None      Home Medications    Prior to Admission medications   Medication Sig Start Date End Date Taking? Authorizing Provider  albuterol (PROVENTIL HFA;VENTOLIN HFA) 108 (90 BASE) MCG/ACT inhaler Inhale 2 puffs into the lungs every 6 (six) hours as needed  for wheezing or shortness of breath.     [provider]  allopurinol (ZYLOPRIM) 300 MG tablet Take 300 mg by mouth daily.     [provider]  ciprofloxacin (CIPRO) 500 MG tablet Take 1 tablet (500 mg total) by mouth every 12 (twelve) hours for 10 days. 12/11/17 12/21/17  Duffy Bruce, MD  cloNIDine (CATAPRES) 0.2 MG tablet Take 0.2 mg by mouth 2 (two) times daily.    [provider]  ergocalciferol (VITAMIN D2) 50000 units capsule Take 50,000 Units by mouth every Monday.    [provider]  ferrous sulfate 300 (60 Fe) MG/5ML syrup Take 5 mLs (300 mg total) by mouth 2 (two) times daily with a meal. 06/02/17   Kinsinger, Arta Bruce, MD  levothyroxine (SYNTHROID, LEVOTHROID) 75 MCG tablet Take 75 mcg by mouth daily.     [provider]  losartan-hydrochlorothiazide (HYZAAR) 100-25 MG tablet Take 1 tablet by mouth daily.      [provider]  metoCLOPramide (REGLAN) 10 MG tablet Take 1 tablet (10 mg total) by mouth every 8 (eight) hours as needed for nausea or vomiting. 12/11/17   Duffy Bruce, MD  metoprolol tartrate (LOPRESSOR) 100 MG tablet Take 100 mg by mouth 2 (two) times daily. 05/17/17   [provider]  oseltamivir (TAMIFLU) 75 MG capsule  Take 1 capsule (75 mg total) by mouth every 12 (twelve) hours. 09/19/17   Lajean Saver, MD  oxyCODONE (ROXICODONE) 5 MG/5ML solution Take 5 mLs (5 mg total) by mouth every 4 (four) hours as needed for severe pain (moderate pain not relieved by Ultram). 06/01/17   Kinsinger, Arta Bruce, MD  pantoprazole (PROTONIX) 40 MG tablet Take 1 tablet (40 mg total) by mouth daily. 06/01/17 07/31/17  Kinsinger, Arta Bruce, MD  promethazine (PHENERGAN) 25 MG suppository Place 1 suppository (25 mg total) rectally every 6 (six) hours as needed for refractory nausea / vomiting. 12/11/17   Duffy Bruce, MD  topiramate (TOPAMAX) 50 MG tablet Take 50 mg by mouth 2 (two) times daily.     [provider]    traMADol (ULTRAM) 50 MG tablet Take 1 tablet (50 mg total) by mouth every 6 (six) hours as needed. Patient taking differently: Take 50 mg by mouth every 6 (six) hours as needed for moderate pain.  04/09/15   Veryl Speak, MD    Family History Family History  Problem Relation Age of Onset  . Diabetes Mother   . Hypertension Mother   . Hypertension Father   . Diabetes Father   . Sudden death Father   . Heart attack Neg Hx   . Hyperlipidemia Neg Hx     Social History Social History   Tobacco Use  . Smoking status: Never Smoker  . Smokeless tobacco: Never Used  Substance Use Topics  . Alcohol use: No  . Drug use: No     Allergies   Maxalt [rizatriptan benzoate]; Nsaids; Omeprazole; Adhesive [tape]; Hydrocodone; Penicillins; and Zithromax [azithromycin]   Review of Systems Review of Systems  Constitutional: Positive for fatigue. Negative for chills and fever.  HENT: Negative for congestion and rhinorrhea.   Eyes: Negative for visual disturbance.  Respiratory: Negative for cough, shortness of breath and wheezing.   Cardiovascular: Negative for chest pain and leg swelling.  Gastrointestinal: Positive for abdominal distention, abdominal pain and nausea. Negative for diarrhea and vomiting.  Genitourinary: Negative for dysuria and flank pain.  Musculoskeletal: Negative for neck pain and neck stiffness.  Skin: Negative for rash and wound.  Allergic/Immunologic: Negative for immunocompromised state.  Neurological: Positive for weakness. Negative for syncope and headaches.  All other systems reviewed and are negative.    Physical Exam Updated Vital Signs BP (!) 188/90 (BP Location: Right Arm)   Pulse (!) 50   Temp 98 F (36.7 C) (Oral)   Resp 16   Ht 5\' 6"  (1.676 m)   Wt (!) 137.4 kg (303 lb)   LMP 12/10/2017   SpO2 100%   BMI 48.91 kg/m   Physical Exam  Constitutional: She is oriented to person, place, and time. She appears well-developed and well-nourished. No  distress.  HENT:  Head: Normocephalic and atraumatic.  Eyes: Conjunctivae are normal.  Neck: Neck supple.  Cardiovascular: Normal rate, regular rhythm and normal heart sounds. Exam reveals no friction rub.  No murmur heard. Pulmonary/Chest: Effort normal and breath sounds normal. No respiratory distress. She has no wheezes. She has no rales.  Abdominal: She exhibits no distension. There is tenderness in the epigastric area. There is no rigidity, no rebound and no guarding.  Musculoskeletal: She exhibits no edema.  Neurological: She is alert and oriented to person, place, and time. She exhibits normal muscle tone.  Skin: Skin is warm. Capillary refill takes less than 2 seconds.  Psychiatric: She has a normal mood and affect.  Nursing  note and vitals reviewed.    ED Treatments / Results  Labs (all labs ordered are listed, but only abnormal results are displayed) Labs Reviewed  URINALYSIS, ROUTINE W REFLEX MICROSCOPIC - Abnormal; Notable for the following components:      Result Value   APPearance CLOUDY (*)    Specific Gravity, Urine >1.030 (*)    Hgb urine dipstick LARGE (*)    Ketones, ur 15 (*)    Protein, ur >300 (*)    Nitrite POSITIVE (*)    Leukocytes, UA MODERATE (*)    All other components within normal limits  URINALYSIS, MICROSCOPIC (REFLEX) - Abnormal; Notable for the following components:   Bacteria, UA FEW (*)    All other components within normal limits  COMPREHENSIVE METABOLIC PANEL - Abnormal; Notable for the following components:   Creatinine, Ser 1.58 (*)    ALT 11 (*)    GFR calc non Af Amer 40 (*)    GFR calc Af Amer 46 (*)    All other components within normal limits  URINE CULTURE  PREGNANCY, URINE  CBC WITH DIFFERENTIAL/PLATELET  LIPASE, BLOOD    EKG None  Radiology Ct Abdomen Pelvis Wo Contrast  Result Date: 12/11/2017 CLINICAL DATA:  Upper abdominal pain for 2 days. Nausea, vomiting, diarrhea. History of hypertension, asthma, renal disorder,  cholecystectomy, C-section, gastric sleeve. EXAM: CT ABDOMEN AND PELVIS WITHOUT CONTRAST TECHNIQUE: Multidetector CT imaging of the abdomen and pelvis was performed following the standard protocol without IV contrast. COMPARISON:  CT abdomen dated 08/08/2017. FINDINGS: Lower chest: No acute abnormality. Hepatobiliary: No focal liver abnormality is seen. Status post cholecystectomy. No biliary dilatation. Pancreas: Unremarkable. No pancreatic ductal dilatation or surrounding inflammatory changes. Spleen: Normal in size without focal abnormality. Adrenals/Urinary Tract: Adrenal glands appear normal. Kidneys are unremarkable without mass, stone or hydronephrosis. No ureteral or bladder calculi identified. Probable phleboliths again noted adjacent to the posterior-inferior margin of the bladder. Stomach/Bowel: Bowel is normal in caliber. No bowel wall thickening or evidence of bowel wall inflammation seen. Appendix is normal. Status post gastric sleeve. Stomach otherwise unremarkable. Vascular/Lymphatic: No significant vascular findings are present. No enlarged abdominal or pelvic lymph nodes. Reproductive: Uterus and bilateral adnexa are unremarkable. Other: No free fluid or abscess collection. No free intraperitoneal air. Musculoskeletal: Mild degenerative changes within the thoracolumbar spine. No acute or suspicious osseous finding. IMPRESSION: No acute findings within the abdomen or pelvis. No bowel obstruction or evidence of bowel wall inflammation. No evidence of acute solid organ abnormality. No renal or ureteral calculi. Surgical changes of previous gastric sleeve procedure, without obstruction, inflammation or other surgical complicating feature. Electronically Signed   By: Franki Cabot M.D.   On: 12/11/2017 20:18    Procedures Procedures (including critical care time)  Medications Ordered in ED Medications  sodium chloride 0.9 % bolus 1,000 mL (0 mLs Intravenous Stopped 12/11/17 2041)  ondansetron  (ZOFRAN) injection 4 mg (4 mg Intravenous Given 12/11/17 1929)  HYDROmorphone (DILAUDID) injection 1 mg (1 mg Intravenous Given 12/11/17 1929)  ciprofloxacin (CIPRO) IVPB 400 mg (0 mg Intravenous Stopped 12/11/17 2135)     Initial Impression / Assessment and Plan / ED Course  I have reviewed the triage vital signs and the nursing notes.  Pertinent labs & imaging results that were available during my care of the patient were reviewed by me and considered in my medical decision making (see chart for details).     42 yo F with PMHx as above here with abd pain, n/v.  On exam, pt is well appearing and in NAD. However, given her h/o gastric sleeve, will check CT. DDx broad, includes gastritis, pancreatitis, GERD, viral illness (works with toddlers), UTI/pyelo, obstruction. Pt is otherwise AF, HDS. She is bradycardic which is her baseline.  Labs as above and are reassuring. CBC w/o leukocytosis. CMP at baseline. Lipase normal. CT scan neg. UA however is c/f UTI with dehydration. Will tx as pyelo given her n/v. Pt feel simproved with fluids, pain control and is eating/drinking without difficulty here. Given allergies, will tx with Cipro, give antiemetics, and advise outpt follow-up with good return precautions.  Final Clinical Impressions(s) / ED Diagnoses   Final diagnoses:  Acute cystitis without hematuria  Dehydration    ED Discharge Orders        Ordered    ciprofloxacin (CIPRO) 500 MG tablet  Every 12 hours     12/11/17 2053    promethazine (PHENERGAN) 25 MG suppository  Every 6 hours PRN     12/11/17 2053    metoCLOPramide (REGLAN) 10 MG tablet  Every 8 hours PRN     12/11/17 2053       Duffy Bruce, MD 12/12/17 [phone]

## 2017-12-11 NOTE — ED Triage Notes (Signed)
Pt c/o upper abd pain x 2 days with n/v/d

## 2017-12-13 LAB — URINE CULTURE

## 2018-04-09 ENCOUNTER — Ambulatory Visit: Payer: Self-pay | Admitting: Registered"

## 2018-04-24 ENCOUNTER — Encounter: Payer: Medicaid Other | Attending: General Surgery | Admitting: Registered"

## 2018-04-24 ENCOUNTER — Encounter: Payer: Self-pay | Admitting: Registered"

## 2018-04-24 DIAGNOSIS — Z9884 Bariatric surgery status: Secondary | ICD-10-CM | POA: Diagnosis not present

## 2018-04-24 DIAGNOSIS — E669 Obesity, unspecified: Secondary | ICD-10-CM | POA: Insufficient documentation

## 2018-04-24 NOTE — Patient Instructions (Addendum)
-   Aim to eat every 3-5 hours.   - Aim to eat least 60 grams of protein.   - Eat a variety of food groups: including vegetables, fruits, and whole grains.   - Get back on track taking 3 calcium supplements a day.   - Keep up the great work staying hydrated.   - Get connected with monthly support group.

## 2018-04-24 NOTE — Progress Notes (Signed)
Follow-up visit: 11 Months Post-Operative Sleeve gastrectomy Surgery  Medical Nutrition Therapy:  Appt start time: 3:42 end time: 4:20.  Primary concerns today: Post-operative Bariatric Surgery Nutrition Management.  Non scale victories: blood pressure has improved, from size 30-26  Surgery date: 05/30/2017 Surgery type: Sleeve gastrectomy Start weight at [facility]: 400.9 lbs Weight today: 290.4 lbs Weight change: 43.4 lbs from 333.8 (07/19/2017) Total weight lost: 110.5 lbs Weight loss goal: ~250 lbs, reduce medications   TANITA  BODY COMP RESULTS  06/13/2017 07/19/2017 04/24/2018   BMI (kg/m^2) N/A 53.9 46.9   Fat Mass (lbs)  175.2 118.4   Fat Free Mass (lbs)  158.6 172.0   Total Body Water (lbs)  119.0 126.6    Pt states she tolerates steak, deli [place], chicken, and seafood well; does not tolerate ground beef, eggs, [place], or sausage links. Pt states she is [redacted] wks pregnant as of today. Pt states she eats every 2-3 hours but has recall reveals some skipped meals. Pt states she is not hungry for dinner; making herself eat. Pt states she still drinks protein water when she does not feel like eating meat. Pt states she has inconsistent bowel movements and gets constipated at time. Pt states she could not tolerate bariatric multivitamin and changed to OTC prenatal chewable. Pt states she did not have insurance for a while therefore discontinued nutrition visits with [place].   Pt states next appt with surgeon is Oct 1. Pt states she has been eating a lot potatoes lately and craving fries and ice cream. 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    Preferred Learning Style:   No preference indicated   Learning Readiness:   Contemplating  Ready  Change in progress  24-hr recall: poor historian B (AM): skips; [employer]-1/4 burrito bowl (beans, chicken, cheese, 21g) Snk (AM): none  L (PM): [employer]-3/4 steak chalupa  Snk (PM): [employer]vita crackers  D (PM): 1/2  salad + grilled chicken + vinaigrette  Snk (PM): none  Fluid intake: water (60 oz), sprite or ginger ale,  Juice, protein water; 64+ oz Estimated total protein intake: ~51 grams  Medications: See list Supplementation:  OTC prenatal multivitamin + vitamin D + biotin + 2 TUMS   Using straws: no Drinking while eating: no Having you been chewing well: most times Chewing/swallowing difficulties: no Changes in vision: no Changes to mood/headaches: no Hair loss/Changes to skin/Changes to nails: yes, no, no Any difficulty focusing or concentrating: no Sweating: no Dizziness/Lightheaded: no Palpitations: no  Carbonated beverages: no N/V/D/C/GAS: sometimes, no, no, yes-Miralax, no Abdominal Pain: no Dumping syndrome: no Last Lap-Band fill: N/A  Recent physical activity:  Walking 60 min, 3-4 days/week  Progress Towards Goal(s):  In progress.  Handouts given during visit include:  none   Nutritional Diagnosis:  [place]-3.3 Overweight/obesity related to past poor dietary habits and physical inactivity as evidenced by patient w/ recent sleeve gastrectomy surgery following dietary guidelines for continued weight loss.     Intervention:  Nutrition education and counseling. Pt was educated and counseled on the importance of meeting daily needs, eating a wide variety of food groups, taking appropriate supplements, and having structure throughout her day to eat multiple times a day. Pt was in agreement with goals listed.  Goals: - Aim to eat every 3-5 hours.  - Aim to eat least 60 grams of protein.  - Eat a variety of food groups: including vegetables, fruits, and whole grains.  - Get back on track taking  3 calcium supplements a day.  - Keep up the great work staying hydrated.  - Get connected with monthly support group.   Teaching Method Utilized:  Visual Auditory Hands on  Barriers to learning/adherence to lifestyle change: contemplative stage of change  Demonstrated degree of  understanding via:  Teach Back   Monitoring/Evaluation:  Dietary intake, exercise, lap band fills, and body weight. Follow up in 1 months for 12 month post-op visit.

## 2018-05-24 ENCOUNTER — Ambulatory Visit: Payer: Self-pay | Admitting: Registered"

## 2018-07-11 ENCOUNTER — Other Ambulatory Visit: Payer: Self-pay

## 2018-07-11 ENCOUNTER — Encounter (HOSPITAL_BASED_OUTPATIENT_CLINIC_OR_DEPARTMENT_OTHER): Payer: Self-pay | Admitting: Emergency Medicine

## 2018-07-11 ENCOUNTER — Emergency Department (HOSPITAL_BASED_OUTPATIENT_CLINIC_OR_DEPARTMENT_OTHER): Payer: Medicaid Other

## 2018-07-11 ENCOUNTER — Inpatient Hospital Stay (HOSPITAL_BASED_OUTPATIENT_CLINIC_OR_DEPARTMENT_OTHER)
Admission: EM | Admit: 2018-07-11 | Discharge: 2018-07-14 | DRG: 291 | Disposition: A | Discharge status: Home / Self Care | Payer: Medicaid Other | Attending: Internal Medicine | Admitting: Internal Medicine

## 2018-07-11 DIAGNOSIS — J45909 Unspecified asthma, uncomplicated: Secondary | ICD-10-CM | POA: Diagnosis present

## 2018-07-11 DIAGNOSIS — R0600 Dyspnea, unspecified: Secondary | ICD-10-CM

## 2018-07-11 DIAGNOSIS — Z79899 Other long term (current) drug therapy: Secondary | ICD-10-CM

## 2018-07-11 DIAGNOSIS — J9601 Acute respiratory failure with hypoxia: Secondary | ICD-10-CM | POA: Diagnosis present

## 2018-07-11 DIAGNOSIS — J188 Other pneumonia, unspecified organism: Secondary | ICD-10-CM

## 2018-07-11 DIAGNOSIS — Z9884 Bariatric surgery status: Secondary | ICD-10-CM | POA: Diagnosis not present

## 2018-07-11 DIAGNOSIS — G43909 Migraine, unspecified, not intractable, without status migrainosus: Secondary | ICD-10-CM | POA: Diagnosis present

## 2018-07-11 DIAGNOSIS — Z9119 Patient's noncompliance with other medical treatment and regimen: Secondary | ICD-10-CM | POA: Diagnosis not present

## 2018-07-11 DIAGNOSIS — Z888 Allergy status to other drugs, medicaments and biological substances status: Secondary | ICD-10-CM

## 2018-07-11 DIAGNOSIS — Z886 Allergy status to analgesic agent status: Secondary | ICD-10-CM | POA: Diagnosis not present

## 2018-07-11 DIAGNOSIS — Z6841 Body Mass Index (BMI) 40.0 and over, adult: Secondary | ICD-10-CM

## 2018-07-11 DIAGNOSIS — E039 Hypothyroidism, unspecified: Secondary | ICD-10-CM | POA: Diagnosis present

## 2018-07-11 DIAGNOSIS — N183 Chronic kidney disease, stage 3 (moderate): Secondary | ICD-10-CM | POA: Diagnosis present

## 2018-07-11 DIAGNOSIS — G4733 Obstructive sleep apnea (adult) (pediatric): Secondary | ICD-10-CM | POA: Diagnosis present

## 2018-07-11 DIAGNOSIS — I160 Hypertensive urgency: Secondary | ICD-10-CM

## 2018-07-11 DIAGNOSIS — I351 Nonrheumatic aortic (valve) insufficiency: Secondary | ICD-10-CM | POA: Diagnosis not present

## 2018-07-11 DIAGNOSIS — I5033 Acute on chronic diastolic (congestive) heart failure: Secondary | ICD-10-CM | POA: Diagnosis present

## 2018-07-11 DIAGNOSIS — Z91048 Other nonmedicinal substance allergy status: Secondary | ICD-10-CM

## 2018-07-11 DIAGNOSIS — J189 Pneumonia, unspecified organism: Secondary | ICD-10-CM | POA: Diagnosis present

## 2018-07-11 DIAGNOSIS — Z79890 Hormone replacement therapy (postmenopausal): Secondary | ICD-10-CM | POA: Diagnosis not present

## 2018-07-11 DIAGNOSIS — Z885 Allergy status to narcotic agent status: Secondary | ICD-10-CM

## 2018-07-11 DIAGNOSIS — Z880 Allergy status to penicillin: Secondary | ICD-10-CM

## 2018-07-11 DIAGNOSIS — I130 Hypertensive heart and chronic kidney disease with heart failure and stage 1 through stage 4 chronic kidney disease, or unspecified chronic kidney disease: Secondary | ICD-10-CM | POA: Diagnosis present

## 2018-07-11 DIAGNOSIS — Z8249 Family history of ischemic heart disease and other diseases of the circulatory system: Secondary | ICD-10-CM

## 2018-07-11 LAB — COMPREHENSIVE METABOLIC PANEL
ALT: 16 U/L (ref 0–44)
AST: 19 U/L (ref 15–41)
Albumin: 4.3 g/dL (ref 3.5–5.0)
Alkaline Phosphatase: 60 U/L (ref 38–126)
Anion gap: 7 (ref 5–15)
BILIRUBIN TOTAL: 0.7 mg/dL (ref 0.3–1.2)
BUN: 20 mg/dL (ref 6–20)
CHLORIDE: 109 mmol/L (ref 98–111)
CO2: 25 mmol/L (ref 22–32)
Calcium: 9.2 mg/dL (ref 8.9–10.3)
Creatinine, Ser: 1.59 mg/dL — ABNORMAL HIGH (ref 0.44–1.00)
GFR, EST AFRICAN AMERICAN: 46 mL/min — AB (ref >60)
GFR, EST NON AFRICAN AMERICAN: 40 mL/min — AB (ref >60)
Glucose, Bld: 103 mg/dL — ABNORMAL HIGH (ref 70–99)
Potassium: 3.5 mmol/L (ref 3.5–5.1)
Sodium: 141 mmol/L (ref 135–145)
Total Protein: 8 g/dL (ref 6.5–8.1)

## 2018-07-11 LAB — CBC WITH DIFFERENTIAL/PLATELET
Abs Immature Granulocytes: 0.03 10*3/uL (ref 0.00–0.07)
Basophils Absolute: 0 10*3/uL (ref 0.0–0.1)
Basophils Relative: 0 %
Eosinophils Absolute: 0 10*3/uL (ref 0.0–0.5)
Eosinophils Relative: 0 %
HCT: 41.5 % (ref 36.0–46.0)
Hemoglobin: 12.5 g/dL (ref 12.0–15.0)
Immature Granulocytes: 0 %
LYMPHS PCT: 11 %
Lymphs Abs: 1.1 10*3/uL (ref 0.7–4.0)
MCH: 28.8 pg (ref 26.0–34.0)
MCHC: 30.1 g/dL (ref 30.0–36.0)
MCV: 95.6 fL (ref 80.0–100.0)
Monocytes Absolute: 0.3 10*3/uL (ref 0.1–1.0)
Monocytes Relative: 3 %
NEUTROS ABS: 8.8 10*3/uL — AB (ref 1.7–7.7)
Neutrophils Relative %: 86 %
Platelets: 217 10*3/uL (ref 150–400)
RBC: 4.34 MIL/uL (ref 3.87–5.11)
RDW: 14.1 % (ref 11.5–15.5)
WBC: 10.3 10*3/uL (ref 4.0–10.5)
nRBC: 0 % (ref 0.0–0.2)

## 2018-07-11 LAB — TSH: TSH: 1.987 u[IU]/mL (ref 0.350–4.500)

## 2018-07-11 LAB — TROPONIN I
Troponin I: 0.03 ng/mL (ref <0.03)
Troponin I: 0.03 ng/mL (ref <0.03)

## 2018-07-11 LAB — URINALYSIS, ROUTINE W REFLEX MICROSCOPIC
Bilirubin Urine: NEGATIVE
Glucose, UA: NEGATIVE mg/dL
Ketones, ur: NEGATIVE mg/dL
Leukocytes, UA: NEGATIVE
NITRITE: NEGATIVE
PH: 6.5 (ref 5.0–8.0)
Protein, ur: NEGATIVE mg/dL
Specific Gravity, Urine: <1.005 — ABNORMAL LOW (ref 1.005–1.030)

## 2018-07-11 LAB — URINALYSIS, MICROSCOPIC (REFLEX)
RBC / HPF: NONE SEEN RBC/hpf (ref 0–5)
WBC, UA: NONE SEEN WBC/hpf (ref 0–5)

## 2018-07-11 LAB — D-DIMER, QUANTITATIVE: D-Dimer, Quant: 1.62 ug/mL-FEU — ABNORMAL HIGH (ref 0.00–0.50)

## 2018-07-11 LAB — HCG, QUANTITATIVE, PREGNANCY: hCG, Beta Chain, Quant, S: 12 m[IU]/mL — ABNORMAL HIGH (ref <5)

## 2018-07-11 LAB — TYPE AND SCREEN
ABO/RH(D): A POS
Antibody Screen: NEGATIVE

## 2018-07-11 LAB — CBC
HCT: 37 % (ref 36.0–46.0)
Hemoglobin: 11.5 g/dL — ABNORMAL LOW (ref 12.0–15.0)
MCH: 29 pg (ref 26.0–34.0)
MCHC: 31.1 g/dL (ref 30.0–36.0)
MCV: 93.2 fL (ref 80.0–100.0)
PLATELETS: 201 10*3/uL (ref 150–400)
RBC: 3.97 MIL/uL (ref 3.87–5.11)
RDW: 13.9 % (ref 11.5–15.5)
WBC: 8.7 10*3/uL (ref 4.0–10.5)
nRBC: 0 % (ref 0.0–0.2)

## 2018-07-11 LAB — BRAIN NATRIURETIC PEPTIDE: B NATRIURETIC PEPTIDE 5: 282.5 pg/mL — AB (ref 0.0–100.0)

## 2018-07-11 LAB — SEDIMENTATION RATE: Sed Rate: 19 mm/hr (ref 0–22)

## 2018-07-11 MED ORDER — ONDANSETRON HCL 4 MG/2ML IJ SOLN: 4.0000 mg | Freq: Four times a day (QID) | INTRAMUSCULAR | Status: DC | PRN

## 2018-07-11 MED ORDER — HYDROCHLOROTHIAZIDE 25 MG PO TABS
25.0000 mg | ORAL_TABLET | Freq: Every day | ORAL | Status: DC
  Administered 2018-07-11: 25 mg via ORAL
  Filled 2018-07-11 (×2): qty 1

## 2018-07-11 MED ORDER — LABETALOL HCL 200 MG PO TABS
200.0000 mg | ORAL_TABLET | Freq: Two times a day (BID) | ORAL | Status: DC
  Administered 2018-07-11 – 2018-07-12 (×2): 200 mg via ORAL
  Filled 2018-07-11 (×3): qty 1

## 2018-07-11 MED ORDER — ALBUTEROL SULFATE (2.5 MG/3ML) 0.083% IN NEBU: 2.5000 mg | INHALATION_SOLUTION | RESPIRATORY_TRACT | Status: DC | PRN

## 2018-07-11 MED ORDER — SODIUM CHLORIDE 0.9 % IV SOLN
1.0000 g | INTRAVENOUS | Status: DC
  Administered 2018-07-12: 1 g via INTRAVENOUS
  Filled 2018-07-11: qty 10

## 2018-07-11 MED ORDER — CLONIDINE HCL 0.2 MG PO TABS
0.2000 mg | ORAL_TABLET | Freq: Two times a day (BID) | ORAL | Status: DC
  Administered 2018-07-11: 0.2 mg via ORAL
  Filled 2018-07-11: qty 1

## 2018-07-11 MED ORDER — LEVOTHYROXINE SODIUM 75 MCG PO TABS
75.0000 ug | ORAL_TABLET | Freq: Every day | ORAL | Status: DC
  Administered 2018-07-11: 75 ug via ORAL
  Filled 2018-07-11: qty 3

## 2018-07-11 MED ORDER — ACETAMINOPHEN 500 MG PO TABS
1000.0000 mg | ORAL_TABLET | Freq: Once | ORAL | Status: AC
  Administered 2018-07-11: 1000 mg via ORAL
  Filled 2018-07-11: qty 2

## 2018-07-11 MED ORDER — SODIUM CHLORIDE 0.9 % IV SOLN
1.0000 g | Freq: Once | INTRAVENOUS | Status: AC
  Administered 2018-07-11: 1 g via INTRAVENOUS
  Filled 2018-07-11: qty 10

## 2018-07-11 MED ORDER — ACETAMINOPHEN 650 MG RE SUPP: 650.0000 mg | Freq: Four times a day (QID) | RECTAL | Status: DC | PRN

## 2018-07-11 MED ORDER — IOPAMIDOL (ISOVUE-370) INJECTION 76%
100.0000 mL | Freq: Once | INTRAVENOUS | Status: AC | PRN
  Administered 2018-07-11: 100 mL via INTRAVENOUS

## 2018-07-11 MED ORDER — LEVOTHYROXINE SODIUM 75 MCG PO TABS
75.0000 ug | ORAL_TABLET | Freq: Every day | ORAL | Status: DC
  Administered 2018-07-12 – 2018-07-14 (×3): 75 ug via ORAL
  Filled 2018-07-11 (×3): qty 1

## 2018-07-11 MED ORDER — LOSARTAN POTASSIUM 50 MG PO TABS
100.0000 mg | ORAL_TABLET | Freq: Every day | ORAL | Status: DC
  Administered 2018-07-11 – 2018-07-12 (×2): 100 mg via ORAL
  Filled 2018-07-11: qty 2
  Filled 2018-07-11: qty 4

## 2018-07-11 MED ORDER — DOXYCYCLINE HYCLATE 100 MG PO TABS
100.0000 mg | ORAL_TABLET | Freq: Once | ORAL | Status: AC
  Administered 2018-07-11: 100 mg via ORAL
  Filled 2018-07-11: qty 1

## 2018-07-11 MED ORDER — LABETALOL HCL 5 MG/ML IV SOLN
10.0000 mg | Freq: Once | INTRAVENOUS | Status: AC
  Administered 2018-07-11: 10 mg via INTRAVENOUS
  Filled 2018-07-11: qty 4

## 2018-07-11 MED ORDER — ONDANSETRON HCL 4 MG PO TABS: 4.0000 mg | ORAL_TABLET | Freq: Four times a day (QID) | ORAL | Status: DC | PRN

## 2018-07-11 MED ORDER — LABETALOL HCL 5 MG/ML IV SOLN
5.0000 mg | Freq: Once | INTRAVENOUS | Status: AC
Start: 2018-07-11 — End: 2018-07-11
  Administered 2018-07-11: 5 mg via INTRAVENOUS
  Filled 2018-07-11: qty 4

## 2018-07-11 MED ORDER — ACETAMINOPHEN 325 MG PO TABS
650.0000 mg | ORAL_TABLET | Freq: Four times a day (QID) | ORAL | Status: DC | PRN
  Administered 2018-07-12: 650 mg via ORAL
  Filled 2018-07-11: qty 2

## 2018-07-11 MED ORDER — AMLODIPINE BESYLATE 10 MG PO TABS
10.0000 mg | ORAL_TABLET | Freq: Every day | ORAL | Status: DC
  Administered 2018-07-11 – 2018-07-14 (×4): 10 mg via ORAL
  Filled 2018-07-11 (×3): qty 1
  Filled 2018-07-11: qty 2

## 2018-07-11 NOTE — ED Triage Notes (Signed)
Pt states she feels sob, congested, headache.  Pt states she has pink tinged sputum.  Pt has not been taking her BP medication.

## 2018-07-11 NOTE — Progress Notes (Signed)
Pt arrived on stretcher via [facility] to 2W11 on 2L [place] & 1 saline locked PIV. Pt reports no pain. Pt ambulated from stretcher to bed without complication. Care assumed at this time.

## 2018-07-11 NOTE — Progress Notes (Signed)
Admission from [facility] 42 y/o F with h/o HTN presented to [facility] with sudden onset SOB, pink sputum, mild cough. Hypoxic requiring 2L O2. Afebrile. Systolic BP in the 404B. BNP in 200s, TnI 0.03, repeat pending. CXR showed possible PNA, CT showed multifocal PNA and evidence of pulm HTN. Given rocephin and doxy, 2 doses of labetalol. Baseline CKD 3. Admit for acute hypoxic resp failure 2/2 CAP and possible pulm edema.

## 2018-07-11 NOTE — ED Notes (Signed)
Pt given water and snack per request

## 2018-07-11 NOTE — ED Provider Notes (Signed)
[place] EMERGENCY DEPARTMENT Provider Note   CSN: [id number] Arrival date & time: 07/11/18  8638     History   Chief Complaint Chief Complaint  Patient presents with  . Shortness of Breath    HPI Dawn Thomas is a 42 y.o. female.  42 year old female with past medical history including asthma, hypertension, migraines, OSA, hypothyroidism, morbid obesity who presents with shortness of breath.  Patient states she woke up this morning feeling severely short of breath.  She used several albuterol breathing treatments which did seem to help her symptoms.  Shortness of breath is worse when she lays flat.  She has had a cough productive of some pink-tinged sputum today.  She states that she felt fine yesterday.  She denies any leg swelling, chest pain, fevers, sore throat, dysuria, or vomiting.  She states that she has had increased urination recently.  She has not had her amlodipine in several days but states that she has been taking her other blood pressure medications.  No recent travel, history of blood clots, estrogen use, or history of cancer.  Denies any history of CHF.  The history is provided by the patient.    Past Medical History:  Diagnosis Date  . Asthma   . Complication of anesthesia    woke up during surgery   . Hypertension   . Hypothyroidism   . Migraines   . Renal disorder    stage 3- Dr [facility] - Dr Olivia Mackie  . Renal insufficiency   . Sleep apnea    mild   . Tendonitis   . Thyroid disease     Patient Active Problem List   Diagnosis Date Noted  . Acute kidney injury ([place]) 05/31/2017  . Bradycardia 05/31/2017  . Morbid obesity ([place]) 05/30/2017  . Right ankle pain 01/18/2012    Past Surgical History:  Procedure Laterality Date  . CESAREAN SECTION    . CHOLECYSTECTOMY    . DILATION AND CURETTAGE OF UTERUS    . LAPAROSCOPIC GASTRIC SLEEVE RESECTION N/A 05/30/2017   Procedure: LAPAROSCOPIC GASTRIC SLEEVE RESECTION WITH UPPPER ENDO AND  HIATAL HERNIA REPAIR;  Surgeon: Kieth Brightly, Arta Bruce, MD;  Location: [facility];  Service: General;  Laterality: N/A;     OB History   None      Home Medications    Prior to Admission medications   Medication Sig Start Date End Date Taking? Authorizing Provider  albuterol (PROVENTIL HFA;VENTOLIN HFA) 108 (90 BASE) MCG/ACT inhaler Inhale 2 puffs into the lungs every 6 (six) hours as needed for wheezing or shortness of breath.     [provider]  amLODipine (NORVASC) 10 MG tablet Take 10 mg by mouth daily.    [provider]  cloNIDine (CATAPRES) 0.2 MG tablet Take 0.2 mg by mouth 2 (two) times daily.    [provider]  labetalol (NORMODYNE) 200 MG tablet Take 200 mg by mouth 2 (two) times daily. 04/23/18   [provider]  levothyroxine (SYNTHROID, LEVOTHROID) 75 MCG tablet Take 75 mcg by mouth daily.     [provider]  losartan-hydrochlorothiazide (HYZAAR) 100-25 MG tablet Take 1 tablet by mouth daily.      [provider]    Family History Family History  Problem Relation Age of Onset  . Diabetes Mother   . Hypertension Mother   . Hypertension Father   . Diabetes Father   . Sudden death Father   . Heart attack Neg Hx   . Hyperlipidemia Neg  Hx     Social History Social History   Tobacco Use  . Smoking status: Never Smoker  . Smokeless tobacco: Never Used  Substance Use Topics  . Alcohol use: No  . Drug use: No     Allergies   Maxalt [rizatriptan benzoate]; Nsaids; Omeprazole; Adhesive [tape]; Hydrocodone; Penicillins; and Zithromax [azithromycin]   Review of Systems Review of Systems All other systems reviewed and are negative except that which was mentioned in HPI   Physical Exam Updated Vital Signs BP (!) 205/99   Pulse 63   Temp 98.3 F (36.8 C) (Oral)   Resp 18   Ht 5' 6.5" (1.689 m)   Wt 124.7 kg   LMP 07/02/2018   SpO2 100%   BMI 43.72 kg/m   Physical Exam  Constitutional: She is oriented  to person, place, and time. She appears well-developed and well-nourished. No distress.  HENT:  Head: Normocephalic and atraumatic.  Moist mucous membranes  Eyes: Conjunctivae are normal.  Neck: Neck supple.  Cardiovascular: Normal rate, regular rhythm and normal heart sounds.  No murmur heard. Pulmonary/Chest: She has no wheezes.  Very mild tachypnea without respiratory distress, speaking in full sentences; faint crackles in bases; diminished in LUL  Abdominal: Soft. Bowel sounds are normal. She exhibits no distension. There is no tenderness.  Musculoskeletal:       Right lower leg: She exhibits edema.       Left lower leg: She exhibits edema.  Trace BLE edema  Neurological: She is alert and oriented to person, place, and time.  Fluent speech  Skin: Skin is warm and dry.  Psychiatric: She has a normal mood and affect. Judgment normal.  Nursing note and vitals reviewed.    ED Treatments / Results  Labs (all labs ordered are listed, but only abnormal results are displayed) Labs Reviewed  COMPREHENSIVE METABOLIC PANEL - Abnormal; Notable for the following components:      Result Value   Glucose, Bld 103 (*)    Creatinine, Ser 1.59 (*)    GFR calc non Af Amer 40 (*)    GFR calc Af Amer 46 (*)    All other components within normal limits  CBC WITH DIFFERENTIAL/PLATELET - Abnormal; Notable for the following components:   Neutro Abs 8.8 (*)    All other components within normal limits  BRAIN NATRIURETIC PEPTIDE - Abnormal; Notable for the following components:   B Natriuretic Peptide 282.5 (*)    All other components within normal limits  TROPONIN I - Abnormal; Notable for the following components:   Troponin I 0.03 (*)    All other components within normal limits  URINALYSIS, ROUTINE W REFLEX MICROSCOPIC - Abnormal; Notable for the following components:   Specific Gravity, Urine <1.005 (*)    Hgb urine dipstick SMALL (*)    All other components within normal limits  HCG,  QUANTITATIVE, PREGNANCY - Abnormal; Notable for the following components:   hCG, Beta Chain, Quant, S 12 (*)    All other components within normal limits  URINALYSIS, MICROSCOPIC (REFLEX) - Abnormal; Notable for the following components:   Bacteria, UA RARE (*)    All other components within normal limits  D-DIMER, QUANTITATIVE (NOT AT [facility]) - Abnormal; Notable for the following components:   D-Dimer, Quant 1.62 (*)    All other components within normal limits  TROPONIN I - Abnormal; Notable for the following components:   Troponin I 0.03 (*)    All other components within normal limits  EKG EKG Interpretation  Date/Time:  Wednesday July 11 2018 09:15:16 EST Ventricular Rate:  78 PR Interval:    QRS Duration: 103 QT Interval:  417 QTC Calculation: 475 R Axis:   -50 Text Interpretation:  Sinus rhythm Left anterior fascicular block Probable left ventricular hypertrophy rate faster than previous, previous inferior T wave inversions have improved Confirmed by Theotis Burrow [phone]) on 07/11/2018 9:17:33 AM   Radiology Dg Chest 2 View  Result Date: 07/11/2018 CLINICAL DATA:  Shortness of breath.  Cough. EXAM: CHEST - 2 VIEW COMPARISON:  May 17, 2017 FINDINGS: There is patchy airspace consolidation in the right lower lobe. The lungs elsewhere are clear. Heart is borderline enlarged with pulmonary vascularity within normal limits. No adenopathy. There is degenerative change in the lower thoracic spine. IMPRESSION: Airspace opacity consistent with pneumonia right base region. Lungs elsewhere clear. Heart mildly enlarged with pulmonary vascularity normal. Electronically Signed   By: Lowella Grip III M.D.   On: 07/11/2018 09:13   Ct Angio Chest Pe W/cm &/or Wo Cm  Result Date: 07/11/2018 CLINICAL DATA:  Shortness of breath and elevated D-dimer EXAM: CT ANGIOGRAPHY CHEST WITH CONTRAST TECHNIQUE: Multidetector CT imaging of the chest was performed using the standard protocol  during bolus administration of intravenous contrast. Multiplanar CT image reconstructions and MIPs were obtained to evaluate the vascular anatomy. CONTRAST:  170mL ISOVUE-370 IOPAMIDOL (ISOVUE-370) INJECTION 76% COMPARISON:  Chest radiograph July 11, 2018 FINDINGS: Cardiovascular: There is no demonstrable pulmonary embolus. There is no thoracic aortic aneurysm or dissection. The visualized great vessels appear normal. There is no pericardial effusion or pericardial thickening. There is a degree of left ventricular hypertrophy. Prominence of the main pulmonary outflow tract is noted, measuring 3.5 cm in diameter. Mediastinum/Nodes: Visualized thyroid appears unremarkable. There is no appreciable thoracic adenopathy. No esophageal lesions are evident. Lungs/Pleura: There is airspace consolidation throughout portions of each upper and lower lobe region. There is also patchy infiltrate in the inferior lingula. No pleural effusion evident. No appreciable interstitial edema. On axial slice 40 series 6, there is a 5 mm nodular opacity in the medial segment of the right middle lobe. Upper Abdomen: Visualized upper abdominal structures appear normal except for evidence of previous gastric bypass procedure, incompletely visualized. Musculoskeletal: There are foci of degenerative change in the thoracic spine. There is thoracic dextroscoliosis. There are no blastic or lytic bone lesions. No chest wall lesions are demonstrable. Review of the MIP images confirms the above findings. IMPRESSION: 1. No demonstrable pulmonary embolus. No thoracic aortic aneurysm or dissection. 2. Prominence of the main pulmonary outflow tract, a finding indicative of a degree of pulmonary arterial hypertension. 3. Multifocal airspace consolidation bilaterally involving multiple lobes in segments. Suspect widespread pneumonia. Diffuse pulmonary hemorrhage could present in this manner and is a differential consideration based on imaging appearance.  Allergic type phenomenon with alveolar edema also is a differential consideration. 4. 5 mm nodular opacity right middle lobe. No follow-up needed if patient is low-risk. Non-contrast chest CT can be considered in 12 months if patient is high-risk. This recommendation follows the consensus statement: Guidelines for Management of Incidental Pulmonary Nodules Detected on CT Images: From the Fleischner Society 2017; Radiology 2017; 284:228-243. 5.  No appreciable thoracic adenopathy. 6.  Left ventricular hypertrophy. 7.  Status post gastric bypass procedure. Electronically Signed   By: Lowella Grip III M.D.   On: 07/11/2018 13:39    Procedures Procedures (including critical care time)  Medications Ordered in ED Medications  acetaminophen (TYLENOL) tablet  1,000 mg (1,000 mg Oral Given 07/11/18 1032)  labetalol (NORMODYNE,TRANDATE) injection 5 mg (5 mg Intravenous Given 07/11/18 1130)  iopamidol (ISOVUE-370) 76 % injection 100 mL (100 mLs Intravenous Contrast Given 07/11/18 1301)  labetalol (NORMODYNE,TRANDATE) injection 10 mg (10 mg Intravenous Given 07/11/18 1350)  cefTRIAXone (ROCEPHIN) 1 g in sodium chloride 0.9 % 100 mL IVPB (1 g Intravenous New Bag/Given 07/11/18 1514)  doxycycline (VIBRA-TABS) tablet 100 mg (100 mg Oral Given 07/11/18 1514)     Initial Impression / Assessment and Plan / ED Course  I have reviewed the triage vital signs and the nursing notes.  Pertinent labs & imaging results that were available during my care of the patient were reviewed by me and considered in my medical decision making (see chart for details).      Pt hypoxic initially, placed on 3L [place], no respiratory distress on my exam. No wheezing, doubt asthma exacerbation. Concern for pulm edema based on symptoms. Gave labetalol for persistent severe HTN.   Her chart shows she had miscarriage in Oct and never followed up for repeat US/ HCG. HCG today is 12 which will need f/u for repeat US by OBGYN but I feel  it represents falling HCG from miscarriage.  Labs show Cr 1.6, trop 0.03, BNP 282, D dimer 1.6. CXR w/ probable RLL pneumonia. Given report of sudden onset of symptoms and positive d-dimer, obtained CTA chest.  CTA negative for PE but does confirm multifocal pneumonia.  Differential also includes pulmonary hemorrhage or alveolar edema.  I am concerned about the possibility of CHF and feel the patient would also benefit from echo given her severe hypertension, kidney disease, and borderline elevated troponin.  Gave the patient ceftriaxone and doxycycline.  Discussed admission with [facility] hospitalist, Dr. Steffanie Dunn. Pt will be [facility] to [facility] when bed becomes available.  Final Clinical Impressions(s) / ED Diagnoses   Final diagnoses:  Multifocal pneumonia  Hypertensive urgency    ED Discharge Orders    None       Angeleen Horney, Wenda Overland, MD 07/11/18 1549

## 2018-07-11 NOTE — Progress Notes (Signed)
Pt easily awakened and refused to have arterial blood gas drawn.  MD made aware.  Pt in no distress at time of check

## 2018-07-11 NOTE — H&P (Addendum)
History and Physical    Dawn Thomas YTK:[id number] DOB: 1975-11-07 DOA: 07/11/2018  PCP: Virginia Rochester, PA  Patient coming from: Home.  Chief Complaint: Shortness of breath.  HPI: Dawn Thomas is a 42 y.o. female with history of hypertension, asthma, hypothyroidism, chronic kidney disease stage III, previous gastric bypass presents to the ER at [facility] with complaints of shortness of breath.  Patient has been having the symptoms for last 24 to 48 hours.  Denies any chest pain.  Has been having productive cough with pink frothy sputum.  Since the symptoms persisted patient came to the ER.  Patient has not taken her antihypertensives for last 4 days.  Denies any recent travel or sick contacts.  ED Course: In the ER patient is found to be having markedly elevated blood pressure for which patient was given her home medications.  CT scan of the chest shows multifocal pneumonia including differentials were diffuse alveolar hemorrhage or any allergic process.  There also was a lung nodule.  Patient was started on empiric antibiotics for pneumonia and admitted for further work-up.  At the time of my exam patient appears drowsy but arousable and answers questions.  Declined ABG.  Review of Systems: As per HPI, rest all negative.   Past Medical History:  Diagnosis Date  . Asthma   . Complication of anesthesia    woke up during surgery   . Hypertension   . Hypothyroidism   . Migraines   . Renal disorder    stage 3- [facility] - Dr Olivia Mackie  . Renal insufficiency   . Sleep apnea    mild   . Tendonitis   . Thyroid disease     Past Surgical History:  Procedure Laterality Date  . CESAREAN SECTION    . CHOLECYSTECTOMY    . DILATION AND CURETTAGE OF UTERUS    . LAPAROSCOPIC GASTRIC SLEEVE RESECTION N/A 05/30/2017   Procedure: LAPAROSCOPIC GASTRIC SLEEVE RESECTION WITH UPPPER ENDO AND HIATAL HERNIA REPAIR;  Surgeon: Kieth Brightly, Arta Bruce, MD;  Location: [facility];  Service: General;   Laterality: N/A;     reports that she has never smoked. She has never used smokeless tobacco. She reports that she does not drink alcohol or use drugs.  Allergies  Allergen Reactions  . Maxalt [Rizatriptan Benzoate] Shortness Of Breath  . Nsaids Other (See Comments)    Renal insufficiency.  . Omeprazole Hives  . Adhesive [Tape] Other (See Comments)    Burns skin  . Hydrocodone Hives  . Penicillins Hives  . Zithromax [Azithromycin] Other (See Comments)    "messes with my breathing"    Family History  Problem Relation Age of Onset  . Diabetes Mother   . Hypertension Mother   . Hypertension Father   . Diabetes Father   . Sudden death Father   . Heart attack Neg Hx   . Hyperlipidemia Neg Hx     Prior to Admission medications   Medication Sig Start Date End Date Taking? Authorizing Provider  albuterol (PROVENTIL HFA;VENTOLIN HFA) 108 (90 BASE) MCG/ACT inhaler Inhale 2 puffs into the lungs every 6 (six) hours as needed for wheezing or shortness of breath.     [provider]  amLODipine (NORVASC) 10 MG tablet Take 10 mg by mouth daily.    [provider]  cloNIDine (CATAPRES) 0.2 MG tablet Take 0.2 mg by mouth 2 (two) times daily.    [provider]  labetalol (NORMODYNE) 200 MG tablet Take 200 mg by  mouth 2 (two) times daily. 04/23/18   [provider]  levothyroxine (SYNTHROID, LEVOTHROID) 75 MCG tablet Take 75 mcg by mouth daily.     [provider]  losartan-hydrochlorothiazide (HYZAAR) 100-25 MG tablet Take 1 tablet by mouth daily.      [provider]    Physical Exam: Vitals:   07/11/18 1915 07/11/18 1930 07/11/18 2000 07/11/18 2116  BP: (!) 179/87  (!) 188/98 (!) 190/101  Pulse: 72 60 (!) 57 62  Resp: 18 15 17 20   Temp:    98.3 F (36.8 C)  TempSrc:    Oral  SpO2: 100% 100% 100% 100%  Weight:      Height:          Constitutional: Moderately built and nourished. Vitals:   07/11/18 1915 07/11/18 1930  07/11/18 2000 07/11/18 2116  BP: (!) 179/87  (!) 188/98 (!) 190/101  Pulse: 72 60 (!) 57 62  Resp: 18 15 17 20   Temp:    98.3 F (36.8 C)  TempSrc:    Oral  SpO2: 100% 100% 100% 100%  Weight:      Height:       Eyes: Anicteric no pallor. ENMT: No discharge from the ears eyes nose or mouth. Neck: No mass or.  No neck rigidity. Respiratory: No rhonchi or crepitations. Cardiovascular: S1-S2 heard. Abdomen: Soft nontender bowel sounds present. Musculoskeletal: No edema.  No joint effusion. Skin: No rash. Neurologic: Mildly drowsy but easily arousable answers questions appropriate and is oriented to time place and person.  Moves all extremities. Psychiatric: Appears normal.   Labs on Admission: I have personally reviewed following labs and imaging studies  CBC: Recent Labs  Lab 07/11/18 0915  WBC 10.3  NEUTROABS 8.8*  HGB 12.5  HCT 41.5  MCV 95.6  PLT 588   Basic Metabolic Panel: Recent Labs  Lab 07/11/18 0915  NA 141  K 3.5  CL 109  CO2 25  GLUCOSE 103*  BUN 20  CREATININE 1.59*  CALCIUM 9.2   GFR: Estimated Creatinine Clearance: 62.7 mL/min (A) (by C-G formula based on SCr of 1.59 mg/dL (H)). Liver Function Tests: Recent Labs  Lab 07/11/18 0915  AST 19  ALT 16  ALKPHOS 60  BILITOT 0.7  PROT 8.0  ALBUMIN 4.3   No results for input(s): LIPASE, AMYLASE in the last 168 hours. No results for input(s): AMMONIA in the last 168 hours. Coagulation Profile: No results for input(s): INR, PROTIME in the last 168 hours. Cardiac Enzymes: Recent Labs  Lab 07/11/18 0915 07/11/18 1349  TROPONINI 0.03* 0.03*   BNP (last 3 results) No results for input(s): PROBNP in the last 8760 hours. HbA1C: No results for input(s): HGBA1C in the last 72 hours. CBG: No results for input(s): GLUCAP in the last 168 hours. Lipid Profile: No results for input(s): CHOL, HDL, LDLCALC, TRIG, CHOLHDL, LDLDIRECT in the last 72 hours. Thyroid Function Tests: No results for  input(s): TSH, T4TOTAL, FREET4, T3FREE, THYROIDAB in the last 72 hours. Anemia Panel: No results for input(s): VITAMINB12, FOLATE, FERRITIN, TIBC, IRON, RETICCTPCT in the last 72 hours. Urine analysis:    Component Value Date/Time   COLORURINE YELLOW 07/11/2018 0842   APPEARANCEUR CLEAR 07/11/2018 0842   LABSPEC <1.005 (L) 07/11/2018 0842   PHURINE 6.5 07/11/2018 0842   GLUCOSEU NEGATIVE 07/11/2018 0842   HGBUR SMALL (A) 07/11/2018 0842   BILIRUBINUR NEGATIVE 07/11/2018 0842   KETONESUR NEGATIVE 07/11/2018 0842   PROTEINUR NEGATIVE 07/11/2018 0842   UROBILINOGEN 1.0  10/04/2013 2130   NITRITE NEGATIVE 07/11/2018 0842   LEUKOCYTESUR NEGATIVE 07/11/2018 0842   Sepsis Labs: @LABRCNTIP (procalcitonin:4,lacticidven:4) )No results found for this or any previous visit (from the past 240 hour(s)).   Radiological Exams on Admission: Dg Chest 2 View  Result Date: 07/11/2018 CLINICAL DATA:  Shortness of breath.  Cough. EXAM: CHEST - 2 VIEW COMPARISON:  May 17, 2017 FINDINGS: There is patchy airspace consolidation in the right lower lobe. The lungs elsewhere are clear. Heart is borderline enlarged with pulmonary vascularity within normal limits. No adenopathy. There is degenerative change in the lower thoracic spine. IMPRESSION: Airspace opacity consistent with pneumonia right base region. Lungs elsewhere clear. Heart mildly enlarged with pulmonary vascularity normal. Electronically Signed   By: Lowella Grip III M.D.   On: 07/11/2018 09:13   Ct Angio Chest Pe W/cm &/or Wo Cm  Result Date: 07/11/2018 CLINICAL DATA:  Shortness of breath and elevated D-dimer EXAM: CT ANGIOGRAPHY CHEST WITH CONTRAST TECHNIQUE: Multidetector CT imaging of the chest was performed using the standard protocol during bolus administration of intravenous contrast. Multiplanar CT image reconstructions and MIPs were obtained to evaluate the vascular anatomy. CONTRAST:  178mL ISOVUE-370 IOPAMIDOL (ISOVUE-370) INJECTION  76% COMPARISON:  Chest radiograph July 11, 2018 FINDINGS: Cardiovascular: There is no demonstrable pulmonary embolus. There is no thoracic aortic aneurysm or dissection. The visualized great vessels appear normal. There is no pericardial effusion or pericardial thickening. There is a degree of left ventricular hypertrophy. Prominence of the main pulmonary outflow tract is noted, measuring 3.5 cm in diameter. Mediastinum/Nodes: Visualized thyroid appears unremarkable. There is no appreciable thoracic adenopathy. No esophageal lesions are evident. Lungs/Pleura: There is airspace consolidation throughout portions of each upper and lower lobe region. There is also patchy infiltrate in the inferior lingula. No pleural effusion evident. No appreciable interstitial edema. On axial slice 40 series 6, there is a 5 mm nodular opacity in the medial segment of the right middle lobe. Upper Abdomen: Visualized upper abdominal structures appear normal except for evidence of previous gastric bypass procedure, incompletely visualized. Musculoskeletal: There are foci of degenerative change in the thoracic spine. There is thoracic dextroscoliosis. There are no blastic or lytic bone lesions. No chest wall lesions are demonstrable. Review of the MIP images confirms the above findings. IMPRESSION: 1. No demonstrable pulmonary embolus. No thoracic aortic aneurysm or dissection. 2. Prominence of the main pulmonary outflow tract, a finding indicative of a degree of pulmonary arterial hypertension. 3. Multifocal airspace consolidation bilaterally involving multiple lobes in segments. Suspect widespread pneumonia. Diffuse pulmonary hemorrhage could present in this manner and is a differential consideration based on imaging appearance. Allergic type phenomenon with alveolar edema also is a differential consideration. 4. 5 mm nodular opacity right middle lobe. No follow-up needed if patient is low-risk. Non-contrast chest CT can be  considered in 12 months if patient is high-risk. This recommendation follows the consensus statement: Guidelines for Management of Incidental Pulmonary Nodules Detected on CT Images: From the Fleischner Society 2017; Radiology 2017; 284:228-243. 5.  No appreciable thoracic adenopathy. 6.  Left ventricular hypertrophy. 7.  Status post gastric bypass procedure. Electronically Signed   By: Lowella Grip III M.D.   On: 07/11/2018 13:39    EKG: Independently reviewed.  Normal sinus rhythm.  Assessment/Plan Principal Problem:   Acute respiratory failure with hypoxia (HCC) Active Problems:   CAP (community acquired pneumonia)   Multifocal pneumonia   Hypertensive urgency   Hypothyroidism    1. Acute respiratory failure with hypoxia differentials include  community-acquired pneumonia and others include diffuse pulmonary hemorrhage.  For now patient is empiric antibiotics.  Follow cultures sputum cultures follow CBC check sed rate cycle cardiac markers.  If patient continues to cough up pink sputum or any hemoptysis will consult pulmonary.  Repeat chest x-ray in the morning.  Check 2D echo. 2. Hypertensive urgency contributing to #1.  Patient has been restarted on her home medications.  Will keep patient on PRN IV hydralazine addition.  Patient is on losartan, hydrochlorothiazide labetalol, clonidine, amlodipine. 3. Elevated troponin with CAT scan showing possible pulmonary hypertension we will cycle cardiac markers check 2D echo. 4. Chronic kidney disease stage III with creatinine actually better than previously.  Follow metabolic panel closely. 5. Patient is mildly lethargic.  Patient is declining ABG.  Closely monitor. 6. Hypothyroidism on Synthroid. 7. History of gastric bypass.  Note that patient's pregnant test was mildly positive.  Patient states he is not pregnant and has had a miscarriage 2 months ago.  Will check qualitative pregnancy test.  Get pelvic ultrasound.   DVT prophylaxis:  SCDs for now until we make sure there is no obvious bleeding from the lung. Code Status: Full code. Family Communication: Discussed with patient. Disposition Plan: Home. Consults called: None. Admission status: Inpatient.   Rise Patience MD [facility] Pager [phone].  If 7PM-7AM, please contact night-coverage [email or web address] Password [facility]  07/11/2018, 9:21 PM

## 2018-07-12 ENCOUNTER — Inpatient Hospital Stay (HOSPITAL_COMMUNITY): Payer: Medicaid Other

## 2018-07-12 DIAGNOSIS — I351 Nonrheumatic aortic (valve) insufficiency: Secondary | ICD-10-CM

## 2018-07-12 LAB — CBC
HEMATOCRIT: 33.7 % — AB (ref 36.0–46.0)
HEMOGLOBIN: 10.1 g/dL — AB (ref 12.0–15.0)
MCH: 28.2 pg (ref 26.0–34.0)
MCHC: 30 g/dL (ref 30.0–36.0)
MCV: 94.1 fL (ref 80.0–100.0)
Platelets: 201 10*3/uL (ref 150–400)
RBC: 3.58 MIL/uL — ABNORMAL LOW (ref 3.87–5.11)
RDW: 14.1 % (ref 11.5–15.5)
WBC: 6.9 10*3/uL (ref 4.0–10.5)
nRBC: 0 % (ref 0.0–0.2)

## 2018-07-12 LAB — HCG, SERUM, QUALITATIVE: Preg, Serum: POSITIVE — AB

## 2018-07-12 LAB — MRSA PCR SCREENING: MRSA by PCR: NEGATIVE

## 2018-07-12 LAB — BASIC METABOLIC PANEL
Anion gap: 9 (ref 5–15)
BUN: 19 mg/dL (ref 6–20)
CO2: 22 mmol/L (ref 22–32)
Calcium: 8.7 mg/dL — ABNORMAL LOW (ref 8.9–10.3)
Chloride: 111 mmol/L (ref 98–111)
Creatinine, Ser: 1.99 mg/dL — ABNORMAL HIGH (ref 0.44–1.00)
GFR calc Af Amer: 35 mL/min — ABNORMAL LOW (ref >60)
GFR calc non Af Amer: 30 mL/min — ABNORMAL LOW (ref >60)
Glucose, Bld: 90 mg/dL (ref 70–99)
Potassium: 3.6 mmol/L (ref 3.5–5.1)
Sodium: 142 mmol/L (ref 135–145)

## 2018-07-12 LAB — ECHOCARDIOGRAM COMPLETE
Height: 66 in
Weight: 4730.19 oz

## 2018-07-12 LAB — PREGNANCY, URINE: Preg Test, Ur: NEGATIVE

## 2018-07-12 LAB — HIV ANTIBODY (ROUTINE TESTING W REFLEX): HIV Screen 4th Generation wRfx: NONREACTIVE

## 2018-07-12 LAB — ABO/RH: ABO/RH(D): A POS

## 2018-07-12 LAB — INFLUENZA PANEL BY PCR (TYPE A & B)
Influenza A By PCR: NEGATIVE
Influenza B By PCR: NEGATIVE

## 2018-07-12 MED ORDER — FUROSEMIDE 10 MG/ML IJ SOLN
40.0000 mg | Freq: Every day | INTRAMUSCULAR | Status: DC
  Administered 2018-07-12 – 2018-07-13 (×2): 40 mg via INTRAVENOUS
  Filled 2018-07-12 (×2): qty 4

## 2018-07-12 MED ORDER — ENOXAPARIN SODIUM 40 MG/0.4ML ~~LOC~~ SOLN
40.0000 mg | SUBCUTANEOUS | Status: DC
  Filled 2018-07-12 (×3): qty 0.4

## 2018-07-12 MED ORDER — SODIUM CHLORIDE 0.9 % IV SOLN
100.0000 mg | Freq: Two times a day (BID) | INTRAVENOUS | Status: DC
  Administered 2018-07-12: 100 mg via INTRAVENOUS
  Filled 2018-07-12: qty 100

## 2018-07-12 MED ORDER — HYDRALAZINE HCL 20 MG/ML IJ SOLN: 10.0000 mg | INTRAMUSCULAR | Status: DC | PRN

## 2018-07-12 NOTE — Progress Notes (Signed)
Report received from [place] at [facility], RN. Awaiting pt to be transported to 2W11.

## 2018-07-12 NOTE — Progress Notes (Signed)
  Echocardiogram 2D Echocardiogram has been performed.  Johny Chess 07/12/2018, 10:25 AM

## 2018-07-12 NOTE — Progress Notes (Signed)
PROGRESS NOTE    Dawn Thomas  TIW:[id number] DOB: 04/20/1976 DOA: 07/11/2018 PCP: Virginia Rochester, PA    Brief Narrative:  42 year old female who presented with dyspnea.  No significant past medical history for hypertension, asthma, hypothyroidism, chronic kidney disease and prior history of gastric bypass.  Reported 24 to 48 hours of progressive dyspnea, associated with PND frothy sputum, she has been not taking her blood pressure medications for last 4 days.  Her blood pressure was 190/1 1, heart rate 62, respiratory rate 20, temperature 98.3, oxygen saturation 100%, lungs with no rhonchi or rales, heart S1-2 present and rhythmic, soft nontender, no extremity edema.  Sodium 141 potassium 3.5, Chloride 109, bicarbonate 25, glucose 103, BUN 20, creatinine 1.59, BNP 282, troponin 0.03, white count 10.3, hemoglobin 12,5, hematocrit 41.5, platelets 217.  D-dimer 1.6, urinalysis negative for infection.  Chest x-ray with bilateral interstitial infiltrates with vascular congestion, CT chest with bilateral groundglass opacities.  EKG normal axis, left axis deviation, normal intervals.  Patient was admitted to the hospital with working diagnosis of acute hypoxic respiratory failure due to pulmonary edema, rule out infectious process or alveolar hemorrhage.  Assessment & Plan:   Principal Problem:   Acute respiratory failure with hypoxia (HCC) Active Problems:   CAP (community acquired pneumonia)   Multifocal pneumonia   Hypertensive urgency   Hypothyroidism   1. Acute hypoxic respiratory failure due to acute cardiogenic pulmonary edema. Will continue diuresis with IV furosemide to target a negative fluid balance, will continue blood pressure control with hctz and losartan  Patient not taking clonidine at home. Will follow echocardiogram. Hold on labetalol due to bradycardia.   2. Multifocal pneumonia. Low pretest probability for infectious process, will continue diuresis and will follow chest  film. Patient with no fever or leukocytosis. Will hold on antibiotic therapy for now.  3. Uncontrolled HTN. Medical non compliance, will resume antihypertensive regimen with losartan, and hctz, hold on labetalol due to bradycardia.   4. Hypothyroid. Will continue levothyroxine per home regimen.   5. Morbid Obesity. BMI up to 47,2, patient sp gastric bypass, will need follow up as outpatient.      DVT prophylaxis: enoxaparin   Code Status: full Family Communication: no family at the bedside Disposition Plan/ discharge barriers: transfer to telemetry.   Body mass index is 47.72 kg/m. Malnutrition Type:      Malnutrition Characteristics:      Nutrition Interventions:     RN Pressure Injury Documentation:     Consultants:     Procedures:     Antimicrobials:       Subjective: Patient with no chest pain, her dyspnea has improved but is not back to baseline, no nausea or vomiting. At home not taking clonidine or hctz.   Objective: Vitals:   07/12/18 0029 07/12/18 0430 07/12/18 0623 07/12/18 0700  BP: 140/82 139/69  140/74  Pulse: (!) 54 (!) 53    Resp: 15 17    Temp: 97.6 F (36.4 C) 97.6 F (36.4 C)  97.8 F (36.6 C)  TempSrc: Oral Oral  Axillary  SpO2: 93% 100%  94%  Weight:   134.1 kg   Height:        Intake/Output Summary (Last 24 hours) at 07/12/2018 0919 Last data filed at 07/11/2018 2100 Gross per 24 hour  Intake 240 ml  Output -  Net 240 ml   Filed Weights   07/11/18 0823 07/11/18 2100 07/12/18 0623  Weight: 124.7 kg 132 kg 134.1 kg    Examination:  General: deconditioned  Neurology: Awake and alert, non focal  E ENT: mild pallor, no icterus, oral mucosa moist Cardiovascular: No JVD. S1-S2 present, rhythmic, no gallops, rubs, or murmurs. ++ non pitting lower extremity edema. Pulmonary: decreased breath sounds bilaterally, no wheezing, rhonchi or rales. Gastrointestinal. Abdomen protuberant, no organomegaly, non tender, no rebound  or guarding Skin. No rashes Musculoskeletal: no joint deformities     Data Reviewed: I have personally reviewed following labs and imaging studies  CBC: Recent Labs  Lab 07/11/18 0915 07/11/18 2200 07/12/18 0240  WBC 10.3 8.7 6.9  NEUTROABS 8.8*  --   --   HGB 12.5 11.5* 10.1*  HCT 41.5 37.0 33.7*  MCV 95.6 93.2 94.1  PLT 217 201 315   Basic Metabolic Panel: Recent Labs  Lab 07/11/18 0915 07/12/18 0240  NA 141 142  K 3.5 3.6  CL 109 111  CO2 25 22  GLUCOSE 103* 90  BUN 20 19  CREATININE 1.59* 1.99*  CALCIUM 9.2 8.7*   GFR: Estimated Creatinine Clearance: 51.9 mL/min (A) (by C-G formula based on SCr of 1.99 mg/dL (H)). Liver Function Tests: Recent Labs  Lab 07/11/18 0915  AST 19  ALT 16  ALKPHOS 60  BILITOT 0.7  PROT 8.0  ALBUMIN 4.3   No results for input(s): LIPASE, AMYLASE in the last 168 hours. No results for input(s): AMMONIA in the last 168 hours. Coagulation Profile: No results for input(s): INR, PROTIME in the last 168 hours. Cardiac Enzymes: Recent Labs  Lab 07/11/18 0915 07/11/18 1349 07/11/18 2200  TROPONINI 0.03* 0.03* <0.03   BNP (last 3 results) No results for input(s): PROBNP in the last 8760 hours. HbA1C: No results for input(s): HGBA1C in the last 72 hours. CBG: No results for input(s): GLUCAP in the last 168 hours. Lipid Profile: No results for input(s): CHOL, HDL, LDLCALC, TRIG, CHOLHDL, LDLDIRECT in the last 72 hours. Thyroid Function Tests: Recent Labs    07/11/18 2200  TSH 1.987   Anemia Panel: No results for input(s): VITAMINB12, FOLATE, FERRITIN, TIBC, IRON, RETICCTPCT in the last 72 hours.    Radiology Studies: I have reviewed all of the imaging during this hospital visit personally     Scheduled Meds: . amLODipine  10 mg Oral Daily  . cloNIDine  0.2 mg Oral BID  . hydrochlorothiazide  25 mg Oral Daily  . labetalol  200 mg Oral BID  . levothyroxine  75 mcg Oral Q0600  . losartan  100 mg Oral Daily    Continuous Infusions: . cefTRIAXone (ROCEPHIN)  IV 1 g (07/12/18 0908)  . doxycycline (VIBRAMYCIN) IV       LOS: 1 day        Tawni Millers, MD [facility] Pager [phone]

## 2018-07-13 ENCOUNTER — Inpatient Hospital Stay (HOSPITAL_COMMUNITY): Payer: Medicaid Other

## 2018-07-13 DIAGNOSIS — J9601 Acute respiratory failure with hypoxia: Secondary | ICD-10-CM

## 2018-07-13 LAB — CBC
HCT: 38.9 % (ref 36.0–46.0)
Hemoglobin: 11.8 g/dL — ABNORMAL LOW (ref 12.0–15.0)
MCH: 28.6 pg (ref 26.0–34.0)
MCHC: 30.3 g/dL (ref 30.0–36.0)
MCV: 94.4 fL (ref 80.0–100.0)
Platelets: 200 10*3/uL (ref 150–400)
RBC: 4.12 MIL/uL (ref 3.87–5.11)
RDW: 14.1 % (ref 11.5–15.5)
WBC: 5.8 10*3/uL (ref 4.0–10.5)
nRBC: 0 % (ref 0.0–0.2)

## 2018-07-13 LAB — BASIC METABOLIC PANEL
Anion gap: 9 (ref 5–15)
BUN: 25 mg/dL — ABNORMAL HIGH (ref 6–20)
CO2: 25 mmol/L (ref 22–32)
Calcium: 8.8 mg/dL — ABNORMAL LOW (ref 8.9–10.3)
Chloride: 110 mmol/L (ref 98–111)
Creatinine, Ser: 2.06 mg/dL — ABNORMAL HIGH (ref 0.44–1.00)
GFR calc Af Amer: 34 mL/min — ABNORMAL LOW (ref >60)
GFR, EST NON AFRICAN AMERICAN: 29 mL/min — AB (ref >60)
GLUCOSE: 116 mg/dL — AB (ref 70–99)
Potassium: 3.5 mmol/L (ref 3.5–5.1)
Sodium: 144 mmol/L (ref 135–145)

## 2018-07-13 LAB — PROCALCITONIN: Procalcitonin: <0.1 ng/mL

## 2018-07-13 LAB — LEGIONELLA PNEUMOPHILA SEROGP 1 UR AG: L. pneumophila Serogp 1 Ur Ag: NEGATIVE

## 2018-07-13 LAB — HCG, QUANTITATIVE, PREGNANCY: hCG, Beta Chain, Quant, S: 12 m[IU]/mL — ABNORMAL HIGH (ref <5)

## 2018-07-13 MED ORDER — LABETALOL HCL 100 MG PO TABS
100.0000 mg | ORAL_TABLET | Freq: Two times a day (BID) | ORAL | Status: DC
  Administered 2018-07-13 – 2018-07-14 (×3): 100 mg via ORAL
  Filled 2018-07-13 (×3): qty 1

## 2018-07-13 MED ORDER — FUROSEMIDE 20 MG PO TABS
20.0000 mg | ORAL_TABLET | Freq: Every day | ORAL | Status: DC
  Administered 2018-07-13 – 2018-07-14 (×2): 20 mg via ORAL
  Filled 2018-07-13 (×2): qty 1

## 2018-07-13 MED ORDER — POTASSIUM CHLORIDE CRYS ER 20 MEQ PO TBCR
40.0000 meq | EXTENDED_RELEASE_TABLET | Freq: Once | ORAL | Status: AC
  Administered 2018-07-13: 40 meq via ORAL
  Filled 2018-07-13: qty 2

## 2018-07-13 NOTE — Progress Notes (Addendum)
PROGRESS NOTE    Dawn Thomas  PPJ:[id number] DOB: [birth year] DOA: 07/11/2018 PCP: Virginia Rochester, PA    Brief Narrative: Team 1 transfer 42 year old female with history of asthma, hypertension, chronic kidney disease stage III, LVH, history of Gastric bypass presented to the ED with 2 days of progressive dyspnea on exertion, cough productive of frothy sputum, and reported having stopped her blood pressure medications 5 days prior to admission. -In the emergency room chest x-ray noted bilateral interstitial infiltrates and vascular congestion, CT chest showed possible multifocal pneumonia, she was started on IV Lasix and antibiotics in the ED, antibiotics were subsequently discontinued by my partner yesterday and continued on diuretics.  Assessment & Plan:  1. Acute hypoxic respiratory failure  -Most likely secondary to pulmonary edema  -Improving with diuresis she is negative approximately 3.8 L  -CT chest does note ? of interstitial infiltrates, no fever or leukocytosis, check procalcitonin level  -2D echocardiogram showed EF of 45 to 50% which is slightly lower than her baseline of 55% from 2018, also notes moderate LVH -Continue IV Lasix today -Restart labetalol at a lower dose -ESR is within normal range, repeat chest x-ray today shows resolution of interstitial infiltrates which points towards pulmonary edema as the presenting etiology -discontinue HCTZ  2.  Acute on chronic diastolic CHF -Diuretics as above, echo noted above  3.  Multifocal pneumonia -Less likely, see discussion above -Check procalcitonin level  4.  Uncontrolled hypertension -Recent history of noncompliance -Restarted diuretics, labetalol at a lower dose, stopped HCTZ -We will plan on restarting losartan at a lower dose at discharge for CKD 3  5.  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  Hypothyroidism -Continue  Synthroid  7.  Weakly positive beta hCG -Suspect this is from recent pregnancies followed by miscarriage in October -Quantitative beta-hCG came back at 27 which is very low, discussed with OB/GYN on-call Dr. Roselie Awkward, this could be secondary to recent pregnancy and miscarriage recommended quick follow-up with her OB/GYN in 7 to 10 days   8.  Morbid obesity -History of gastric bypass  DVT prophylaxis: enoxaparin   Code Status: full Family Communication: no family at the bedside Disposition Plan/ discharge barriers: Home pending improvement  Consultants:     Procedures:     Antimicrobials:       Subjective: -Breathing much better, close to baseline at this time Objective: Vitals:   07/12/18 1817 07/12/18 2320 07/13/18 0553 07/13/18 0744  BP: 140/70 (!) 145/73  (!) 152/78  Pulse: (!) 59 69  (!) 56  Resp:  18    Temp: 98.1 F (36.7 C) 98.8 F (37.1 C)  98.3 F (36.8 C)  TempSrc: Oral Oral  Oral  SpO2: 98% 98%  98%  Weight:   130.9 kg   Height:        Intake/Output Summary (Last 24 hours) at 07/13/2018 0930 Last data filed at 07/12/2018 1157 Gross per 24 hour  Intake -  Output 1000 ml  Net -1000 ml   Filed Weights   07/11/18 2100 07/12/18 0623 07/13/18 0553  Weight: 132 kg 134.1 kg 130.9 kg    Examination:   Gen: Orbitally obese female, sitting up in bed awake, Alert, Oriented X 3, no distress HEENT: PERRLA, Neck supple, no JVD Lungs: Improved air movement, rare basilar rhonchi CVS: S1-S2/regular rate rhythm Abd: soft, Non tender, non distended, BS present Extremities: No edema Skin: no new rashes  Data Reviewed: I have personally reviewed following labs and imaging studies  CBC: Recent Labs  Lab 07/11/18 0915 07/11/18 2200 07/12/18 0240 07/13/18 0839  WBC 10.3 8.7 6.9 5.8  NEUTROABS 8.8*  --   --   --   HGB 12.5 11.5* 10.1* 11.8*  HCT 41.5 37.0 33.7* 38.9  MCV 95.6 93.2 94.1 94.4  PLT 217 201 201 694   Basic Metabolic Panel: Recent  Labs  Lab 07/11/18 0915 07/12/18 0240  NA 141 142  K 3.5 3.6  CL 109 111  CO2 25 22  GLUCOSE 103* 90  BUN 20 19  CREATININE 1.59* 1.99*  CALCIUM 9.2 8.7*   GFR: Estimated Creatinine Clearance: 51.1 mL/min (A) (by C-G formula based on SCr of 1.99 mg/dL (H)). Liver Function Tests: Recent Labs  Lab 07/11/18 0915  AST 19  ALT 16  ALKPHOS 60  BILITOT 0.7  PROT 8.0  ALBUMIN 4.3   No results for input(s): LIPASE, AMYLASE in the last 168 hours. No results for input(s): AMMONIA in the last 168 hours. Coagulation Profile: No results for input(s): INR, PROTIME in the last 168 hours. Cardiac Enzymes: Recent Labs  Lab 07/11/18 0915 07/11/18 1349 07/11/18 2200  TROPONINI 0.03* 0.03* <0.03   BNP (last 3 results) No results for input(s): PROBNP in the last 8760 hours. HbA1C: No results for input(s): HGBA1C in the last 72 hours. CBG: No results for input(s): GLUCAP in the last 168 hours. Lipid Profile: No results for input(s): CHOL, HDL, LDLCALC, TRIG, CHOLHDL, LDLDIRECT in the last 72 hours. Thyroid Function Tests: Recent Labs    07/11/18 2200  TSH 1.987   Anemia Panel: No results for input(s): VITAMINB12, FOLATE, FERRITIN, TIBC, IRON, RETICCTPCT in the last 72 hours.    Radiology Studies: I have reviewed all of the imaging during this hospital visit personally     Scheduled Meds: . amLODipine  10 mg Oral Daily  . enoxaparin (LOVENOX) injection  40 mg Subcutaneous Q24H  . furosemide  40 mg Intravenous Daily  . levothyroxine  75 mcg Oral Q0600   Continuous Infusions:    LOS: 2 days        Domenic Polite, MD [facility] Pager [phone]

## 2018-07-14 LAB — CBC
HCT: 35.4 % — ABNORMAL LOW (ref 36.0–46.0)
Hemoglobin: 10.9 g/dL — ABNORMAL LOW (ref 12.0–15.0)
MCH: 29 pg (ref 26.0–34.0)
MCHC: 30.8 g/dL (ref 30.0–36.0)
MCV: 94.1 fL (ref 80.0–100.0)
Platelets: 203 10*3/uL (ref 150–400)
RBC: 3.76 MIL/uL — ABNORMAL LOW (ref 3.87–5.11)
RDW: 14.1 % (ref 11.5–15.5)
WBC: 7 10*3/uL (ref 4.0–10.5)
nRBC: 0 % (ref 0.0–0.2)

## 2018-07-14 LAB — BASIC METABOLIC PANEL
Anion gap: 10 (ref 5–15)
BUN: 25 mg/dL — ABNORMAL HIGH (ref 6–20)
CO2: 25 mmol/L (ref 22–32)
Calcium: 8.8 mg/dL — ABNORMAL LOW (ref 8.9–10.3)
Chloride: 107 mmol/L (ref 98–111)
Creatinine, Ser: 2.11 mg/dL — ABNORMAL HIGH (ref 0.44–1.00)
GFR calc Af Amer: 33 mL/min — ABNORMAL LOW (ref >60)
GFR calc non Af Amer: 28 mL/min — ABNORMAL LOW (ref >60)
GLUCOSE: 87 mg/dL (ref 70–99)
Potassium: 3.8 mmol/L (ref 3.5–5.1)
Sodium: 142 mmol/L (ref 135–145)

## 2018-07-14 MED ORDER — FUROSEMIDE 40 MG PO TABS: 40.0000 mg | ORAL_TABLET | Freq: Every day | ORAL | 0 refills | Status: DC

## 2018-07-14 MED ORDER — LABETALOL HCL 100 MG PO TABS: 100.0000 mg | ORAL_TABLET | Freq: Two times a day (BID) | ORAL | 0 refills | Status: DC

## 2018-07-16 LAB — CULTURE, BLOOD (ROUTINE X 2)
Culture: NO GROWTH
Culture: NO GROWTH
Special Requests: ADEQUATE
Special Requests: ADEQUATE

## 2018-08-03 NOTE — Discharge Summary (Signed)
Physician Discharge Summary  Dawn Thomas ZOX:[id number] DOB: 05-Jul-1976 DOA: 07/11/2018  PCP: Virginia Rochester, PA  Admit date: 07/11/2018 Discharge date: 07/15/2019  Time spent: 35 minutes  Recommendations for Outpatient Follow-up:  PCP in 1 week with [facility], msg sent to [place] [facility] for FU Primary Nephrologist in [facility] in 1 month Primary Gynecologist in 7-10days to repeat B-HCG  Discharge Diagnoses:  Principal Problem:   Acute respiratory failure with hypoxia ([place])   Pulmonary edema   Acute on chronic diastolic CHF   Hypertensive urgency   Hypothyroidism   CKD stage 3   Hypothyroidism   Obesity   Discharge Condition: stable, improved  Diet recommendation: heart healthy, low salt  Filed Weights   07/11/18 2100 07/12/18 0623 07/13/18 0553  Weight: 132 kg 134.1 kg 130.9 kg    History of present illness:  Team 1 transfer 43 year old female with history of asthma, hypertension, chronic kidney disease stage III, LVH, history of Gastric bypass presented to the ED with 2 days of progressive dyspnea on exertion, cough productive of frothy sputum, and reported having stopped her blood pressure medications 5 days prior to admission. -In the emergency room chest x-ray noted bilateral interstitial infiltrates and vascular congestion  Hospital Course:   1. Acute hypoxic respiratory failure  -Most likely secondary to pulmonary edema  -Improving with diuresis she is negative approximately 3.8 L  -CT chest does note ? of interstitial infiltrates, no fever or leukocytosis,  and procalcitonin level was normal, my partner rightfully stopped her Abx after day 1 -2D echocardiogram showed EF of 45 to 50% which is slightly lower than her baseline of 55% from 2018, also notes moderate LVH -Diuresed with  IV Lasix, clinically improved, restarted labetalol at a lower dose -ESR is within normal range, repeat chest x-ray shows resolution of interstitial infiltrates  which points towards pulmonary edema as the presenting etiology -discontinued HCTZ and discharged home on PO lasix instead, needs [facility] in 1 week  2.  Acute on chronic diastolic CHF -Diuretics as above, echo noted above  3.  Multifocal pneumonia -Not likely, see discussion above -no other s/s of infection, Xray findings resolved with diuresis  4.  Uncontrolled hypertension -Recent history of noncompliance -Restarted diuretics, labetalol at a lower dose, stopped HCTZ  5.  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  Hypothyroidism -Continue Synthroid  7.  Weakly positive beta hCG -Suspect this is from recent pregnancies followed by miscarriage in October -Quantitative beta-hCG came back at 13 which is very low, discussed with OB/GYN on-call Dr. Roselie Awkward, this could be secondary to recent pregnancy and miscarriage recommended quick follow-up with her OB/GYN in 7 to 10 days   8.  Morbid obesity -History of gastric bypass   Discharge Exam: Vitals:   07/13/18 2348 07/14/18 0831  BP: 129/64 (!) 171/83  Pulse: (!) 57 (!) 57  Resp: 18 17  Temp: 98 F (36.7 C) 98.3 F (36.8 C)  SpO2: 97% 99%    General: AAOx3 Cardiovascular: S1S2/RRR Respiratory: CTAB  Discharge Instructions   Discharge Instructions    Diet - low sodium heart healthy   Complete by:  As directed    Increase activity slowly   Complete by:  As directed      Allergies as of 07/14/2018      Reactions   Maxalt [rizatriptan Benzoate] Shortness Of Breath   Nsaids Other (See  Comments)   Renal insufficiency.   Omeprazole Hives   Adhesive [tape] Other (See Comments)   Burns skin   Hydrocodone Hives   Penicillins Hives   Zithromax [azithromycin] Other (See Comments)   "messes with my breathing"      Medication List    STOP taking these medications   cloNIDine 0.2 MG  tablet Commonly known as:  CATAPRES   losartan 100 MG tablet Commonly known as:  COZAAR   losartan-hydrochlorothiazide 100-25 MG tablet Commonly known as:  HYZAAR     TAKE these medications   albuterol 108 (90 Base) MCG/ACT inhaler Commonly known as:  PROVENTIL HFA;VENTOLIN HFA Inhale 2 puffs into the lungs every 6 (six) hours as needed for wheezing or shortness of breath.   amLODipine 10 MG tablet Commonly known as:  NORVASC Take 10 mg by mouth daily.   furosemide 40 MG tablet Commonly known as:  LASIX Take 1 tablet (40 mg total) by mouth daily. What changed:    medication strength  how much to take  when to take this   labetalol 100 MG tablet Commonly known as:  NORMODYNE Take 1 tablet (100 mg total) by mouth 2 (two) times daily. What changed:    medication strength  how much to take   levothyroxine 75 MCG tablet Commonly known as:  SYNTHROID, LEVOTHROID Take 75 mcg by mouth daily.   prenatal multivitamin Tabs tablet Take 1 tablet by mouth daily at 12 noon.      Allergies  Allergen Reactions  . Maxalt [Rizatriptan Benzoate] Shortness Of Breath  . Nsaids Other (See Comments)    Renal insufficiency.  . Omeprazole Hives  . Adhesive [Tape] Other (See Comments)    Burns skin  . Hydrocodone Hives  . Penicillins Hives  . Zithromax [Azithromycin] Other (See Comments)    "messes with my breathing"   Follow-up Information    [facility], [place], [place]. Schedule an appointment as soon as possible for a visit in 1 week(s).   Specialty:  Family Medicine Contact information: [address] [place] [address] [phone]        GYnecologist. Schedule an appointment as soon as possible for a visit in 1 week(s).        Kidney Doctor. Schedule an appointment as soon as possible for a visit in 2 week(s).   Why:  with labs           The results of significant diagnostics from this hospitalization (including imaging, microbiology, ancillary  and laboratory) are listed below for reference.    Significant Diagnostic Studies: Dg Chest 2 View  Result Date: 07/13/2018 CLINICAL DATA:  Dyspnea.  Acute respiratory failure with hypoxia. EXAM: CHEST - 2 VIEW COMPARISON:  07/11/2018 FINDINGS: Stable mild cardiomegaly. There has been interval resolution of previously seen bilateral airspace disease. No evidence of pleural effusion. IMPRESSION: Interval resolution of bilateral airspace disease since previous study. No active lung disease. Electronically Signed   By: Earle Gell M.D.   On: 07/13/2018 08:22   Dg Chest 2 View  Result Date: 07/11/2018 CLINICAL DATA:  Shortness of breath.  Cough. EXAM: CHEST - 2 VIEW COMPARISON:  May 17, 2017 FINDINGS: There is patchy airspace consolidation in the right lower lobe. The lungs elsewhere are clear. Heart is borderline enlarged with pulmonary vascularity within normal limits. No adenopathy. There is degenerative change in the lower thoracic spine. IMPRESSION: Airspace opacity consistent with pneumonia right base region. Lungs elsewhere clear. Heart mildly enlarged with pulmonary vascularity  normal. Electronically Signed   By: Lowella Grip III M.D.   On: 07/11/2018 09:13   Ct Angio Chest Pe W/cm &/or Wo Cm  Result Date: 07/11/2018 CLINICAL DATA:  Shortness of breath and elevated D-dimer EXAM: CT ANGIOGRAPHY CHEST WITH CONTRAST TECHNIQUE: Multidetector CT imaging of the chest was performed using the standard protocol during bolus administration of intravenous contrast. Multiplanar CT image reconstructions and MIPs were obtained to evaluate the vascular anatomy. CONTRAST:  126m ISOVUE-370 IOPAMIDOL (ISOVUE-370) INJECTION 76% COMPARISON:  Chest radiograph July 11, 2018 FINDINGS: Cardiovascular: There is no demonstrable pulmonary embolus. There is no thoracic aortic aneurysm or dissection. The visualized great vessels appear normal. There is no pericardial effusion or pericardial thickening. There is  a degree of left ventricular hypertrophy. Prominence of the main pulmonary outflow tract is noted, measuring 3.5 cm in diameter. Mediastinum/Nodes: Visualized thyroid appears unremarkable. There is no appreciable thoracic adenopathy. No esophageal lesions are evident. Lungs/Pleura: There is airspace consolidation throughout portions of each upper and lower lobe region. There is also patchy infiltrate in the inferior lingula. No pleural effusion evident. No appreciable interstitial edema. On axial slice 40 series 6, there is a 5 mm nodular opacity in the medial segment of the right middle lobe. Upper Abdomen: Visualized upper abdominal structures appear normal except for evidence of previous gastric bypass procedure, incompletely visualized. Musculoskeletal: There are foci of degenerative change in the thoracic spine. There is thoracic dextroscoliosis. There are no blastic or lytic bone lesions. No chest wall lesions are demonstrable. Review of the MIP images confirms the above findings. IMPRESSION: 1. No demonstrable pulmonary embolus. No thoracic aortic aneurysm or dissection. 2. Prominence of the main pulmonary outflow tract, a finding indicative of a degree of pulmonary arterial hypertension. 3. Multifocal airspace consolidation bilaterally involving multiple lobes in segments. Suspect widespread pneumonia. Diffuse pulmonary hemorrhage could present in this manner and is a differential consideration based on imaging appearance. Allergic type phenomenon with alveolar edema also is a differential consideration. 4. 5 mm nodular opacity right middle lobe. No follow-up needed if patient is low-risk. Non-contrast chest CT can be considered in 12 months if patient is high-risk. This recommendation follows the consensus statement: Guidelines for Management of Incidental Pulmonary Nodules Detected on CT Images: From the Fleischner Society 2017; Radiology 2017; 284:228-243. 5.  No appreciable thoracic adenopathy. 6.  Left  ventricular hypertrophy. 7.  Status post gastric bypass procedure. Electronically Signed   By: WLowella GripIII M.D.   On: 07/11/2018 13:39    Microbiology: No results found for this or any previous visit (from the past 240 hour(s)).   Labs: Basic Metabolic Panel: No results for input(s): NA, K, CL, CO2, GLUCOSE, BUN, CREATININE, CALCIUM, MG, PHOS in the last 168 hours. Liver Function Tests: No results for input(s): AST, ALT, ALKPHOS, BILITOT, PROT, ALBUMIN in the last 168 hours. No results for input(s): LIPASE, AMYLASE in the last 168 hours. No results for input(s): AMMONIA in the last 168 hours. CBC: No results for input(s): WBC, NEUTROABS, HGB, HCT, MCV, PLT in the last 168 hours. Cardiac Enzymes: No results for input(s): CKTOTAL, CKMB, CKMBINDEX, TROPONINI in the last 168 hours. BNP: BNP (last 3 results) Recent Labs    07/11/18 0915  BNP 282.5*    ProBNP (last 3 results) No results for input(s): PROBNP in the last 8760 hours.  CBG: No results for input(s): GLUCAP in the last 168 hours.     Signed:  PDomenic PoliteMD.  [facility] 08/03/2018, 4:08 PM

## 2018-12-16 IMAGING — RF DG UGI W/ KUB
11 series · 11 of 11 positions shown · non-contrast
Comparison: None.

CLINICAL DATA: 41-year-old female with morbid obesity. Preoperative
study prior to sleeve gastrectomy.

EXAM:
UPPER GI SERIES WITH KUB
TECHNIQUE: After obtaining a scout radiograph a routine upper GI series was
performed using thin barium.
FLUOROSCOPY TIME:  Fluoroscopy Time:  1.2 minutes
Radiation Exposure Index (if provided by the fluoroscopic device):
66.4 mGy

[Series 1: t abdomen supine · 0.15mm/px · 1 of 1 slices shown]
[im 1/1]
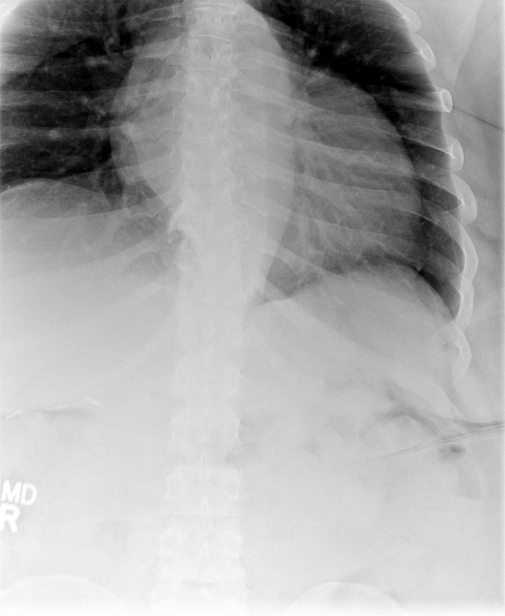

[Series 2: fluoro_barium 2fps_bw · 0.17mm/px · 1 of 1 slices shown (1 of 10)]
[im 1/1]
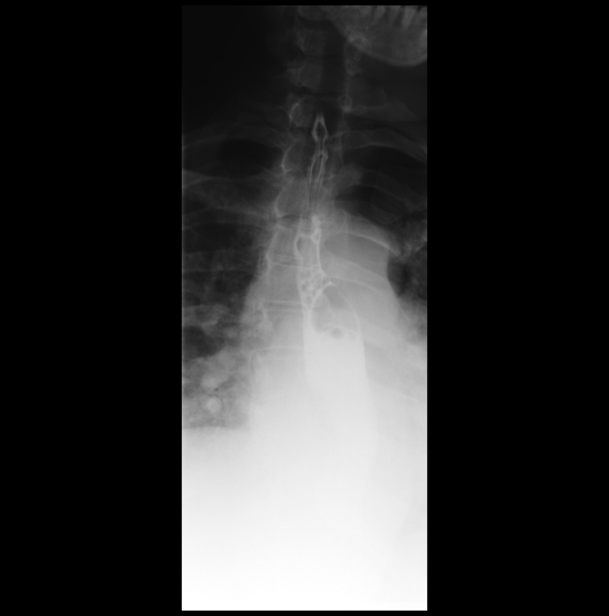

[Series 3: fluoro_barium 2fps_bw · 0.17mm/px · 1 of 1 slices shown (2 of 10)]
[im 1/1]
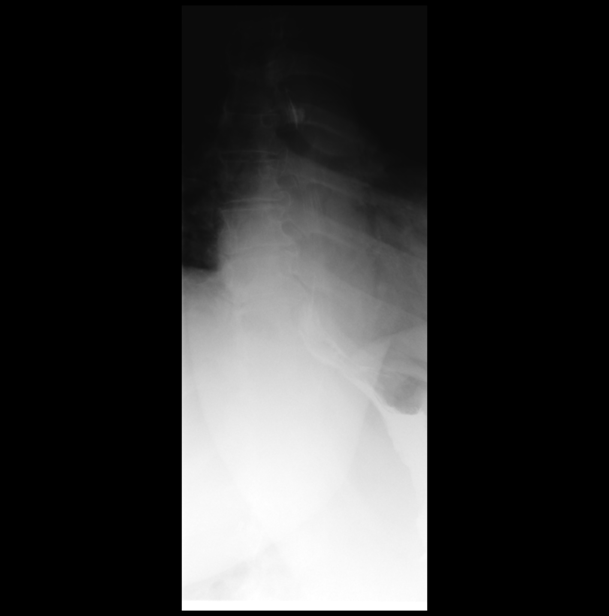

[Series 4: fluoro_barium 2fps_bw · 0.17mm/px · 1 of 1 slices shown (3 of 10)]
[im 1/1]
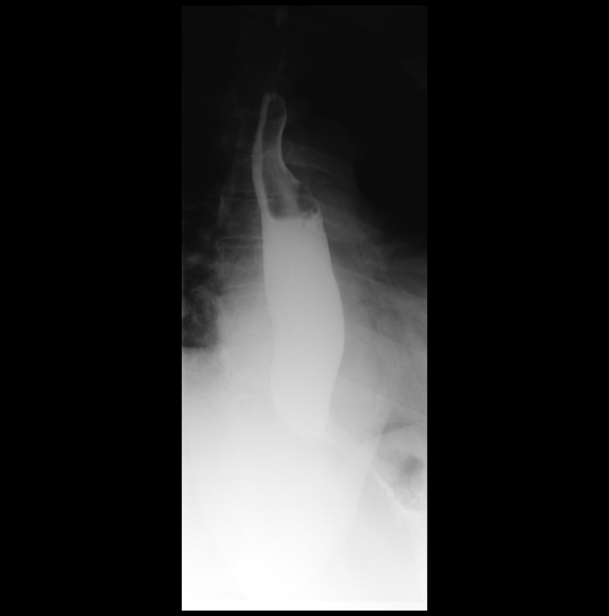

[Series 5: fluoro_barium 2fps_bw · 0.17mm/px · 1 of 1 slices shown (4 of 10)]
[im 1/1]
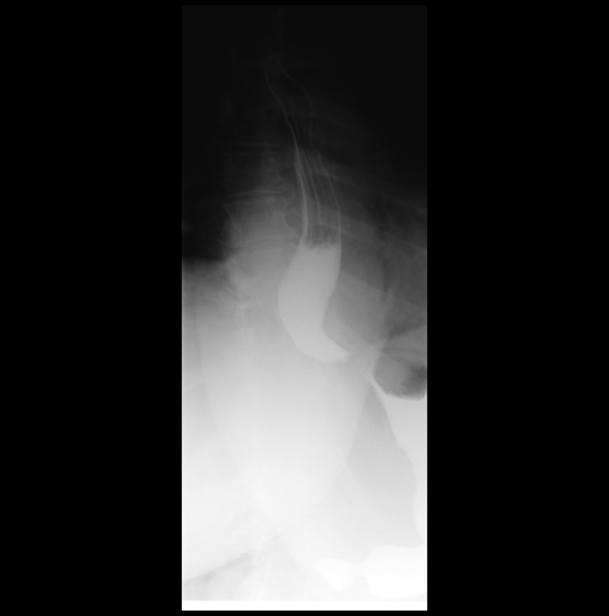

[Series 6: fluoro_barium 2fps_bw · 0.17mm/px · 1 of 1 slices shown (5 of 10)]
[im 1/1]
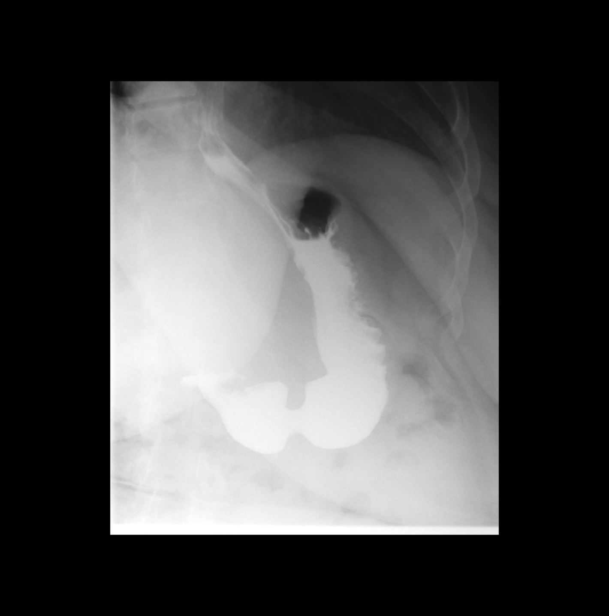

[Series 7: fluoro_barium 2fps_bw · 0.18mm/px · 1 of 1 slices shown (6 of 10)]
[im 1/1]
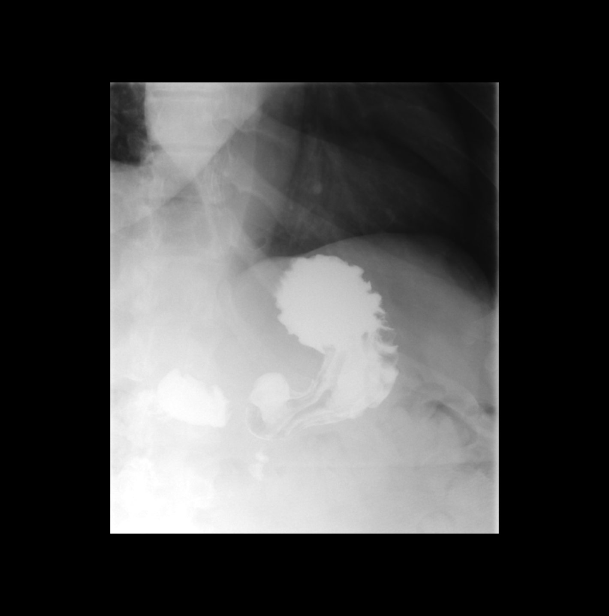

[Series 8: fluoro_barium 2fps_bw · 0.18mm/px · 1 of 1 slices shown (7 of 10)]
[im 1/1]
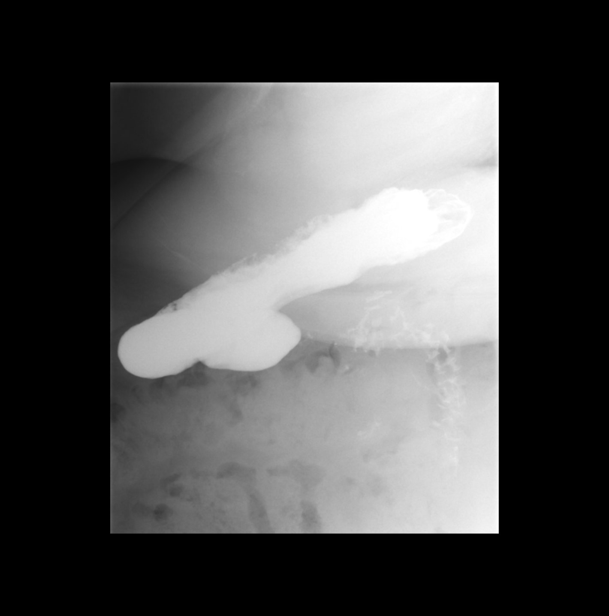

[Series 9: fluoro_barium 2fps_bw · 0.17mm/px · 1 of 1 slices shown (8 of 10)]
[im 1/1]
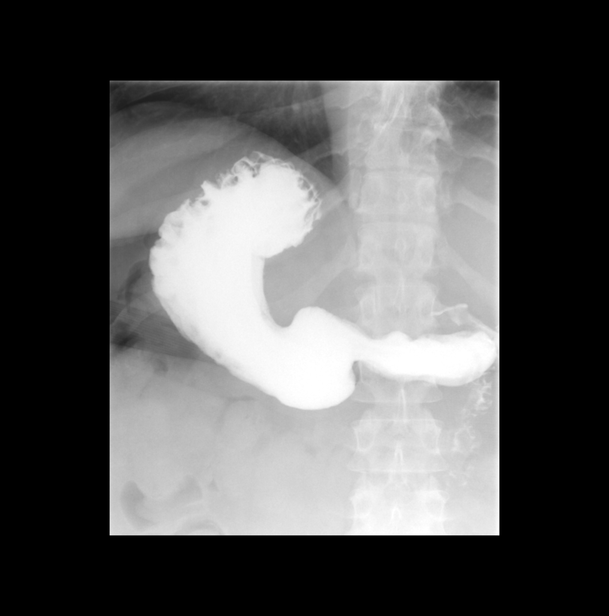

[Series 10: fluoro_barium 2fps_bw · 0.18mm/px · 1 of 1 slices shown (9 of 10)]
[im 1/1]
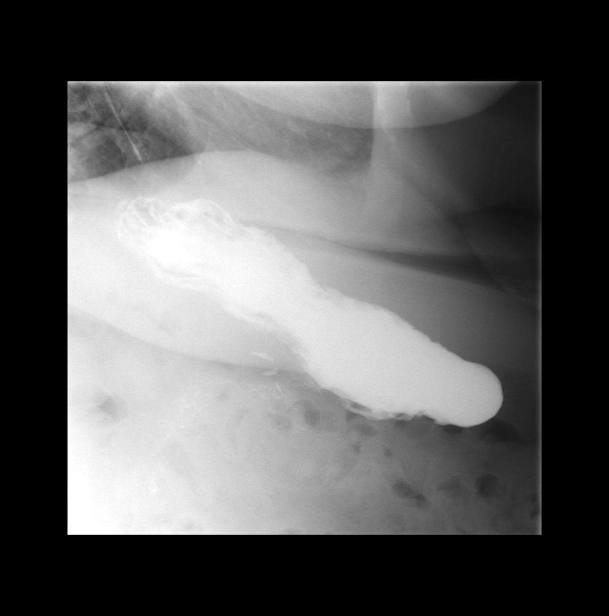

[Series 11: fluoro_barium 2fps_bw · 0.18mm/px · 1 of 1 slices shown (10 of 10)]
[im 1/1]
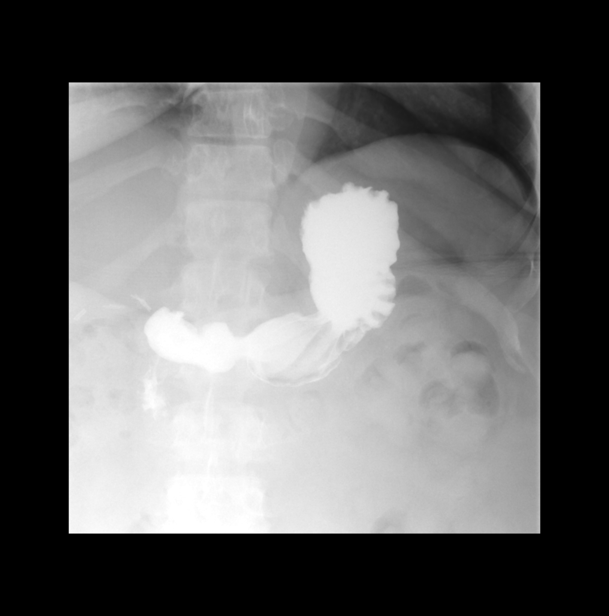

[11 of 11 positions shown; findings below may reference images not displayed]

FINDINGS: Preprocedural KUB demonstrates a nonobstructive bowel gas pattern.
Surgical clips in the right upper quadrant of the abdomen,
compatible with prior cholecystectomy. Single contrast images of the
esophagus demonstrated normal anatomy with normal motility. Single
contrast images of the stomach demonstrate normal anatomy, without
definite gastric mass or ulcer. Contrast readily traversed the
pylorus, extending into a normal-appearing duodenal bulb.
IMPRESSION: 1. Normal single contrast upper GI.

## 2019-01-21 ENCOUNTER — Encounter (HOSPITAL_BASED_OUTPATIENT_CLINIC_OR_DEPARTMENT_OTHER): Payer: Self-pay

## 2019-01-21 ENCOUNTER — Emergency Department (HOSPITAL_BASED_OUTPATIENT_CLINIC_OR_DEPARTMENT_OTHER): Payer: 59

## 2019-01-21 ENCOUNTER — Emergency Department (HOSPITAL_BASED_OUTPATIENT_CLINIC_OR_DEPARTMENT_OTHER)
Admission: EM | Admit: 2019-01-21 | Discharge: 2019-01-21 | Disposition: A | Discharge status: Home / Self Care | Payer: 59 | Attending: Emergency Medicine | Admitting: Emergency Medicine

## 2019-01-21 ENCOUNTER — Other Ambulatory Visit: Payer: Self-pay

## 2019-01-21 DIAGNOSIS — J45909 Unspecified asthma, uncomplicated: Secondary | ICD-10-CM | POA: Insufficient documentation

## 2019-01-21 DIAGNOSIS — Z79899 Other long term (current) drug therapy: Secondary | ICD-10-CM | POA: Insufficient documentation

## 2019-01-21 DIAGNOSIS — O4690 Antepartum hemorrhage, unspecified, unspecified trimester: Secondary | ICD-10-CM

## 2019-01-21 DIAGNOSIS — O26859 Spotting complicating pregnancy, unspecified trimester: Secondary | ICD-10-CM | POA: Diagnosis not present

## 2019-01-21 DIAGNOSIS — Z3A01 Less than 8 weeks gestation of pregnancy: Secondary | ICD-10-CM | POA: Insufficient documentation

## 2019-01-21 DIAGNOSIS — I10 Essential (primary) hypertension: Secondary | ICD-10-CM | POA: Diagnosis not present

## 2019-01-21 DIAGNOSIS — E039 Hypothyroidism, unspecified: Secondary | ICD-10-CM | POA: Diagnosis not present

## 2019-01-21 LAB — URINALYSIS, MICROSCOPIC (REFLEX)

## 2019-01-21 LAB — COMPREHENSIVE METABOLIC PANEL
ALT: 12 U/L (ref 0–44)
AST: 17 U/L (ref 15–41)
Albumin: 4 g/dL (ref 3.5–5.0)
Alkaline Phosphatase: 55 U/L (ref 38–126)
Anion gap: 7 (ref 5–15)
BUN: 25 mg/dL — ABNORMAL HIGH (ref 6–20)
CO2: 25 mmol/L (ref 22–32)
Calcium: 8.9 mg/dL (ref 8.9–10.3)
Chloride: 107 mmol/L (ref 98–111)
Creatinine, Ser: 1.96 mg/dL — ABNORMAL HIGH (ref 0.44–1.00)
GFR calc Af Amer: 35 mL/min — ABNORMAL LOW (ref >60)
GFR calc non Af Amer: 31 mL/min — ABNORMAL LOW (ref >60)
Glucose, Bld: 91 mg/dL (ref 70–99)
Potassium: 4 mmol/L (ref 3.5–5.1)
Sodium: 139 mmol/L (ref 135–145)
Total Bilirubin: 0.6 mg/dL (ref 0.3–1.2)
Total Protein: 7.9 g/dL (ref 6.5–8.1)

## 2019-01-21 LAB — URINALYSIS, ROUTINE W REFLEX MICROSCOPIC
Bilirubin Urine: NEGATIVE
Glucose, UA: NEGATIVE mg/dL
Hgb urine dipstick: NEGATIVE
Ketones, ur: NEGATIVE mg/dL
Leukocytes,Ua: NEGATIVE
Nitrite: NEGATIVE
Protein, ur: 100 mg/dL — AB
Specific Gravity, Urine: 1.02 (ref 1.005–1.030)
pH: 6 (ref 5.0–8.0)

## 2019-01-21 LAB — RH IG WORKUP (INCLUDES ABO/RH)
ABO/RH(D): A POS
Gestational Age(Wks): 6

## 2019-01-21 LAB — CBC WITH DIFFERENTIAL/PLATELET
Abs Immature Granulocytes: 0.02 10*3/uL (ref 0.00–0.07)
Basophils Absolute: 0 10*3/uL (ref 0.0–0.1)
Basophils Relative: 0 %
Eosinophils Absolute: 0.2 10*3/uL (ref 0.0–0.5)
Eosinophils Relative: 3 %
HCT: 40.3 % (ref 36.0–46.0)
Hemoglobin: 12.5 g/dL (ref 12.0–15.0)
Immature Granulocytes: 0 %
Lymphocytes Relative: 32 %
Lymphs Abs: 2.4 10*3/uL (ref 0.7–4.0)
MCH: 28.9 pg (ref 26.0–34.0)
MCHC: 31 g/dL (ref 30.0–36.0)
MCV: 93.3 fL (ref 80.0–100.0)
Monocytes Absolute: 0.4 10*3/uL (ref 0.1–1.0)
Monocytes Relative: 5 %
Neutro Abs: 4.6 10*3/uL (ref 1.7–7.7)
Neutrophils Relative %: 60 %
Platelets: 258 10*3/uL (ref 150–400)
RBC: 4.32 MIL/uL (ref 3.87–5.11)
RDW: 14.3 % (ref 11.5–15.5)
WBC: 7.6 10*3/uL (ref 4.0–10.5)
nRBC: 0 % (ref 0.0–0.2)

## 2019-01-21 LAB — WET PREP, GENITAL
Clue Cells Wet Prep HPF POC: NONE SEEN
Sperm: NONE SEEN
Trich, Wet Prep: NONE SEEN
Yeast Wet Prep HPF POC: NONE SEEN

## 2019-01-21 LAB — HCG, QUANTITATIVE, PREGNANCY: hCG, Beta Chain, Quant, S: 27166 m[IU]/mL — ABNORMAL HIGH (ref <5)

## 2019-01-21 LAB — PREGNANCY, URINE: Preg Test, Ur: POSITIVE — AB

## 2019-01-21 MED ORDER — SODIUM CHLORIDE 0.9 % IV BOLUS
500.0000 mL | Freq: Once | INTRAVENOUS | Status: AC
  Administered 2019-01-21: 500 mL via INTRAVENOUS

## 2019-01-21 MED ORDER — PYRIDOXINE HCL 50 MG PO TABS: 50.0000 mg | ORAL_TABLET | Freq: Three times a day (TID) | ORAL | 0 refills | Status: DC

## 2019-01-21 MED ORDER — METOCLOPRAMIDE HCL 5 MG/ML IJ SOLN
10.0000 mg | Freq: Once | INTRAMUSCULAR | Status: AC
  Administered 2019-01-21: 10 mg via INTRAVENOUS
  Filled 2019-01-21: qty 2

## 2019-01-21 MED ORDER — DOXYLAMINE SUCCINATE (SLEEP) 25 MG PO TABS: 25.0000 mg | ORAL_TABLET | Freq: Every day | ORAL | 0 refills | Status: DC

## 2019-01-21 MED ORDER — LABETALOL HCL 100 MG PO TABS
100.0000 mg | ORAL_TABLET | Freq: Once | ORAL | Status: DC
  Filled 2019-01-21: qty 1

## 2019-01-21 NOTE — ED Notes (Signed)
Patient transported to Ultrasound 

## 2019-01-21 NOTE — ED Provider Notes (Signed)
[facility] EMERGENCY DEPARTMENT Provider Note   CSN: [id number] Arrival date & time: 01/21/19  1354    History   Chief Complaint Chief Complaint  Patient presents with  . Vaginal Bleeding    HPI Dawn Thomas is a 43 y.o. female with history of HTN, migraine, obesity who is [redacted] weeks pregnant presents with a few day history of headache and 1 day history of vaginal spotting. She denies any significant pain, but does note some mild cramping like she is about to start her menstrual cycle. Patient denies any other vaginal bleeding. She has an appointment with OB/GYN in July. LMP 01/11/2019.  She denies any vision changes, chest pain, shortness of breath.  She has noticed some nausea today in the past few days.  She denies any urinary symptoms.     HPI  Past Medical History:  Diagnosis Date  . Asthma   . Complication of anesthesia    woke up during surgery   . Hypertension   . Hypothyroidism   . Migraines   . Renal disorder    stage 3- [facility] - Dr Olivia Mackie  . Renal insufficiency   . Sleep apnea    mild   . Tendonitis   . Thyroid disease     Patient Active Problem List   Diagnosis Date Noted  . CAP (community acquired pneumonia) 07/11/2018  . Acute respiratory failure with hypoxia ([place]) 07/11/2018  . Multifocal pneumonia 07/11/2018  . Hypertensive urgency 07/11/2018  . Hypothyroidism 07/11/2018  . Acute kidney injury ([place]) 05/31/2017  . Bradycardia 05/31/2017  . Morbid obesity ([place]) 05/30/2017  . Right ankle pain 01/18/2012    Past Surgical History:  Procedure Laterality Date  . CESAREAN SECTION    . CHOLECYSTECTOMY    . DILATION AND CURETTAGE OF UTERUS    . LAPAROSCOPIC GASTRIC SLEEVE RESECTION N/A 05/30/2017   Procedure: LAPAROSCOPIC GASTRIC SLEEVE RESECTION WITH UPPPER ENDO AND HIATAL HERNIA REPAIR;  Surgeon: Kieth Brightly, Arta Bruce, MD;  Location: [facility];  Service: General;  Laterality: N/A;     OB History    Gravida  7   Para  1   Term       Preterm      AB  4   Living        SAB  4   TAB      Ectopic      Multiple      Live Births  1            Home Medications    Prior to Admission medications   Medication Sig Start Date End Date Taking? Authorizing Provider  albuterol (PROVENTIL HFA;VENTOLIN HFA) 108 (90 BASE) MCG/ACT inhaler Inhale 2 puffs into the lungs every 6 (six) hours as needed for wheezing or shortness of breath.     [provider]  amLODipine (NORVASC) 10 MG tablet Take 10 mg by mouth daily.    [provider]  doxylamine, Sleep, (UNISOM) 25 MG tablet Take 1 tablet (25 mg total) by mouth at bedtime. 01/21/19   Carlus Stay, Bea Graff, PA-C  furosemide (LASIX) 40 MG tablet Take 1 tablet (40 mg total) by mouth daily. 07/14/18   Domenic Polite, MD  labetalol (NORMODYNE) 100 MG tablet Take 1 tablet (100 mg total) by mouth 2 (two) times daily. 07/14/18   Domenic Polite, MD  levothyroxine (SYNTHROID, LEVOTHROID) 75 MCG tablet Take 75 mcg by mouth daily.     [provider]  Prenatal Vit-Fe Fumarate-FA (PRENATAL  MULTIVITAMIN) TABS tablet Take 1 tablet by mouth daily at 12 noon.    [provider]  pyridOXINE (B-6) 50 MG tablet Take 1 tablet (50 mg total) by mouth 3 (three) times daily. 01/21/19   Frederica Kuster, PA-C    Family History Family History  Problem Relation Age of Onset  . Diabetes Mother   . Hypertension Mother   . Hypertension Father   . Diabetes Father   . Sudden death Father   . Heart attack Neg Hx   . Hyperlipidemia Neg Hx     Social History Social History   Tobacco Use  . Smoking status: Never Smoker  . Smokeless tobacco: Never Used  Substance Use Topics  . Alcohol use: No  . Drug use: No     Allergies   Maxalt [rizatriptan benzoate], Nsaids, Omeprazole, Adhesive [tape], Hydrocodone, Penicillins, and Zithromax [azithromycin]   Review of Systems Review of Systems  Constitutional: Negative for chills and fever.  HENT: Negative for  facial swelling and sore throat.   Eyes: Negative for visual disturbance.  Respiratory: Negative for shortness of breath.   Cardiovascular: Negative for chest pain.  Gastrointestinal: Negative for abdominal pain, nausea and vomiting.  Genitourinary: Positive for vaginal bleeding. Negative for dysuria and vaginal discharge.  Musculoskeletal: Negative for back pain and neck pain.  Skin: Negative for rash and wound.  Neurological: Positive for headaches.  Psychiatric/Behavioral: The patient is not nervous/anxious.      Physical Exam Updated Vital Signs BP (!) 182/74 (BP Location: Right Arm)   Pulse (!) 52   Temp 98.7 F (37.1 C) (Oral)   Resp 18   Ht 5\' 6"  (1.676 m)   Wt (!) 139.3 kg   LMP 07/02/2018   SpO2 100%   BMI 49.55 kg/m   Physical Exam Vitals signs and nursing note reviewed. Exam conducted with a chaperone present.  Constitutional:      General: She is not in acute distress.    Appearance: She is well-developed. She is obese. She is not diaphoretic.  HENT:     Head: Normocephalic and atraumatic.     Mouth/Throat:     Pharynx: No oropharyngeal exudate.  Eyes:     General: No scleral icterus.       Right eye: No discharge.        Left eye: No discharge.     Conjunctiva/sclera: Conjunctivae normal.     Pupils: Pupils are equal, round, and reactive to light.  Neck:     Musculoskeletal: Normal range of motion and neck supple.     Thyroid: No thyromegaly.  Cardiovascular:     Rate and Rhythm: Normal rate and regular rhythm.     Heart sounds: Normal heart sounds. No murmur. No friction rub. No gallop.   Pulmonary:     Effort: Pulmonary effort is normal. No respiratory distress.     Breath sounds: Normal breath sounds. No stridor. No wheezing or rales.  Abdominal:     General: Bowel sounds are normal. There is no distension.     Palpations: Abdomen is soft.     Tenderness: There is no abdominal tenderness. There is no guarding or rebound.  Genitourinary:     Vagina: No vaginal discharge or bleeding.     Cervix: Normal.     Uterus: Not tender.      Adnexa:        Right: No tenderness.         Left: No tenderness.    Lymphadenopathy:  Cervical: No cervical adenopathy.  Skin:    General: Skin is warm and dry.     Coloration: Skin is not pale.     Findings: No rash.  Neurological:     Mental Status: She is alert.     Coordination: Coordination normal.     Comments: CN 3-12 intact; normal sensation throughout; 5/5 strength in all 4 extremities; equal bilateral grip strength      ED Treatments / Results  Labs (all labs ordered are listed, but only abnormal results are displayed) Labs Reviewed  WET PREP, GENITAL - Abnormal; Notable for the following components:      Result Value   WBC, Wet Prep HPF POC FEW (*)    All other components within normal limits  COMPREHENSIVE METABOLIC PANEL - Abnormal; Notable for the following components:   BUN 25 (*)    Creatinine, Ser 1.96 (*)    GFR calc non Af Amer 31 (*)    GFR calc Af Amer 35 (*)    All other components within normal limits  HCG, QUANTITATIVE, PREGNANCY - Abnormal; Notable for the following components:   hCG, Beta Chain, Quant, S 27,166 (*)    All other components within normal limits  URINALYSIS, ROUTINE W REFLEX MICROSCOPIC - Abnormal; Notable for the following components:   Protein, ur 100 (*)    All other components within normal limits  PREGNANCY, URINE - Abnormal; Notable for the following components:   Preg Test, Ur POSITIVE (*)    All other components within normal limits  URINALYSIS, MICROSCOPIC (REFLEX) - Abnormal; Notable for the following components:   Bacteria, UA RARE (*)    All other components within normal limits  CBC WITH DIFFERENTIAL/PLATELET  RPR  HIV ANTIBODY (ROUTINE TESTING W REFLEX)  RH IG WORKUP (INCLUDES ABO/RH)  GC/CHLAMYDIA PROBE AMP ([place]) NOT AT [facility]    EKG None  Radiology US Ob Transvaginal  Result Date: 01/21/2019 CLINICAL  DATA:  Initial evaluation for acute vaginal bleeding, early pregnancy. EXAM: OBSTETRIC <14 WK [place] AND TRANSVAGINAL OB US TECHNIQUE: Both transabdominal and transvaginal ultrasound examinations were performed for complete evaluation of the gestation as well as the maternal uterus, adnexal regions, and pelvic cul-de-sac. Transvaginal technique was performed to assess early pregnancy. COMPARISON:  None available. FINDINGS: Intrauterine gestational sac: Single Yolk sac:  Possible small internal yolk sac. Embryo:  Not visualized. Cardiac Activity: N/A Heart Rate: N/A  bpm MSD: 12.7 mm   6 w   1 d Subchorionic hemorrhage:  None visualized. Maternal uterus/adnexae: Ovaries are normal in appearance bilaterally. Small corpus luteal cyst noted on the right. Associated small volume free fluid within the pelvis. IMPRESSION: 1. Probable early intrauterine gestational sac, but no definite yolk sac, fetal pole, or cardiac activity yet visualized. Recommend follow-up quantitative B-HCG levels and follow-up US in 14 days to confirm and assess viability. This recommendation follows SRU consensus guidelines: Diagnostic Criteria for Nonviable Pregnancy Early in the First Trimester. [place] [employer] Med 2013; 932:6712-45. 2. Right ovarian corpus luteal cyst with associated small volume free fluid within the pelvis. 3. No other acute maternal uterine or adnexal abnormality identified. Electronically Signed   By: Jeannine Boga M.D.   On: 01/21/2019 19:16    Procedures Procedures (including critical care time)  Medications Ordered in ED Medications  sodium chloride 0.9 % bolus 500 mL (0 mLs Intravenous Stopped 01/21/19 1832)  metoCLOPramide (REGLAN) injection 10 mg (10 mg Intravenous Given 01/21/19 1656)     Initial Impression /  Assessment and Plan / ED Course  I have reviewed the triage vital signs and the nursing notes.  Pertinent labs & imaging results that were available during my care of the patient were reviewed by me and  considered in my medical decision making (see chart for details).        Patient presenting with vaginal spotting at [redacted] weeks pregnant as well as headache and long-standing hypertension. Patient has no active bleeding on exam, no pain or tenderness.  Pelvic ultrasound shows probable intrauterine gestational sac, but no other definitive signs of pregnancy.  Patient is high risk due to age, obesity, longstanding hypertension, CKD.  Labs are stable for the patient.  She is feeling better after IV Reglan and fluids.  Headache is resolved.  Recommended doxylamine and pyridoxine for nausea in pregnancy.  Follow-up to OB/GYN for hCG in 48 hours.  Return precautions discussed.  Patient understands and agrees with plan.  Patient vitals stable and discharged in satisfactory condition.  Patient plans to take her blood pressure medication on return home. Blood pressure is stable for her baseline prior to discharge.  Final Clinical Impressions(s) / ED Diagnoses   Final diagnoses:  Vaginal bleeding in pregnancy    ED Discharge Orders         Ordered    doxylamine, Sleep, (UNISOM) 25 MG tablet  Daily at bedtime     01/21/19 2027    pyridOXINE (B-6) 50 MG tablet  3 times daily     01/21/19 2027           Frederica Kuster, PA-C 01/22/19 1446    Lajean Saver, MD 01/23/19 1711

## 2019-01-21 NOTE — Discharge Instructions (Signed)
Continue taking your prenatal vitamins.  Take only Tylenol as needed for pain.  Take doxylamine and vitamin B6 as prescribed, as needed for your nausea.  Please call your OB/GYN tomorrow to make them aware that you will need repeat hCG in 48 hours.  Please return to the emergency department or go to [facility] if you develop any new or worsening symptoms including severe pain, vaginal bleeding, or any other new concerning symptoms.

## 2019-01-21 NOTE — ED Triage Notes (Signed)
Pt c/o vaginal spotting x this am-no pad-states she is ~6-[redacted] weeks pregnant [place] yet-also c/o HA and elevated BP-NAD-steady gait

## 2019-01-22 LAB — GC/CHLAMYDIA PROBE AMP (~~LOC~~) NOT AT ARMC
Chlamydia: NEGATIVE
Neisseria Gonorrhea: NEGATIVE

## 2019-01-22 LAB — RPR: RPR Ser Ql: NONREACTIVE

## 2019-01-22 LAB — HIV ANTIBODY (ROUTINE TESTING W REFLEX): HIV Screen 4th Generation wRfx: NONREACTIVE

## 2019-12-18 ENCOUNTER — Encounter (HOSPITAL_COMMUNITY): Payer: Self-pay

## 2020-03-05 ENCOUNTER — Ambulatory Visit: Payer: 59 | Admitting: Psychology

## 2020-03-19 ENCOUNTER — Ambulatory Visit: Payer: 59 | Admitting: Psychology

## 2020-04-02 ENCOUNTER — Ambulatory Visit: Payer: 59 | Admitting: Psychology

## 2020-08-19 ENCOUNTER — Encounter (HOSPITAL_BASED_OUTPATIENT_CLINIC_OR_DEPARTMENT_OTHER): Payer: Self-pay

## 2020-08-19 ENCOUNTER — Other Ambulatory Visit (HOSPITAL_BASED_OUTPATIENT_CLINIC_OR_DEPARTMENT_OTHER): Payer: Self-pay

## 2020-08-19 ENCOUNTER — Inpatient Hospital Stay (HOSPITAL_BASED_OUTPATIENT_CLINIC_OR_DEPARTMENT_OTHER)
Admission: EM | Admit: 2020-08-19 | Discharge: 2020-08-22 | DRG: 177 | Disposition: A | Discharge status: Home / Self Care | Payer: Medicaid Other | Attending: Internal Medicine | Admitting: Internal Medicine

## 2020-08-19 ENCOUNTER — Emergency Department (HOSPITAL_BASED_OUTPATIENT_CLINIC_OR_DEPARTMENT_OTHER): Payer: Medicaid Other

## 2020-08-19 ENCOUNTER — Other Ambulatory Visit: Payer: Self-pay

## 2020-08-19 DIAGNOSIS — I10 Essential (primary) hypertension: Secondary | ICD-10-CM | POA: Diagnosis present

## 2020-08-19 DIAGNOSIS — Z885 Allergy status to narcotic agent status: Secondary | ICD-10-CM

## 2020-08-19 DIAGNOSIS — E039 Hypothyroidism, unspecified: Secondary | ICD-10-CM | POA: Diagnosis present

## 2020-08-19 DIAGNOSIS — O903 Peripartum cardiomyopathy: Secondary | ICD-10-CM | POA: Diagnosis present

## 2020-08-19 DIAGNOSIS — Z8249 Family history of ischemic heart disease and other diseases of the circulatory system: Secondary | ICD-10-CM

## 2020-08-19 DIAGNOSIS — Z880 Allergy status to penicillin: Secondary | ICD-10-CM

## 2020-08-19 DIAGNOSIS — J1282 Pneumonia due to coronavirus disease 2019: Secondary | ICD-10-CM | POA: Diagnosis present

## 2020-08-19 DIAGNOSIS — Z79899 Other long term (current) drug therapy: Secondary | ICD-10-CM

## 2020-08-19 DIAGNOSIS — J45909 Unspecified asthma, uncomplicated: Secondary | ICD-10-CM | POA: Diagnosis present

## 2020-08-19 DIAGNOSIS — Z6841 Body Mass Index (BMI) 40.0 and over, adult: Secondary | ICD-10-CM

## 2020-08-19 DIAGNOSIS — R778 Other specified abnormalities of plasma proteins: Secondary | ICD-10-CM | POA: Diagnosis present

## 2020-08-19 DIAGNOSIS — R7989 Other specified abnormal findings of blood chemistry: Secondary | ICD-10-CM

## 2020-08-19 DIAGNOSIS — N1832 Chronic kidney disease, stage 3b: Secondary | ICD-10-CM | POA: Diagnosis present

## 2020-08-19 DIAGNOSIS — R079 Chest pain, unspecified: Secondary | ICD-10-CM | POA: Diagnosis present

## 2020-08-19 DIAGNOSIS — G4730 Sleep apnea, unspecified: Secondary | ICD-10-CM | POA: Diagnosis present

## 2020-08-19 DIAGNOSIS — J9601 Acute respiratory failure with hypoxia: Secondary | ICD-10-CM | POA: Diagnosis present

## 2020-08-19 DIAGNOSIS — R0602 Shortness of breath: Secondary | ICD-10-CM

## 2020-08-19 DIAGNOSIS — N179 Acute kidney failure, unspecified: Secondary | ICD-10-CM | POA: Diagnosis present

## 2020-08-19 DIAGNOSIS — Z833 Family history of diabetes mellitus: Secondary | ICD-10-CM

## 2020-08-19 DIAGNOSIS — I129 Hypertensive chronic kidney disease with stage 1 through stage 4 chronic kidney disease, or unspecified chronic kidney disease: Secondary | ICD-10-CM | POA: Diagnosis present

## 2020-08-19 DIAGNOSIS — K219 Gastro-esophageal reflux disease without esophagitis: Secondary | ICD-10-CM | POA: Diagnosis present

## 2020-08-19 DIAGNOSIS — U071 COVID-19: Principal | ICD-10-CM | POA: Diagnosis present

## 2020-08-19 DIAGNOSIS — Z79890 Hormone replacement therapy (postmenopausal): Secondary | ICD-10-CM

## 2020-08-19 DIAGNOSIS — Z888 Allergy status to other drugs, medicaments and biological substances status: Secondary | ICD-10-CM

## 2020-08-19 LAB — TROPONIN I (HIGH SENSITIVITY)
Troponin I (High Sensitivity): 41 ng/L — ABNORMAL HIGH
Troponin I (High Sensitivity): 40 ng/L — ABNORMAL HIGH (ref <18)
Troponin I (High Sensitivity): 26 ng/L — ABNORMAL HIGH (ref <18)

## 2020-08-19 LAB — CBC WITH DIFFERENTIAL/PLATELET
Abs Immature Granulocytes: 0.01 K/uL (ref 0.00–0.07)
Basophils Absolute: 0 K/uL (ref 0.0–0.1)
Basophils Relative: 0 %
Eosinophils Absolute: 0 K/uL (ref 0.0–0.5)
Eosinophils Relative: 0 %
HCT: 41.1 % (ref 36.0–46.0)
Hemoglobin: 12.9 g/dL (ref 12.0–15.0)
Immature Granulocytes: 0 %
Lymphocytes Relative: 27 %
Lymphs Abs: 1.3 K/uL (ref 0.7–4.0)
MCH: 28.2 pg (ref 26.0–34.0)
MCHC: 31.4 g/dL (ref 30.0–36.0)
MCV: 89.7 fL (ref 80.0–100.0)
Monocytes Absolute: 0.3 K/uL (ref 0.1–1.0)
Monocytes Relative: 6 %
Neutro Abs: 3.1 K/uL (ref 1.7–7.7)
Neutrophils Relative %: 67 %
Platelets: 173 K/uL (ref 150–400)
RBC: 4.58 MIL/uL (ref 3.87–5.11)
RDW: 14.6 % (ref 11.5–15.5)
WBC: 4.6 K/uL (ref 4.0–10.5)
nRBC: 0 % (ref 0.0–0.2)

## 2020-08-19 LAB — BASIC METABOLIC PANEL WITH GFR
Anion gap: 11 (ref 5–15)
BUN: 23 mg/dL — ABNORMAL HIGH (ref 6–20)
CO2: 23 mmol/L (ref 22–32)
Calcium: 8.3 mg/dL — ABNORMAL LOW (ref 8.9–10.3)
Chloride: 104 mmol/L (ref 98–111)
Creatinine, Ser: 2.05 mg/dL — ABNORMAL HIGH (ref 0.44–1.00)
GFR, Estimated: 30 mL/min — ABNORMAL LOW
Glucose, Bld: 106 mg/dL — ABNORMAL HIGH (ref 70–99)
Potassium: 4.1 mmol/L (ref 3.5–5.1)
Sodium: 138 mmol/L (ref 135–145)

## 2020-08-19 LAB — BRAIN NATRIURETIC PEPTIDE: B Natriuretic Peptide: 41.1 pg/mL (ref 0.0–100.0)

## 2020-08-19 LAB — APTT: aPTT: 31 seconds (ref 24–36)

## 2020-08-19 LAB — D-DIMER, QUANTITATIVE: D-Dimer, Quant: 0.8 ug/mL-FEU — ABNORMAL HIGH (ref 0.00–0.50)

## 2020-08-19 MED ORDER — ONDANSETRON HCL 4 MG/2ML IJ SOLN
4.0000 mg | Freq: Once | INTRAMUSCULAR | Status: AC
  Administered 2020-08-19: 4 mg via INTRAVENOUS
  Filled 2020-08-19: qty 2

## 2020-08-19 MED ORDER — HEPARIN (PORCINE) 25000 UT/250ML-% IV SOLN
1000.0000 [IU]/h | INTRAVENOUS | Status: DC
  Administered 2020-08-19: 1200 [IU]/h via INTRAVENOUS
  Filled 2020-08-19 (×2): qty 250

## 2020-08-19 MED ORDER — HEPARIN BOLUS VIA INFUSION
4000.0000 [IU] | Freq: Once | INTRAVENOUS | Status: AC
  Administered 2020-08-19: 4000 [IU] via INTRAVENOUS

## 2020-08-19 MED ORDER — HEPARIN SODIUM (PORCINE) 5000 UNIT/ML IJ SOLN
INTRAMUSCULAR | Status: AC
  Filled 2020-08-19: qty 1

## 2020-08-19 NOTE — ED Triage Notes (Signed)
Pt c/o prod cough, fever with +covid 1/14-NAD-steady gait

## 2020-08-19 NOTE — Progress Notes (Signed)
ANTICOAGULATION CONSULT NOTE - Initial Consult  Pharmacy Consult for heparin Indication: chest pain/ACS  Allergies  Allergen Reactions  . Maxalt [Rizatriptan Benzoate] Shortness Of Breath  . Nsaids Other (See Comments)    Renal insufficiency.  . Omeprazole Hives  . Adhesive [Tape] Other (See Comments)    Burns skin  . Hydrocodone Hives  . Penicillins Hives  . Zithromax [Azithromycin] Other (See Comments)    "messes with my breathing"    Patient Measurements: Height: '5\' 6"'$  (167.6 cm) Weight: (!) 141.5 kg (312 lb) IBW/kg (Calculated) : 59.3 Heparin Dosing Weight: 94kg  Vital Signs: Temp: 100.1 F (37.8 C) (01/19 1518) Temp Source: Oral (01/19 1518) BP: 192/95 (01/19 1840) Pulse Rate: 90 (01/19 1840)  Labs: Recent Labs    08/19/20 1726  HGB 12.9  HCT 41.1  PLT 173  CREATININE 2.05*  TROPONINIHS 41*    Estimated Creatinine Clearance: 51 mL/min (A) (by C-G formula based on SCr of 2.05 mg/dL (H)).   Medical History: Past Medical History:  Diagnosis Date  . Asthma   . Complication of anesthesia    woke up during surgery   . Hypertension   . Hypothyroidism   . Migraines   . Renal disorder    stage 3- [facility] - Dr Olivia Mackie  . Renal insufficiency   . Sleep apnea    mild   . Tendonitis   . Thyroid disease    Assessment: 67 YOF presenting with cough/fever, COVID +, workup also for ACS, not on anticoagulation PTA.  CBC wnl.    Goal of Therapy:  Heparin level 0.3-0.7 units/ml Monitor platelets by anticoagulation protocol: Yes   Plan:  Heparin 4000 units IV x 1, and gtt at 1200 units/hr F/u heparin level in 6 hours F/u cards workup  Bertis Ruddy, PharmD Clinical Pharmacist ED Pharmacist Phone # [phone] 08/19/2020 7:39 PM

## 2020-08-19 NOTE — ED Notes (Signed)
Ambulated patient in room with pulse oximeter. SpO2 maintained between 94-100% with ambulation. Pt in NAD.

## 2020-08-19 NOTE — ED Provider Notes (Signed)
[place] EMERGENCY DEPARTMENT Provider Note   CSN: [id number] Arrival date & time: 08/19/20  1508     History Chief Complaint  Patient presents with  . Cough    Dawn Thomas is a 45 y.o. female history of obesity, hypertension, CKD, migraines.  Patient reports that she has been vaccinated x2 against COVID-19 she was getting ready to get her booster shot when she has developed cough, body aches, fever around 1 week ago.  She tested positive for COVID on August 15, 2019.  She reports that over the past 2-3 days she has developed shortness of breath worsened with her cough and ambulation, she describes chest tightness which has been mild constant worsened with cough improved with rest.    Denies headache, neck stiffness, hemoptysis, abdominal pain, nausea/vomiting, diarrhea, extremity swelling/color change or any additional concerns HPI     Past Medical History:  Diagnosis Date  . Asthma   . Complication of anesthesia    woke up during surgery   . Hypertension   . Hypothyroidism   . Migraines   . Renal disorder    stage 3- Dr [facility] - Dr Olivia Mackie  . Renal insufficiency   . Sleep apnea    mild   . Tendonitis   . Thyroid disease     Patient Active Problem List   Diagnosis Date Noted  . COVID-19 virus infection 08/19/2020  . CAP (community acquired pneumonia) 07/11/2018  . Acute respiratory failure with hypoxia ([place]) 07/11/2018  . Multifocal pneumonia 07/11/2018  . Hypertensive urgency 07/11/2018  . Hypothyroidism 07/11/2018  . Acute kidney injury ([place]) 05/31/2017  . Bradycardia 05/31/2017  . Morbid obesity ([place]) 05/30/2017  . Right ankle pain 01/18/2012    Past Surgical History:  Procedure Laterality Date  . CESAREAN SECTION    . CHOLECYSTECTOMY    . DILATION AND CURETTAGE OF UTERUS    . LAPAROSCOPIC GASTRIC SLEEVE RESECTION N/A 05/30/2017   Procedure: LAPAROSCOPIC GASTRIC SLEEVE RESECTION WITH UPPPER ENDO AND HIATAL HERNIA REPAIR;  Surgeon:  Kieth Brightly, Arta Bruce, MD;  Location: [facility] ORS;  Service: General;  Laterality: N/A;     OB History    Gravida  7   Para  1   Term      Preterm      AB  4   Living        SAB  4   IAB      Ectopic      Multiple      Live Births  1           Family History  Problem Relation Age of Onset  . Diabetes Mother   . Hypertension Mother   . Hypertension Father   . Diabetes Father   . Sudden death Father   . Heart attack Neg Hx   . Hyperlipidemia Neg Hx     Social History   Tobacco Use  . Smoking status: Never Smoker  . Smokeless tobacco: Never Used  Vaping Use  . Vaping Use: Never used  Substance Use Topics  . Alcohol use: No  . Drug use: No    Home Medications Prior to Admission medications   Medication Sig Start Date End Date Taking? Authorizing Provider  albuterol (PROVENTIL HFA;VENTOLIN HFA) 108 (90 BASE) MCG/ACT inhaler Inhale 2 puffs into the lungs every 6 (six) hours as needed for wheezing or shortness of breath.     [provider]  amLODipine (NORVASC) 10 MG tablet Take 10 mg by  mouth daily.    [provider]  doxylamine, Sleep, (UNISOM) 25 MG tablet Take 1 tablet (25 mg total) by mouth at bedtime. 01/21/19   Law, Bea Graff, PA-C  furosemide (LASIX) 40 MG tablet Take 1 tablet (40 mg total) by mouth daily. 07/14/18   Domenic Polite, MD  labetalol (NORMODYNE) 100 MG tablet Take 1 tablet (100 mg total) by mouth 2 (two) times daily. 07/14/18   Domenic Polite, MD  levothyroxine (SYNTHROID, LEVOTHROID) 75 MCG tablet Take 75 mcg by mouth daily.     [provider]  Prenatal Vit-Fe Fumarate-FA (PRENATAL MULTIVITAMIN) TABS tablet Take 1 tablet by mouth daily at 12 noon.    [provider]  pyridOXINE (B-6) 50 MG tablet Take 1 tablet (50 mg total) by mouth 3 (three) times daily. 01/21/19   Frederica Kuster, PA-C    Allergies    Maxalt [rizatriptan benzoate], Nsaids, Omeprazole, Adhesive [tape], Hydrocodone, Penicillins,  and Zithromax [azithromycin]  Review of Systems   Review of Systems Ten systems are reviewed and are negative for acute change except as noted in the HPI  Physical Exam Updated Vital Signs BP (!) 192/95   Pulse 90   Temp 100.1 F (37.8 C) (Oral)   Resp (!) 29   Ht '5\' 6"'$  (1.676 m)   Wt (!) 141.5 kg   LMP 07/16/2020   SpO2 96%   Breastfeeding Unknown   BMI 50.36 kg/m   Physical Exam Constitutional:      General: She is not in acute distress.    Appearance: Normal appearance. She is well-developed. She is not ill-appearing or diaphoretic.  HENT:     Head: Normocephalic and atraumatic.  Eyes:     General: Vision grossly intact. Gaze aligned appropriately.     Pupils: Pupils are equal, round, and reactive to light.  Neck:     Trachea: Trachea and phonation normal.  Cardiovascular:     Rate and Rhythm: Normal rate and regular rhythm.     Pulses: Normal pulses.          Dorsalis pedis pulses are 2+ on the right side and 2+ on the left side.  Pulmonary:     Effort: Pulmonary effort is normal. No respiratory distress.     Breath sounds: Normal breath sounds.  Abdominal:     General: There is no distension.     Palpations: Abdomen is soft.     Tenderness: There is no abdominal tenderness. There is no guarding or rebound.  Musculoskeletal:        General: Normal range of motion.     Cervical back: Normal range of motion.     Right lower leg: No edema.     Left lower leg: No edema.  Feet:     Right foot:     Protective Sensation: 3 sites tested. 3 sites sensed.     Left foot:     Protective Sensation: 3 sites tested. 3 sites sensed.  Skin:    General: Skin is warm and dry.  Neurological:     Mental Status: She is alert.     GCS: GCS eye subscore is 4. GCS verbal subscore is 5. GCS motor subscore is 6.     Comments: Speech is clear and goal oriented, follows commands Major Cranial nerves without deficit, no facial droop Moves extremities without ataxia, coordination  intact  Psychiatric:        Behavior: Behavior normal.     ED Results / Procedures / Treatments  Labs (all labs ordered are listed, but only abnormal results are displayed) Labs Reviewed  BASIC METABOLIC PANEL - Abnormal; Notable for the following components:      Result Value   Glucose, Bld 106 (*)    BUN 23 (*)    Creatinine, Ser 2.05 (*)    Calcium 8.3 (*)    GFR, Estimated 30 (*)    All other components within normal limits  D-DIMER, QUANTITATIVE (NOT AT [facility]) - Abnormal; Notable for the following components:   D-Dimer, Quant 0.80 (*)    All other components within normal limits  TROPONIN I (HIGH SENSITIVITY) - Abnormal; Notable for the following components:   Troponin I (High Sensitivity) 41 (*)    All other components within normal limits  CBC WITH DIFFERENTIAL/PLATELET  BRAIN NATRIURETIC PEPTIDE  PREGNANCY, URINE  TROPONIN I (HIGH SENSITIVITY)    EKG EKG Interpretation  Date/Time:  Wednesday August 19 2020 17:11:11 EST Ventricular Rate:  90 PR Interval:  142 QRS Duration: 98 QT Interval:  384 QTC Calculation: 469 R Axis:   -33 Text Interpretation: Normal sinus rhythm Left axis deviation Nonspecific T wave abnormality Prolonged QT Abnormal ECG Confirmed by Lennice Sites [phone]) on 08/19/2020 6:23:18 PM   Radiology DG Chest Portable 1 View  Result Date: 08/19/2020 CLINICAL DATA:  Cough and fever COVID EXAM: PORTABLE CHEST 1 VIEW COMPARISON:  07/13/2018 FINDINGS: Mild cardiomegaly with central congestion. Possible vague peripheral and basilar opacities. No pleural effusion or pneumothorax. IMPRESSION: Cardiomegaly with mild central congestion. Possible vague peripheral and basilar opacities/small foci of pneumonia. Electronically Signed   By: Donavan Foil M.D.   On: 08/19/2020 15:42      Procedures .Critical Care Performed by: Deliah Boston, PA-C Authorized by: Deliah Boston, PA-C   Critical care provider statement:    Critical care time  (minutes):  35   Critical care was time spent personally by me on the following activities:  Discussions with consultants, evaluation of patient's response to treatment, examination of patient, ordering and performing treatments and interventions, ordering and review of laboratory studies, ordering and review of radiographic studies, pulse oximetry, re-evaluation of patient's condition, obtaining history from patient or surrogate, review of old charts and development of treatment plan with patient or surrogate   (including critical care time)  Medications Ordered in ED Medications - No data to display  ED Course  I have reviewed the triage vital signs and the nursing notes.  Pertinent labs & imaging results that were available during my care of the patient were reviewed by me and considered in my medical decision making (see chart for details).  Clinical Course as of 08/19/20 1934  Wed Aug 19, 2020  1848 Dr. Marisue Ivan [BM]    Clinical Course User Index [BM] Gari Crown   MDM Rules/Calculators/A&P                         Additional history obtained from: 1. Nursing notes from this visit. 2. Review of electronic medical records. --------------- I ordered, reviewed and interpreted labs which include: CBC within normal limits, no leukocytosis to suggest bacterial infection, no anemia. BMP shows baseline kidney function, no AKI, no emergent electrolyte abnormalities. BNP within normal limits. D-dimer elevated 0.8 High sensitive troponin elevated at 41, no priors to compare  CXR:  IMPRESSION:  Cardiomegaly with mild central congestion. Possible vague peripheral  and basilar opacities/small foci of pneumonia.   EKG: Normal sinus rhythm Left axis  deviation Nonspecific T wave abnormality Prolonged QT Abnormal ECG Confirmed by Lennice Sites [phone]) on 08/19/2020 6:23:18 PM ------------------- 45 year old female who is vaccinated against COVID presented COVID-positive on day 7-8  of symptoms tested positive at [facility] earlier this week.  She has been experiencing chest pain and shortness of breath.  Patient's troponin level was elevated today, reviewed case with attending physician Dr. Ronnald Nian, plan of care is consultation to cardiology for possible myocarditis in the setting of COVID-19.  D-dimer was also ordered and is elevated but based on patient's kidney function I was informed by radiology technician that the [facility] cannot perform low enough contrast dose to adequately perform PE rule out.  Patient will need transfer to [facility] for that scan.  Also we do not perform V/Q scans at this facility.  I have added on screening DVT studies of the bilateral lower extremities.  Based on history and physical examination low suspicion for dissection at this time. - 6:48 PM: Consult with cardiologist Dr. Marisue Ivan; cardiology recommends admission to medicine service for echocardiogram and further work-up.  Advises EKG is more concerning for LVH over ACS but agrees with starting heparin in the ED. - I then consulted medicine service, spoke with Dr. Tonie Griffith who agrees with starting patient on heparin at this time, patient accepted to hospitalist service.  Patient reassessed she is resting comfortably in bed no acute distress she states understanding of care plan and is agreeable for admission.  Case was discussed with attending physician Dr. Ronnald Nian during this visit who agrees with work-up, heparin and admission.  Note: Portions of this report may have been transcribed using voice recognition software. Every effort was made to ensure accuracy; however, inadvertent computerized transcription errors may still be present. Final Clinical Impression(s) / ED Diagnoses Final diagnoses:  [id number] virus infection  Elevated troponin    Rx / DC Orders ED Discharge Orders    None       Deliah Boston, PA-C 08/19/20 1935    Lennice Sites,  DO 08/19/20 2043

## 2020-08-20 ENCOUNTER — Inpatient Hospital Stay (HOSPITAL_COMMUNITY): Payer: Medicaid Other

## 2020-08-20 ENCOUNTER — Encounter (HOSPITAL_COMMUNITY): Payer: Self-pay | Admitting: Family Medicine

## 2020-08-20 DIAGNOSIS — R079 Chest pain, unspecified: Secondary | ICD-10-CM | POA: Diagnosis present

## 2020-08-20 DIAGNOSIS — Z6841 Body Mass Index (BMI) 40.0 and over, adult: Secondary | ICD-10-CM | POA: Diagnosis not present

## 2020-08-20 DIAGNOSIS — N179 Acute kidney failure, unspecified: Secondary | ICD-10-CM | POA: Diagnosis present

## 2020-08-20 DIAGNOSIS — N1832 Chronic kidney disease, stage 3b: Secondary | ICD-10-CM | POA: Diagnosis present

## 2020-08-20 DIAGNOSIS — I10 Essential (primary) hypertension: Secondary | ICD-10-CM

## 2020-08-20 DIAGNOSIS — Z79899 Other long term (current) drug therapy: Secondary | ICD-10-CM | POA: Diagnosis not present

## 2020-08-20 DIAGNOSIS — J9601 Acute respiratory failure with hypoxia: Secondary | ICD-10-CM

## 2020-08-20 DIAGNOSIS — Z885 Allergy status to narcotic agent status: Secondary | ICD-10-CM | POA: Diagnosis not present

## 2020-08-20 DIAGNOSIS — Z8249 Family history of ischemic heart disease and other diseases of the circulatory system: Secondary | ICD-10-CM | POA: Diagnosis not present

## 2020-08-20 DIAGNOSIS — J45909 Unspecified asthma, uncomplicated: Secondary | ICD-10-CM | POA: Diagnosis present

## 2020-08-20 DIAGNOSIS — G4730 Sleep apnea, unspecified: Secondary | ICD-10-CM | POA: Diagnosis present

## 2020-08-20 DIAGNOSIS — R0602 Shortness of breath: Secondary | ICD-10-CM | POA: Diagnosis not present

## 2020-08-20 DIAGNOSIS — O903 Peripartum cardiomyopathy: Secondary | ICD-10-CM | POA: Diagnosis present

## 2020-08-20 DIAGNOSIS — K219 Gastro-esophageal reflux disease without esophagitis: Secondary | ICD-10-CM | POA: Diagnosis present

## 2020-08-20 DIAGNOSIS — I129 Hypertensive chronic kidney disease with stage 1 through stage 4 chronic kidney disease, or unspecified chronic kidney disease: Secondary | ICD-10-CM | POA: Diagnosis present

## 2020-08-20 DIAGNOSIS — Z880 Allergy status to penicillin: Secondary | ICD-10-CM | POA: Diagnosis not present

## 2020-08-20 DIAGNOSIS — U071 COVID-19: Secondary | ICD-10-CM | POA: Diagnosis not present

## 2020-08-20 DIAGNOSIS — E039 Hypothyroidism, unspecified: Secondary | ICD-10-CM | POA: Diagnosis present

## 2020-08-20 DIAGNOSIS — I371 Nonrheumatic pulmonary valve insufficiency: Secondary | ICD-10-CM | POA: Diagnosis not present

## 2020-08-20 DIAGNOSIS — R778 Other specified abnormalities of plasma proteins: Secondary | ICD-10-CM | POA: Diagnosis present

## 2020-08-20 DIAGNOSIS — J1282 Pneumonia due to coronavirus disease 2019: Secondary | ICD-10-CM | POA: Diagnosis present

## 2020-08-20 DIAGNOSIS — Z833 Family history of diabetes mellitus: Secondary | ICD-10-CM | POA: Diagnosis not present

## 2020-08-20 DIAGNOSIS — Z888 Allergy status to other drugs, medicaments and biological substances status: Secondary | ICD-10-CM | POA: Diagnosis not present

## 2020-08-20 DIAGNOSIS — Z79890 Hormone replacement therapy (postmenopausal): Secondary | ICD-10-CM | POA: Diagnosis not present

## 2020-08-20 HISTORY — DX: Peripartum cardiomyopathy: O90.3

## 2020-08-20 LAB — CBC WITH DIFFERENTIAL/PLATELET
Abs Immature Granulocytes: 0.02 10*3/uL (ref 0.00–0.07)
Basophils Absolute: 0 10*3/uL (ref 0.0–0.1)
Basophils Relative: 0 %
Eosinophils Absolute: 0 10*3/uL (ref 0.0–0.5)
Eosinophils Relative: 0 %
HCT: 39.6 % (ref 36.0–46.0)
Hemoglobin: 11.9 g/dL — ABNORMAL LOW (ref 12.0–15.0)
Immature Granulocytes: 0 %
Lymphocytes Relative: 41 %
Lymphs Abs: 1.9 10*3/uL (ref 0.7–4.0)
MCH: 27.5 pg (ref 26.0–34.0)
MCHC: 30.1 g/dL (ref 30.0–36.0)
MCV: 91.5 fL (ref 80.0–100.0)
Monocytes Absolute: 0.2 10*3/uL (ref 0.1–1.0)
Monocytes Relative: 5 %
Neutro Abs: 2.4 10*3/uL (ref 1.7–7.7)
Neutrophils Relative %: 54 %
Platelets: 165 10*3/uL (ref 150–400)
RBC: 4.33 MIL/uL (ref 3.87–5.11)
RDW: 14.6 % (ref 11.5–15.5)
WBC: 4.5 10*3/uL (ref 4.0–10.5)
nRBC: 0 % (ref 0.0–0.2)

## 2020-08-20 LAB — COMPREHENSIVE METABOLIC PANEL
ALT: 22 U/L (ref 0–44)
AST: 33 U/L (ref 15–41)
Albumin: 3 g/dL — ABNORMAL LOW (ref 3.5–5.0)
Alkaline Phosphatase: 49 U/L (ref 38–126)
Anion gap: 13 (ref 5–15)
BUN: 23 mg/dL — ABNORMAL HIGH (ref 6–20)
CO2: 19 mmol/L — ABNORMAL LOW (ref 22–32)
Calcium: 8 mg/dL — ABNORMAL LOW (ref 8.9–10.3)
Chloride: 107 mmol/L (ref 98–111)
Creatinine, Ser: 2.27 mg/dL — ABNORMAL HIGH (ref 0.44–1.00)
GFR, Estimated: 27 mL/min — ABNORMAL LOW (ref >60)
Glucose, Bld: 84 mg/dL (ref 70–99)
Potassium: 4 mmol/L (ref 3.5–5.1)
Sodium: 139 mmol/L (ref 135–145)
Total Bilirubin: 0.6 mg/dL (ref 0.3–1.2)
Total Protein: 6.4 g/dL — ABNORMAL LOW (ref 6.5–8.1)

## 2020-08-20 LAB — HIV ANTIBODY (ROUTINE TESTING W REFLEX): HIV Screen 4th Generation wRfx: NONREACTIVE

## 2020-08-20 LAB — ECHOCARDIOGRAM LIMITED
Area-P 1/2: 3.12 cm2
Calc EF: 56.7 %
Height: 66 in
S' Lateral: 3.26 cm
Single Plane A2C EF: 54.5 %
Single Plane A4C EF: 59.8 %
Weight: 4945.36 oz

## 2020-08-20 LAB — C-REACTIVE PROTEIN: CRP: 1.6 mg/dL — ABNORMAL HIGH (ref <1.0)

## 2020-08-20 LAB — MAGNESIUM: Magnesium: 1.9 mg/dL (ref 1.7–2.4)

## 2020-08-20 LAB — HEPARIN LEVEL (UNFRACTIONATED): Heparin Unfractionated: 0.79 IU/mL — ABNORMAL HIGH (ref 0.30–0.70)

## 2020-08-20 LAB — D-DIMER, QUANTITATIVE: D-Dimer, Quant: 0.76 ug/mL-FEU — ABNORMAL HIGH (ref 0.00–0.50)

## 2020-08-20 LAB — PROCALCITONIN: Procalcitonin: 0.16 ng/mL

## 2020-08-20 MED ORDER — METHYLPREDNISOLONE SODIUM SUCC 125 MG IJ SOLR
0.5000 mg/kg | Freq: Two times a day (BID) | INTRAMUSCULAR | Status: DC
  Administered 2020-08-20: 70 mg via INTRAVENOUS
  Filled 2020-08-20: qty 2

## 2020-08-20 MED ORDER — GUAIFENESIN-DM 100-10 MG/5ML PO SYRP: 10.0000 mL | ORAL_SOLUTION | ORAL | Status: DC | PRN

## 2020-08-20 MED ORDER — SODIUM CHLORIDE 0.9 % IV SOLN: 100.0000 mg | Freq: Every day | INTRAVENOUS | Status: DC

## 2020-08-20 MED ORDER — NIFEDIPINE ER OSMOTIC RELEASE 60 MG PO TB24
60.0000 mg | ORAL_TABLET | Freq: Two times a day (BID) | ORAL | Status: DC
  Administered 2020-08-20 – 2020-08-22 (×5): 60 mg via ORAL
  Filled 2020-08-20 (×6): qty 1

## 2020-08-20 MED ORDER — FUROSEMIDE 40 MG PO TABS
40.0000 mg | ORAL_TABLET | Freq: Every day | ORAL | Status: DC
  Filled 2020-08-20: qty 1

## 2020-08-20 MED ORDER — SODIUM CHLORIDE 0.9 % IV SOLN: INTRAVENOUS | Status: DC

## 2020-08-20 MED ORDER — IOHEXOL 350 MG/ML SOLN
60.0000 mL | Freq: Once | INTRAVENOUS | Status: AC | PRN
  Administered 2020-08-20: 60 mL via INTRAVENOUS

## 2020-08-20 MED ORDER — ENOXAPARIN SODIUM 60 MG/0.6ML ~~LOC~~ SOLN
60.0000 mg | SUBCUTANEOUS | Status: DC
  Administered 2020-08-20 – 2020-08-21 (×2): 60 mg via SUBCUTANEOUS
  Filled 2020-08-20 (×2): qty 0.6

## 2020-08-20 MED ORDER — HYDRALAZINE HCL 20 MG/ML IJ SOLN: 10.0000 mg | Freq: Four times a day (QID) | INTRAMUSCULAR | Status: DC | PRN

## 2020-08-20 MED ORDER — ASCORBIC ACID 500 MG PO TABS
500.0000 mg | ORAL_TABLET | Freq: Every day | ORAL | Status: DC
  Administered 2020-08-20 – 2020-08-22 (×3): 500 mg via ORAL
  Filled 2020-08-20 (×3): qty 1

## 2020-08-20 MED ORDER — ENOXAPARIN SODIUM 40 MG/0.4ML ~~LOC~~ SOLN
40.0000 mg | SUBCUTANEOUS | Status: DC
Start: 2020-08-20 — End: 2020-08-20

## 2020-08-20 MED ORDER — ENOXAPARIN SODIUM 40 MG/0.4ML ~~LOC~~ SOLN: 40.0000 mg | SUBCUTANEOUS | Status: DC

## 2020-08-20 MED ORDER — LACTATED RINGERS IV SOLN: INTRAVENOUS | Status: DC

## 2020-08-20 MED ORDER — PREDNISONE 20 MG PO TABS: 50.0000 mg | ORAL_TABLET | Freq: Every day | ORAL | Status: DC

## 2020-08-20 MED ORDER — LEVOTHYROXINE SODIUM 75 MCG PO TABS
75.0000 ug | ORAL_TABLET | Freq: Every day | ORAL | Status: DC
  Administered 2020-08-20 – 2020-08-22 (×3): 75 ug via ORAL
  Filled 2020-08-20 (×3): qty 1

## 2020-08-20 MED ORDER — ALBUTEROL SULFATE HFA 108 (90 BASE) MCG/ACT IN AERS
2.0000 | INHALATION_SPRAY | RESPIRATORY_TRACT | Status: DC | PRN
  Filled 2020-08-20: qty 6.7

## 2020-08-20 MED ORDER — LABETALOL HCL 200 MG PO TABS: 100.0000 mg | ORAL_TABLET | Freq: Two times a day (BID) | ORAL | Status: DC

## 2020-08-20 MED ORDER — ACETAMINOPHEN 325 MG PO TABS
650.0000 mg | ORAL_TABLET | Freq: Four times a day (QID) | ORAL | Status: DC | PRN
  Administered 2020-08-20 – 2020-08-21 (×2): 650 mg via ORAL
  Filled 2020-08-20 (×2): qty 2

## 2020-08-20 MED ORDER — ZINC SULFATE 220 (50 ZN) MG PO CAPS
220.0000 mg | ORAL_CAPSULE | Freq: Every day | ORAL | Status: DC
  Administered 2020-08-20 – 2020-08-22 (×3): 220 mg via ORAL
  Filled 2020-08-20 (×3): qty 1

## 2020-08-20 MED ORDER — LOSARTAN POTASSIUM 50 MG PO TABS
100.0000 mg | ORAL_TABLET | Freq: Every day | ORAL | Status: DC
  Administered 2020-08-20 – 2020-08-21 (×2): 100 mg via ORAL
  Filled 2020-08-20 (×2): qty 2

## 2020-08-20 MED ORDER — PANTOPRAZOLE SODIUM 40 MG PO TBEC
40.0000 mg | DELAYED_RELEASE_TABLET | Freq: Every day | ORAL | Status: DC
  Administered 2020-08-20 – 2020-08-22 (×3): 40 mg via ORAL
  Filled 2020-08-20 (×3): qty 1

## 2020-08-20 MED ORDER — DOXYLAMINE SUCCINATE (SLEEP) 25 MG PO TABS
25.0000 mg | ORAL_TABLET | Freq: Every day | ORAL | Status: DC
  Filled 2020-08-20 (×3): qty 1

## 2020-08-20 MED ORDER — POLYETHYLENE GLYCOL 3350 17 G PO PACK: 17.0000 g | PACK | Freq: Every day | ORAL | Status: DC | PRN

## 2020-08-20 MED ORDER — SODIUM CHLORIDE 0.9 % IV SOLN
200.0000 mg | Freq: Once | INTRAVENOUS | Status: DC
  Filled 2020-08-20: qty 40

## 2020-08-20 MED ORDER — ONDANSETRON HCL 4 MG/2ML IJ SOLN
4.0000 mg | Freq: Four times a day (QID) | INTRAMUSCULAR | Status: DC | PRN
  Administered 2020-08-21: 4 mg via INTRAVENOUS
  Filled 2020-08-20: qty 2

## 2020-08-20 MED ORDER — ONDANSETRON HCL 4 MG PO TABS: 4.0000 mg | ORAL_TABLET | Freq: Four times a day (QID) | ORAL | Status: DC | PRN

## 2020-08-20 MED ORDER — SERTRALINE HCL 50 MG PO TABS
50.0000 mg | ORAL_TABLET | Freq: Every day | ORAL | Status: DC
  Administered 2020-08-20 – 2020-08-22 (×3): 50 mg via ORAL
  Filled 2020-08-20 (×3): qty 1

## 2020-08-20 NOTE — H&P (Signed)
History and Physical    Dawn Thomas [id number] DOB: [birth year] DOA: 08/19/2020  PCP: Virginia Rochester, PA  Patient coming from: Home   Chief Complaint:  Chief Complaint  Patient presents with  . 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  Patient explains that for approximately the past week she has been experiencing cough. Cough is mostly nonproductive. Approximately the same time, patient began to experience generalized weakness, malaise and somewhat decreased appetite. Patient denies any associated diarrhea. Patient does complain of associated intermittent fevers however. Patient denies recent travel, sick contacts or recent contact with confirmed COVID-19 infection. Patient denies significant shortness of breath.  Patient presented to a local [facility] pharmacy on 1/14 where she was tested for COVID-19 and found to be positive. Patient was instructed to go home and manage her disease conservatively. Unfortunately, in the days that followed the patient developed progressively worsening symptoms. Patient also began to develop associated chest discomfort. Approximately 3 days prior to arrival patient has also began to experience intermittent chest discomfort. Patient describes this chest discomfort as heaviness, located in the midsternal region. Chest discomfort is nonradiating. Patient denies associated diaphoresis. Discomfort does not seem to be associated with exertion.  Because of this progressive constellation of symptoms the patient eventually presented to [place] emergency department for evaluation. Patient was found to have a somewhat elevated troponin of 41. Complaints of shortness of breath  and chest pain concerning for worsening COVID infection in addition to ongoing chest discomfort concerning for possible underlying pericarditis or an acute ischemic event the case was discussed with Dr. Audie Box with cardiology. Dr. Audie Box recommended initiation of heparin drip, admission to the hospital service and consultation of cardiology in the morning. The hospitalist group accepted the patient to [facility] main campus for further management.  Review of Systems:   Review of Systems  Respiratory: Positive for cough and shortness of breath.   Cardiovascular: Positive for chest pain.  Neurological: Positive for weakness.  All other systems reviewed and are negative.   Past Medical History:  Diagnosis Date  . Asthma   . Complication of anesthesia    woke up during surgery   . Hypertension   . Hypothyroidism   . Migraines   . 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 Renal disorder    stage 3- Dr [facility] - Dr Olivia Mackie  . Renal insufficiency   . Sleep apnea    mild   . Tendonitis   . Thyroid disease     Past Surgical History:  Procedure Laterality Date  . CESAREAN SECTION    . CHOLECYSTECTOMY    . DILATION AND CURETTAGE OF UTERUS    . LAPAROSCOPIC GASTRIC SLEEVE RESECTION N/A 05/30/2017   Procedure: LAPAROSCOPIC GASTRIC SLEEVE RESECTION WITH UPPPER ENDO AND HIATAL HERNIA REPAIR;  Surgeon: Kieth Brightly, Arta Bruce, MD;  Location: [facility] ORS;  Service: General;  Laterality: N/A;     reports that she has never smoked. She has never used smokeless tobacco. She reports that she does not drink alcohol and does not use drugs.  Allergies  Allergen Reactions  . Maxalt [Rizatriptan Benzoate] Shortness Of Breath  . Nsaids Other (See Comments)    Renal insufficiency.  . Omeprazole Hives  . Adhesive [Tape] Other (See Comments)  Burns skin  . Hydrocodone Hives  . Penicillins Hives  . Zithromax [Azithromycin] Other (See Comments)    "messes with my breathing"    Family  History  Problem Relation Age of Onset  . Diabetes Mother   . Hypertension Mother   . Hypertension Father   . Diabetes Father   . Sudden death Father   . Heart attack Neg Hx   . Hyperlipidemia Neg Hx      Prior to Admission medications   Medication Sig Start Date End Date Taking? Authorizing Provider  acetaminophen (TYLENOL) 500 MG tablet Take 1,000 mg by mouth every 6 (six) hours as needed for mild pain.   Yes [provider]  albuterol (PROVENTIL HFA;VENTOLIN HFA) 108 (90 BASE) MCG/ACT inhaler Inhale 2 puffs into the lungs every 6 (six) hours as needed for wheezing or shortness of breath.    Yes [provider]  Ascorbic Acid (VITAMIN C) 1000 MG tablet Take 1,000 mg by mouth daily.   Yes [provider]  Ergocalciferol 50 MCG (2000 UT) TABS Take 2,000 Units by mouth daily. 09/04/19  Yes [provider]  levothyroxine (SYNTHROID, LEVOTHROID) 75 MCG tablet Take 75 mcg by mouth daily.    Yes [provider]  losartan (COZAAR) 100 MG tablet Take 100 mg by mouth daily. 08/08/20  Yes [provider]  NIFEdipine (ADALAT CC) 60 MG 24 hr tablet Take 60 mg by mouth 2 (two) times daily. 07/24/20  Yes [provider]  pantoprazole (PROTONIX) 40 MG tablet Take 40 mg by mouth daily. 04/27/20  Yes [provider]  Prenatal Vit-Fe Fumarate-FA (PRENATAL MULTIVITAMIN) TABS tablet Take 1 tablet by mouth daily at 12 noon.   Yes [provider]  sertraline (ZOLOFT) 50 MG tablet Take 50 mg by mouth daily. 04/27/20  Yes [provider]  zinc gluconate 50 MG tablet Take 50 mg by mouth daily.   Yes [provider]  doxylamine, Sleep, (UNISOM) 25 MG tablet Take 1 tablet (25 mg total) by mouth at bedtime. Patient not taking: Reported on 08/20/2020 01/21/19   Frederica Kuster, PA-C  furosemide (LASIX) 40 MG tablet Take 1 tablet (40 mg total) by mouth daily. Patient not taking: Reported on 08/20/2020 07/14/18   Domenic Polite, MD  labetalol (NORMODYNE) 100 MG tablet Take 1 tablet (100 mg total) by mouth 2 (two) times daily. Patient not taking: Reported on 08/20/2020 07/14/18   Domenic Polite, MD  pyridOXINE (B-6) 50 MG tablet Take 1 tablet (50 mg total) by mouth 3 (three) times daily. Patient not taking: Reported on 08/20/2020 01/21/19   Frederica Kuster, PA-C    Physical Exam: Vitals:   08/20/20 0553 08/20/20 0700 08/20/20 1145 08/20/20 1311  BP:  (!) 161/83 (!) 148/84   Pulse: 83 85 75   Resp:  18 18   Temp:  (!) 100.5 F (38.1 C) 98.5 F (36.9 C) 100.2 F (37.9 C)  TempSrc:  Oral Oral Oral  SpO2: 92%  96%   Weight:      Height:        Constitutional: Patient is lethargic but arousable and oriented x3, no associated distress.   Skin: no rashes, no lesions, good skin turgor noted. Eyes: Pupils are equally reactive to light.  No evidence of scleral icterus or conjunctival pallor.  ENMT: Moist mucous membranes noted.  Posterior pharynx clear of any exudate or lesions.   Neck: normal, supple, no masses, no thyromegaly.  No evidence of jugular venous  distension.   Respiratory: Faint bibasilar rales, no evidence of wheezing, no crackles. Normal respiratory effort. No accessory muscle use.  Cardiovascular: Regular rate and rhythm, no murmurs / rubs / gallops. No extremity edema. 2+ pedal pulses. No carotid bruits.  Chest:   Nontender without crepitus or deformity.   Back:   Nontender without crepitus or deformity. Abdomen: Abdomen is soft and nontender.  No evidence of intra-abdominal masses.  Positive bowel sounds noted in all quadrants.   Musculoskeletal: No joint deformity upper and lower extremities. Good ROM, no contractures. Normal muscle tone.  Neurologic: CN 2-12 grossly intact. Sensation intact.  Patient moving all 4 extremities spontaneously.  Patient is following all commands.  Patient is responsive to verbal stimuli.   Psychiatric: Patient exhibits normal mood with appropriate affect.   Patient seems to possess insight as to their current situation.     Labs on Admission: I have personally reviewed following labs and imaging studies -   CBC: Recent Labs  Lab 08/19/20 1726 08/20/20 0913  WBC 4.6 4.5  NEUTROABS 3.1 2.4  HGB 12.9 11.9*  HCT 41.1 39.6  MCV 89.7 91.5  PLT 173 [id number]   Basic Metabolic Panel: Recent Labs  Lab 08/19/20 1726 08/20/20 0913  NA 138 139  K 4.1 4.0  CL 104 107  CO2 23 19*  GLUCOSE 106* 84  BUN 23* 23*  CREATININE 2.05* 2.27*  CALCIUM 8.3* 8.0*  MG  --  1.9   GFR: Estimated Creatinine Clearance: 45.8 mL/min (A) (by C-G formula based on SCr of 2.27 mg/dL (H)). Liver Function Tests: Recent Labs  Lab 08/20/20 0913  AST 33  ALT 22  ALKPHOS 49  BILITOT 0.6  PROT 6.4*  ALBUMIN 3.0*   No results for input(s): LIPASE, AMYLASE in the last 168 hours. No results for input(s): AMMONIA in the last 168 hours. Coagulation Profile: No results for input(s): INR, PROTIME in the last 168 hours. Cardiac Enzymes: No results for input(s): CKTOTAL, CKMB, CKMBINDEX, TROPONINI in the last 168 hours. BNP (last 3 results) No results for input(s): PROBNP in the last 8760 hours. HbA1C: No results for input(s): HGBA1C in the last 72 hours. CBG: No results for input(s): GLUCAP in the last 168 hours. Lipid Profile: No results for input(s): CHOL, HDL, LDLCALC, TRIG, CHOLHDL, LDLDIRECT in the last 72 hours. Thyroid Function Tests: No results for input(s): TSH, T4TOTAL, FREET4, T3FREE, THYROIDAB in the last 72 hours. Anemia Panel: No results for input(s): VITAMINB12, FOLATE, FERRITIN, TIBC, IRON, RETICCTPCT in the last 72 hours. Urine analysis:    Component Value Date/Time   COLORURINE YELLOW 01/21/2019 [place] 01/21/2019 1633   LABSPEC 1.020 01/21/2019 1633   PHURINE 6.0 01/21/2019 1633   GLUCOSEU NEGATIVE 01/21/2019 1633   HGBUR NEGATIVE 01/21/2019 1633   BILIRUBINUR NEGATIVE 01/21/2019 1633   KETONESUR NEGATIVE 01/21/2019  1633   PROTEINUR 100 (A) 01/21/2019 1633   UROBILINOGEN 1.0 10/04/2013 2130   NITRITE NEGATIVE 01/21/2019 1633   LEUKOCYTESUR NEGATIVE 01/21/2019 1633    Radiological Exams on Admission - Personally Reviewed: CT Angio Chest PE W and/or Wo Contrast  Result Date: 08/20/2020 CLINICAL DATA:  Productive cough and fever. Positive D-dimer. History of sleep apnea. EXAM: CT ANGIOGRAPHY CHEST WITH CONTRAST TECHNIQUE: Multidetector CT imaging of the chest was performed using the standard protocol during bolus administration of intravenous contrast. Multiplanar CT image reconstructions and MIPs were obtained to evaluate the vascular anatomy. CONTRAST:  29m OMNIPAQUE IOHEXOL 350 MG/ML SOLN COMPARISON:  CT  dated July 11, 2018. FINDINGS: Cardiovascular: Examination is limited by respiratory motion artifact and patient body habitus. There is no pulmonary embolus or evidence of right heart strain. The size of the main pulmonary artery is normal. Cardiomegaly with coronary artery calcification. The course and caliber of the aorta are normal. There is mild atherosclerotic calcification. Opacification decreased due to pulmonary arterial phase contrast bolus timing. Mediastinum/Nodes: -- No mediastinal lymphadenopathy. -- No hilar lymphadenopathy. -- No axillary lymphadenopathy. -- No supraclavicular lymphadenopathy. -- Normal thyroid gland where visualized. -  Unremarkable esophagus. Lungs/Pleura: Ground-glass airspace opacities are noted throughout both lung fields. There is no pneumothorax. The trachea is unremarkable. There is a trace left-sided pleural effusion. Upper Abdomen: Contrast bolus timing is not optimized for evaluation of the abdominal organs. The visualized portions of the organs of the upper abdomen are normal. Musculoskeletal: No chest wall abnormality. No bony spinal canal stenosis. Review of the MIP images confirms the above findings. IMPRESSION: 1. Examination is limited by respiratory motion  artifact and patient body habitus. Given this limitation, there is no evidence for pulmonary embolus. 2. Ground-glass airspace opacities are noted throughout both lung fields, concerning for an atypical infectious process such as viral pneumonia. 3. Trace left-sided pleural effusion. 4. Cardiomegaly with coronary artery disease. Aortic Atherosclerosis (ICD10-I70.0). Electronically Signed   By: Constance Holster M.D.   On: 08/20/2020 06:52   US Venous Img Lower Bilateral  Result Date: 08/19/2020 CLINICAL DATA:  COVID positive with shortness of breath. EXAM: BILATERAL LOWER EXTREMITY VENOUS DOPPLER ULTRASOUND TECHNIQUE: Gray-scale sonography with compression, as well as color and duplex ultrasound, were performed to evaluate the deep venous system(s) from the level of the common femoral vein through the popliteal and proximal calf veins. COMPARISON:  None. FINDINGS: VENOUS Normal compressibility of the common femoral, superficial femoral, and popliteal veins, as well as the visualized calf veins. Visualized portions of profunda femoral vein and great saphenous vein unremarkable. No filling defects to suggest DVT on grayscale or color Doppler imaging. Doppler waveforms show normal direction of venous flow, normal respiratory plasticity and response to augmentation. Limited views of the contralateral common femoral vein are unremarkable. OTHER None. Limitations: none IMPRESSION: Negative. Electronically Signed   By: Virgina Norfolk M.D.   On: 08/19/2020 19:56   DG Chest Portable 1 View  Result Date: 08/19/2020 CLINICAL DATA:  Cough and fever COVID EXAM: PORTABLE CHEST 1 VIEW COMPARISON:  07/13/2018 FINDINGS: Mild cardiomegaly with central congestion. Possible vague peripheral and basilar opacities. No pleural effusion or pneumothorax. IMPRESSION: Cardiomegaly with mild central congestion. Possible vague peripheral and basilar opacities/small foci of pneumonia. Electronically Signed   By: Donavan Foil M.D.    On: 08/19/2020 15:42    EKG: Personally reviewed.  Rhythm is normal sinus rhythm with heart rate of 83 bpm. Notable T wave inversions in leads V5 and V6 no dynamic ST segment changes appreciated.  Assessment/Plan Principal Problem:   COVID-19 virus infection   Patient presenting with several days of progressively worsening cough, malaise and weakness with associated chest discomfort  While patient did exhibit episode of hypoxia shortly after arrival to the medical floor, I have removed patient supplemental oxygen and she is currently maintaining saturations above 94%.  We'll not initiate remdesivir or steroids at this time unless patient develop saturations less than 94%.  Very gentle intravenous hydration due to poor oral intake over the past several days  As needed bronchodilator therapy for shortness of breath and wheezing  As needed antitussives  Daily vitamin  C and zinc supplementation  Droplet and contact isolation  Active Problems: Chest pain   Ongoing chest discomfort in the setting of slightly elevated troponin and active COVID-19 infection  I personally believe that slightly elevated troponin may very well be supply demand mismatch secondary to underlying COVID infection  Other possibilities include ischemic chest pain, pericarditis or myocarditis which are much less likely  Cardiology has already been consulted by the emergency department provider, and recommended a heparin infusion for now which we will maintain  Will await further recommendations from cardiology.  Their assistance is appreciated.  Will continue heparin infusion initiated by emergency department staff for now  Awaiting echocardiogram  Monitoring patient on telemetry  Of note, patient does have a known history of peripartum cardiomyopathy with a last documented ejection fraction returning to normal.  Elevated troponin   Please see assessment and plan above    Essential  hypertension   Continue home regimen of antihypertensive therapy    Chronic kidney disease, stage 3b (HCC)    Creatinine is near baseline  Strict input and output monitoring  Monitoring renal function and electrolytes with serial chemistries  Avoiding nephrotoxic agents if at all possible   Code Status:  Full code Family Communication: deferred   Status is: Inpatient  Remains inpatient appropriate because:Ongoing diagnostic testing needed not appropriate for outpatient work up, IV treatments appropriate due to intensity of illness or inability to take PO and Inpatient level of care appropriate due to severity of illness   Dispo: The patient is from: Home              Anticipated d/c is to: Home              Anticipated d/c date is: > 3 days              Patient currently is not medically stable to d/c.        Vernelle Emerald MD [facility] Pager [phone]  If 7PM-7AM, please contact night-coverage [email or web address] Use universal [place] password for that web site. If you do not have the password, please call the hospital operator.  08/20/2020, 2:54 PM

## 2020-08-20 NOTE — Progress Notes (Signed)
Monitor tech notified patient oxygen saturation sustained 84%.  Patient checked and found to be asleep.  Placed on 2L [place] with oxygen saturation increasing above 92%.

## 2020-08-20 NOTE — ED Notes (Signed)
Patient is resting comfortably. 

## 2020-08-20 NOTE — Consult Note (Addendum)
Cardiology Consultation:   Patient ID: Dawn Thomas MRN: [id number]; DOB: April 03, 1976  Admit date: 08/19/2020 Date of Consult: 08/20/2020  Primary Care Provider: Virginia Rochester, [facility] Cardiologist: Werner Lean, MD - [facility], previously with [place] Electrophysiologist:  None    Patient Profile:   Dawn Thomas is a 45 y.o. female with a hx of obesity, CKD stage III, HTN, and migraines who is being seen today for the evaluation of chest pain at the request of Dr. Cyd Silence.  History of Present Illness:   Ms. Tse had an echocardiogram 05/2017 that showed an EF 55%, mild AR and mild MR. 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 She was [redacted] weeks pregnant and admitted for CHF exacerbation. EF at that time was 45-50% (07/12/18). Unfortunately she miscarried shortly before that echo. She was lost to follow up. She was seen back in cardiology clinic 06/13/19 and was [redacted] weeks pregnant. Follow up echocardiogram 06/2019 showed EF 55-65%, but new mild to moderate aortic valve regurgitation. She has not seen cardiology since. She denies history of MI or PCI. No history of stroke.   She tested positive for COVID-19 on 08/14/20. She presented to [facility] ED with 1 week hx of cough, body aches, and fever, and 2-3 day history of shortness of breath and DOE with chest tightness. She is vaccinated, but not boosted.  D-dimer was elevated, but CT scanner was unable to perform low dose contrast to rule out PE. She was transferred to [facility] for PE evaluation and for cardiology consult for chest pain and possible myocarditis.   HS troponin mildly elevated: 41 --> 40 --> 26 EKG with ST elevation in anterior leads suspicious for LVH Heparin drip was started.  BNP 282.5 D-dimer 0.80 CRP pending  After arrival, CTA was limited but no definite PE found, did note CAD. Lower extremity US also negative for DVT.    She reports chest heaviness, like elephant  sitting on her chest, when she was lying down - started yesterday afternoon. CP rated 3-4/10 and did not radiate. Unclear if CP was relieved upon sitting up. She took her inhaler and felt like her lungs "were floating." CP was relieved after taking her inhaler. CP has not returned.   She had a gastric sleeve done in 2018 and has lost weight, used to weight 410 lbs, now 316 lbs. She delivered a baby Aug 27 2019. She has 2 children at home. She works as a Radiation protection practitioner - desk work. She does not exercise but does work as an Dealer delivery person - she can achieve 4.0 METS without chest pain prior to this admission.  She is a never smoker and does not have a family history of heart disease. Her 46 yo daughter was diagnosed with lupus last year.    Past Medical History:  Diagnosis Date  . Asthma   . Complication of anesthesia    woke up during surgery   . Hypertension   . Hypothyroidism   . Migraines   . 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 Renal disorder    stage 3- Dr [facility] - Dr Olivia Mackie  . Renal insufficiency   . Sleep apnea    mild   . Tendonitis   . Thyroid disease     Past Surgical History:  Procedure Laterality Date  . CESAREAN SECTION    . CHOLECYSTECTOMY    . DILATION AND CURETTAGE OF UTERUS    . LAPAROSCOPIC GASTRIC SLEEVE  RESECTION N/A 05/30/2017   Procedure: LAPAROSCOPIC GASTRIC SLEEVE RESECTION WITH UPPPER ENDO AND HIATAL HERNIA REPAIR;  Surgeon: Kieth Brightly, Arta Bruce, MD;  Location: [facility];  Service: General;  Laterality: N/A;     Home Medications:  Prior to Admission medications   Medication Sig Start Date End Date Taking? Authorizing Provider  acetaminophen (TYLENOL) 500 MG tablet Take 1,000 mg by mouth every 6 (six) hours as needed for mild pain.   Yes [provider]  albuterol (PROVENTIL HFA;VENTOLIN HFA) 108 (90 BASE) MCG/ACT inhaler Inhale 2 puffs into the lungs every 6 (six) hours as needed for wheezing or shortness of  breath.    Yes [provider]  Ascorbic Acid (VITAMIN C) 1000 MG tablet Take 1,000 mg by mouth daily.   Yes [provider]  Ergocalciferol 50 MCG (2000 UT) TABS Take 2,000 Units by mouth daily. 09/04/19  Yes [provider]  levothyroxine (SYNTHROID, LEVOTHROID) 75 MCG tablet Take 75 mcg by mouth daily.    Yes [provider]  losartan (COZAAR) 100 MG tablet Take 100 mg by mouth daily. 08/08/20  Yes [provider]  NIFEdipine (ADALAT CC) 60 MG 24 hr tablet Take 60 mg by mouth 2 (two) times daily. 07/24/20  Yes [provider]  pantoprazole (PROTONIX) 40 MG tablet Take 40 mg by mouth daily. 04/27/20  Yes [provider]  Prenatal Vit-Fe Fumarate-FA (PRENATAL MULTIVITAMIN) TABS tablet Take 1 tablet by mouth daily at 12 noon.   Yes [provider]  sertraline (ZOLOFT) 50 MG tablet Take 50 mg by mouth daily. 04/27/20  Yes [provider]  zinc gluconate 50 MG tablet Take 50 mg by mouth daily.   Yes [provider]  doxylamine, Sleep, (UNISOM) 25 MG tablet Take 1 tablet (25 mg total) by mouth at bedtime. Patient not taking: Reported on 08/20/2020 01/21/19   Frederica Kuster, PA-C  furosemide (LASIX) 40 MG tablet Take 1 tablet (40 mg total) by mouth daily. Patient not taking: Reported on 08/20/2020 07/14/18   Domenic Polite, MD  labetalol (NORMODYNE) 100 MG tablet Take 1 tablet (100 mg total) by mouth 2 (two) times daily. Patient not taking: Reported on 08/20/2020 07/14/18   Domenic Polite, MD  pyridOXINE (B-6) 50 MG tablet Take 1 tablet (50 mg total) by mouth 3 (three) times daily. Patient not taking: Reported on 08/20/2020 01/21/19   Frederica Kuster, PA-C    Inpatient Medications: Scheduled Meds: . vitamin C  500 mg Oral Daily  . doxylamine (Sleep)  25 mg Oral QHS  . levothyroxine  75 mcg Oral Daily  . losartan  100 mg Oral Daily  . NIFEdipine  60 mg Oral BID  . pantoprazole  40 mg Oral Daily  . sertraline  50  mg Oral Daily  . zinc sulfate  220 mg Oral Daily   Continuous Infusions: . sodium chloride    . heparin 1,000 Units/hr (08/20/20 1206)   PRN Meds: acetaminophen, albuterol, guaiFENesin-dextromethorphan, hydrALAZINE, ondansetron **OR** ondansetron (ZOFRAN) IV, polyethylene glycol  Allergies:    Allergies  Allergen Reactions  . Maxalt [Rizatriptan Benzoate] Shortness Of Breath  . Nsaids Other (See Comments)    Renal insufficiency.  . Omeprazole Hives  . Adhesive [Tape] Other (See Comments)    Burns skin  . Hydrocodone Hives  . Penicillins Hives  . Zithromax [Azithromycin] Other (See Comments)    "messes with my breathing"    Social History:   Social History   Socioeconomic History  . Marital  status: Single    Spouse name: Not on file  . Number of children: Not on file  . Years of education: Not on file  . Highest education level: Not on file  Occupational History  . Not on file  Tobacco Use  . Smoking status: Never Smoker  . Smokeless tobacco: Never Used  Vaping Use  . Vaping Use: Never used  Substance and Sexual Activity  . Alcohol use: No  . Drug use: No  . Sexual activity: Not on file  Other Topics Concern  . Not on file  Social History Narrative  . Not on file   Social Determinants of Health   Financial Resource Strain: Not on file  Food Insecurity: Not on file  Transportation Needs: Not on file  Physical Activity: Not on file  Stress: Not on file  Social Connections: Not on file  Intimate Partner Violence: Not on file    Family History:    Family History  Problem Relation Age of Onset  . Diabetes Mother   . Hypertension Mother   . Hypertension Father   . Diabetes Father   . Sudden death Father   . Heart attack Neg Hx   . Hyperlipidemia Neg Hx      ROS:  Please see the history of present illness.  All other ROS reviewed and negative.     Physical Exam/Data:   Vitals:   08/20/20 0547 08/20/20 0553 08/20/20 0700 08/20/20 1145  BP:   (!)  161/83 (!) 148/84  Pulse: 73 83 85 75  Resp:   18 18  Temp:   (!) 100.5 F (38.1 C) 98.5 F (36.9 C)  TempSrc:   Oral Oral  SpO2: (!) 85% 92%  96%  Weight:      Height:        Intake/Output Summary (Last 24 hours) at 08/20/2020 1227 Last data filed at 08/20/2020 1206 Gross per 24 hour  Intake 437.69 ml  Output -  Net 437.69 ml   Last 3 Weights 08/20/2020 08/19/2020 01/21/2019  Weight (lbs) 309 lb 1.4 oz 312 lb 307 lb  Weight (kg) 140.2 kg 141.522 kg 139.254 kg     Body mass index is 49.89 kg/m.  General:  Obese female in NAD Cardiac:  Regular rate and rhythm no murmurs rubs or gallops Lungs:  Respirations unlabored, but cough present, congestion  Neuro:  No focal deficits appreciated MSK: minimal non pitting edema Psych:  Normal affect   EKG:  The EKG was personally reviewed and demonstrates:  Sinus rhythm HR 90, ST elevation anterior leads suspicious for LVH Telemetry:  Telemetry was personally reviewed and demonstrates:  Sinus rhythm  Relevant CV Studies:  Echo pending  Laboratory Data:  High Sensitivity Troponin:   Recent Labs  Lab 08/19/20 1726 08/19/20 1932 08/19/20 2158  TROPONINIHS 41* 40* 26*     Chemistry Recent Labs  Lab 08/19/20 1726  NA 138  K 4.1  CL 104  CO2 23  GLUCOSE 106*  BUN 23*  CREATININE 2.05*  CALCIUM 8.3*  GFRNONAA 30*  ANIONGAP 11    No results for input(s): PROT, ALBUMIN, AST, ALT, ALKPHOS, BILITOT in the last 168 hours. Hematology Recent Labs  Lab 08/19/20 1726  WBC 4.6  RBC 4.58  HGB 12.9  HCT 41.1  MCV 89.7  MCH 28.2  MCHC 31.4  RDW 14.6  PLT 173   BNP Recent Labs  Lab 08/19/20 1726  BNP 41.1    DDimer  Recent [employer]  08/19/20 1818 08/20/20 0913  DDIMER 0.80* 0.76*     Radiology/Studies:  CT Angio Chest PE W and/or Wo Contrast  Result Date: 08/20/2020 CLINICAL DATA:  Productive cough and fever. Positive D-dimer. History of sleep apnea. EXAM: CT ANGIOGRAPHY CHEST WITH CONTRAST TECHNIQUE:  Multidetector CT imaging of the chest was performed using the standard protocol during bolus administration of intravenous contrast. Multiplanar CT image reconstructions and MIPs were obtained to evaluate the vascular anatomy. CONTRAST:  47m OMNIPAQUE IOHEXOL 350 MG/ML SOLN COMPARISON:  CT dated July 11, 2018. FINDINGS: Cardiovascular: Examination is limited by respiratory motion artifact and patient body habitus. There is no pulmonary embolus or evidence of right heart strain. The size of the main pulmonary artery is normal. Cardiomegaly with coronary artery calcification. The course and caliber of the aorta are normal. There is mild atherosclerotic calcification. Opacification decreased due to pulmonary arterial phase contrast bolus timing. Mediastinum/Nodes: -- No mediastinal lymphadenopathy. -- No hilar lymphadenopathy. -- No axillary lymphadenopathy. -- No supraclavicular lymphadenopathy. -- Normal thyroid gland where visualized. -  Unremarkable esophagus. Lungs/Pleura: Ground-glass airspace opacities are noted throughout both lung fields. There is no pneumothorax. The trachea is unremarkable. There is a trace left-sided pleural effusion. Upper Abdomen: Contrast bolus timing is not optimized for evaluation of the abdominal organs. The visualized portions of the organs of the upper abdomen are normal. Musculoskeletal: No chest wall abnormality. No bony spinal canal stenosis. Review of the MIP images confirms the above findings. IMPRESSION: 1. Examination is limited by respiratory motion artifact and patient body habitus. Given this limitation, there is no evidence for pulmonary embolus. 2. Ground-glass airspace opacities are noted throughout both lung fields, concerning for an atypical infectious process such as viral pneumonia. 3. Trace left-sided pleural effusion. 4. Cardiomegaly with coronary artery disease. Aortic Atherosclerosis (ICD10-I70.0). Electronically Signed   By: CConstance HolsterM.D.   On:  08/20/2020 06:52   UKoreaVenous Img Lower Bilateral  Result Date: 08/19/2020 CLINICAL DATA:  COVID positive with shortness of breath. EXAM: BILATERAL LOWER EXTREMITY VENOUS DOPPLER ULTRASOUND TECHNIQUE: Gray-scale sonography with compression, as well as color and duplex ultrasound, were performed to evaluate the deep venous system(s) from the level of the common femoral vein through the popliteal and proximal calf veins. COMPARISON:  None. FINDINGS: VENOUS Normal compressibility of the common femoral, superficial femoral, and popliteal veins, as well as the visualized calf veins. Visualized portions of profunda femoral vein and great saphenous vein unremarkable. No filling defects to suggest DVT on grayscale or color Doppler imaging. Doppler waveforms show normal direction of venous flow, normal respiratory plasticity and response to augmentation. Limited views of the contralateral common femoral vein are unremarkable. OTHER None. Limitations: none IMPRESSION: Negative. Electronically Signed   By: TVirgina NorfolkM.D.   On: 08/19/2020 19:56   DG Chest Portable 1 View  Result Date: 08/19/2020 CLINICAL DATA:  Cough and fever COVID EXAM: PORTABLE CHEST 1 VIEW COMPARISON:  07/13/2018 FINDINGS: Mild cardiomegaly with central congestion. Possible vague peripheral and basilar opacities. No pleural effusion or pneumothorax. IMPRESSION: Cardiomegaly with mild central congestion. Possible vague peripheral and basilar opacities/small foci of pneumonia. Electronically Signed   By: KDonavan FoilM.D.   On: 08/19/2020 15:42     Assessment and Plan:   Chest pain Syndrome: Covid 19 related vs pericarditis - hs troponin 41 --> 40 --> 26 - EKG with ST changes felt to be related to LVH - chest pain sounds atypical - occurred when lying flat, not exertional, and relieved with  her inhaler, in the setting of COVID - heparin drip started prior to arrival - will await echocardiogram - she dose have risk factors for ACS  including morbid obesity and HTN - CRP elevated - presently CP resolved; we talked about one of three options; if echo consistent with pericarditis, would favor empiric treatment adjusted to her CKD; if not and sx are still gone would do no further testing; if equivocal could consider CMR (worried that her inability to breath hold would not give diagnostic results) - discussed with primary MD; will stop heparin and start lovenox [place]  Hypertension - maintained on amlodipine, losartan, nifedipine, and labetalol at home - pressures have been elevated - may consider adding hydralazine in the setting of CKD  History of peripartum cardiomyopathy - EF 45-50% 2019, improved on echo 2020 - denies shortness of breath, orthopnea, and lower extremity swelling prior to illness - BNP 285.5 - Echo pending   CKD at least stage III - sCr 2.05 - appears near her baseline         :[id number]    HEAR Score (for undifferentiated chest pain):  HEAR Score: 4       For questions or updates, please contact [place] Please consult [email or web address] for contact info under    Signed, Ledora Bottcher, [place]  08/20/2020 12:27 PM

## 2020-08-20 NOTE — ED Notes (Signed)
Report given to [facility]. 

## 2020-08-20 NOTE — ED Notes (Signed)
Attempted to call report to floor  Nurse was in a covid room and she is to call back

## 2020-08-20 NOTE — Progress Notes (Signed)
  Echocardiogram 2D Echocardiogram has been performed.  Geoffery Lyons [place] 08/20/2020, 2:18 PM

## 2020-08-20 NOTE — Progress Notes (Signed)
ANTICOAGULATION CONSULT NOTE - Follow Up Consult  Pharmacy Consult for heparin Indication: CP and r/o VTE  Labs: Recent Labs    08/19/20 1726 08/19/20 1818 08/19/20 1932 08/19/20 2158 08/20/20 0215  HGB 12.9  --   --   --   --   HCT 41.1  --   --   --   --   PLT 173  --   --   --   --   APTT  --  31  --   --   --   HEPARINUNFRC  --   --   --   --  0.79*  CREATININE 2.05*  --   --   --   --   TROPONINIHS 41*  --  40* 26*  --     Assessment: 44yo female supratherapeutic on heparin with initial dosing for CP; no gtt issues or signs of bleeding per RN.  Goal of Therapy:  Heparin level 0.3-0.7 units/ml   Plan:  Will decrease heparin gtt by 2 units/kgABW/hr to 1000 units/hr and check level in 6 hours.    Wynona Neat, PharmD, BCPS  08/20/2020,5:46 AM

## 2020-08-20 NOTE — Progress Notes (Signed)
Paged [facility] admits at 0415 and received call back at 0418.

## 2020-08-20 NOTE — ED Notes (Signed)
Pt transferred to [place] via [facility] 

## 2020-08-21 DIAGNOSIS — R778 Other specified abnormalities of plasma proteins: Secondary | ICD-10-CM

## 2020-08-21 LAB — URINALYSIS, ROUTINE W REFLEX MICROSCOPIC
Bilirubin Urine: NEGATIVE
Glucose, UA: NEGATIVE mg/dL
Ketones, ur: NEGATIVE mg/dL
Leukocytes,Ua: NEGATIVE
Nitrite: NEGATIVE
Protein, ur: >=300 mg/dL — AB
RBC / HPF: >50 RBC/hpf — ABNORMAL HIGH (ref 0–5)
Specific Gravity, Urine: 1.027 (ref 1.005–1.030)
WBC, UA: >50 WBC/hpf — ABNORMAL HIGH (ref 0–5)
pH: 5 (ref 5.0–8.0)

## 2020-08-21 LAB — CBC WITH DIFFERENTIAL/PLATELET
Abs Immature Granulocytes: 0.01 10*3/uL (ref 0.00–0.07)
Basophils Absolute: 0 10*3/uL (ref 0.0–0.1)
Basophils Relative: 0 %
Eosinophils Absolute: 0 10*3/uL (ref 0.0–0.5)
Eosinophils Relative: 0 %
HCT: 39.4 % (ref 36.0–46.0)
Hemoglobin: 11.8 g/dL — ABNORMAL LOW (ref 12.0–15.0)
Immature Granulocytes: 0 %
Lymphocytes Relative: 38 %
Lymphs Abs: 1.4 10*3/uL (ref 0.7–4.0)
MCH: 26.9 pg (ref 26.0–34.0)
MCHC: 29.9 g/dL — ABNORMAL LOW (ref 30.0–36.0)
MCV: 89.7 fL (ref 80.0–100.0)
Monocytes Absolute: 0.3 10*3/uL (ref 0.1–1.0)
Monocytes Relative: 7 %
Neutro Abs: 2 10*3/uL (ref 1.7–7.7)
Neutrophils Relative %: 55 %
Platelets: 154 10*3/uL (ref 150–400)
RBC: 4.39 MIL/uL (ref 3.87–5.11)
RDW: 14.4 % (ref 11.5–15.5)
WBC: 3.6 10*3/uL — ABNORMAL LOW (ref 4.0–10.5)
nRBC: 0 % (ref 0.0–0.2)

## 2020-08-21 LAB — COMPREHENSIVE METABOLIC PANEL
ALT: 22 U/L (ref 0–44)
AST: 30 U/L (ref 15–41)
Albumin: 2.8 g/dL — ABNORMAL LOW (ref 3.5–5.0)
Alkaline Phosphatase: 51 U/L (ref 38–126)
Anion gap: 10 (ref 5–15)
BUN: 29 mg/dL — ABNORMAL HIGH (ref 6–20)
CO2: 20 mmol/L — ABNORMAL LOW (ref 22–32)
Calcium: 8 mg/dL — ABNORMAL LOW (ref 8.9–10.3)
Chloride: 110 mmol/L (ref 98–111)
Creatinine, Ser: 2.53 mg/dL — ABNORMAL HIGH (ref 0.44–1.00)
GFR, Estimated: 23 mL/min — ABNORMAL LOW (ref >60)
Glucose, Bld: 104 mg/dL — ABNORMAL HIGH (ref 70–99)
Potassium: 4 mmol/L (ref 3.5–5.1)
Sodium: 140 mmol/L (ref 135–145)
Total Bilirubin: 0.1 mg/dL — ABNORMAL LOW (ref 0.3–1.2)
Total Protein: 6.2 g/dL — ABNORMAL LOW (ref 6.5–8.1)

## 2020-08-21 LAB — CREATININE, URINE, RANDOM: Creatinine, Urine: 167.61 mg/dL

## 2020-08-21 LAB — SODIUM, URINE, RANDOM: Sodium, Ur: 27 mmol/L

## 2020-08-21 LAB — D-DIMER, QUANTITATIVE: D-Dimer, Quant: 0.79 ug/mL-FEU — ABNORMAL HIGH (ref 0.00–0.50)

## 2020-08-21 LAB — OSMOLALITY: Osmolality: 309 mOsm/kg — ABNORMAL HIGH (ref 275–295)

## 2020-08-21 LAB — C-REACTIVE PROTEIN: CRP: 1.2 mg/dL — ABNORMAL HIGH (ref <1.0)

## 2020-08-21 LAB — MAGNESIUM: Magnesium: 2 mg/dL (ref 1.7–2.4)

## 2020-08-21 LAB — OSMOLALITY, URINE: Osmolality, Ur: 428 mOsm/kg (ref 300–900)

## 2020-08-21 LAB — BRAIN NATRIURETIC PEPTIDE: B Natriuretic Peptide: 64.9 pg/mL (ref 0.0–100.0)

## 2020-08-21 MED ORDER — HYDRALAZINE HCL 50 MG PO TABS
100.0000 mg | ORAL_TABLET | Freq: Three times a day (TID) | ORAL | Status: DC
  Filled 2020-08-21: qty 2

## 2020-08-21 MED ORDER — SODIUM CHLORIDE 0.9 % IV SOLN
100.0000 mg | Freq: Every day | INTRAVENOUS | Status: DC
  Administered 2020-08-22: 100 mg via INTRAVENOUS
  Filled 2020-08-21: qty 20

## 2020-08-21 MED ORDER — LACTATED RINGERS IV SOLN: INTRAVENOUS | Status: AC

## 2020-08-21 MED ORDER — HYDRALAZINE HCL 25 MG PO TABS
25.0000 mg | ORAL_TABLET | Freq: Three times a day (TID) | ORAL | Status: DC
  Filled 2020-08-21: qty 1

## 2020-08-21 MED ORDER — SODIUM CHLORIDE 0.9 % IV SOLN
200.0000 mg | Freq: Once | INTRAVENOUS | Status: AC
  Administered 2020-08-21: 200 mg via INTRAVENOUS
  Filled 2020-08-21: qty 40

## 2020-08-21 NOTE — Plan of Care (Signed)

## 2020-08-21 NOTE — Progress Notes (Signed)
PROGRESS NOTE                                                                                                                                                                                                             Patient Demographics:    Dawn Thomas, is a 45 y.o. 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  He was diagnosed with mild COVID-19 pneumonia and admitted to the hospital.   Subjective:    [name] today has, No headache, No chest pain, No abdominal pain - No Nausea, No new weakness tingling or numbness, no SOB.   Assessment  & Plan :     1. Acute Covid 19 Viral Pneumonitis  - she is unvaccinated and seems to have incurred mild parenchymal lung injury so far, will place on 3-day course of remdesivir and monitor closely, if any worse steroids will be added.  Encouraged the patient to sit up in chair in the daytime use I-S and flutter valve for pulmonary toiletry and then prone in bed when at night.  Will advance activity and titrate down oxygen as possible.     SpO2: 93 % O2 Flow Rate (L/min): 2 L/min FiO2 (%): 100 %  Recent Labs  Lab 08/19/20 1726 08/19/20 1818 08/20/20 0913 08/20/20 1150 08/21/20 0214 08/21/20 0917  WBC 4.6  --  4.5  --  3.6*  --   HGB 12.9  --  11.9*  --  11.8*  --   HCT 41.1  --  39.6  --  39.4  --   PLT 173  --  165  --  154  --   CRP  --   --  1.6*  --  1.2*  --   BNP 41.1  --   --   --   --  64.9  DDIMER  --  0.80* 0.76*   --  0.79*  --  PROCALCITON  --   --   --  0.16  --   --   AST  --   --  33  --  30  --   ALT  --   --  22  --  22  --   ALKPHOS  --   --  49  --  51  --   BILITOT  --   --  0.6  --  0.1*  --   ALBUMIN  --   --  3.0*  --  2.8*  --     2. Cough related chest pain with nonspecific non-ACS pattern troponin rise.  EKG nonacute, echo stable, reviewed by cardiology, outpatient follow-up.  No further work-up.   3.  AKI on CKD 3B.  Baseline creatinine around 2.  Hydrate and monitor hold ARB.  4.  Essential hypertension.  Is on nifedipine along with ARB, ARB held replaced with hydralazine.  Monitor.  5.  Hypothyroidism.  On Synthroid.  6.  History of peripartum cardiomyopathy in the past.  Currently stable.       Condition - Extremely Guarded  Family Communication  : Patient to update family herself  Code Status : Full  Consults  : Cards  Procedures  :   TTE - 1. Left ventricular ejection fraction, by estimation, is 50 to 55%. The left ventricle has low normal function. The left ventricle has no regional wall motion abnormalities. There is mild concentric left ventricular hypertrophy. Left ventricular diastolic parameters are indeterminate.  2. Right ventricular systolic function is normal. The right ventricular size is normal.  3. The mitral valve is normal in structure. Trivial mitral valve regurgitation.  4. The aortic valve is grossly normal. There is mild calcification of the aortic valve. Aortic valve regurgitation is moderate. Mild aortic valve sclerosis is present, with no evidence of aortic valve stenosis.  5. The inferior vena cava is normal in size with greater than 50% respiratory variability, suggesting right atrial pressure of 3 mmHg. Comparison(s): Changes from prior study are noted. EF improved since prior.  CT - 1. Examination is limited by respiratory motion artifact and patient body habitus. Given this limitation, there is no evidence for pulmonary embolus. 2. Ground-glass  airspace opacities are noted throughout both lung fields, concerning for an atypical infectious process such as viral pneumonia. 3. Trace left-sided pleural effusion. 4. Cardiomegaly with coronary artery disease. Aortic Atherosclerosis.  Leg [place] - no DVT    PUD Prophylaxis : PPI  Disposition Plan  :    Status is: Inpatient  Remains inpatient appropriate because:IV treatments appropriate due to intensity of illness or inability to take PO   Dispo: The patient is from: Home              Anticipated d/c is to: Home              Anticipated d/c date is: 3 days              Patient currently is not medically stable to d/c.   DVT Prophylaxis  :  Lovenox    Lab Results  Component Value Date   PLT 154 08/21/2020    Diet :  Diet Order            Diet Heart Room service appropriate? Yes; Fluid consistency: Thin  Diet effective now                  Inpatient Medications  Scheduled Meds:  vitamin  C  500 mg Oral Daily   doxylamine (Sleep)  25 mg Oral QHS   enoxaparin (LOVENOX) injection  60 mg Subcutaneous Q24H   hydrALAZINE  25 mg Oral Q8H   levothyroxine  75 mcg Oral Daily   NIFEdipine  60 mg Oral BID   pantoprazole  40 mg Oral Daily   sertraline  50 mg Oral Daily   zinc sulfate  220 mg Oral Daily   Continuous Infusions:  lactated ringers     PRN Meds:.acetaminophen, albuterol, guaiFENesin-dextromethorphan, hydrALAZINE, [DISCONTINUED] ondansetron **OR** ondansetron (ZOFRAN) IV, polyethylene glycol  Antibiotics  :    Anti-infectives (From admission, onward)   Start     Dose/Rate Route Frequency Ordered Stop   08/21/20 1000  remdesivir 100 mg in sodium chloride 0.9 % 100 mL IVPB  Status:  Discontinued       "Followed by" Linked Group Details   100 mg 200 mL/hr over 30 Minutes Intravenous Daily 08/20/20 0836 08/20/20 1151   08/20/20 1100  remdesivir 200 mg in sodium chloride 0.9% 250 mL IVPB  Status:  Discontinued       "Followed by" Linked Group Details    200 mg 580 mL/hr over 30 Minutes Intravenous Once 08/20/20 [id number] 08/20/20 1151       Time Spent in minutes  30   Lala Lund M.D on 08/21/2020 at 11:20 AM  To page go to [email or web address]   [facility] -  Office  [phone]  See all Orders from today for further details    Objective:   Vitals:   08/20/20 2043 08/20/20 2223 08/21/20 0421 08/21/20 0928  BP: (!) 153/81 (!) 155/83 (!) 152/65 127/65  Pulse: 66  68 78  Resp: 11  (!) 21 20  Temp: 98.3 F (36.8 C)  99 F (37.2 C) 99.2 F (37.3 C)  TempSrc: Oral  Oral Oral  SpO2: 97%  97% 93%  Weight:   (!) 145.4 kg   Height:        Wt Readings from Last 3 Encounters:  08/21/20 (!) 145.4 kg  01/21/19 (!) 139.3 kg  07/13/18 130.9 kg     Intake/Output Summary (Last 24 hours) at 08/21/2020 1120 Last data filed at 08/21/2020 0419 Gross per 24 hour  Intake 1999.69 ml  Output 800 ml  Net 1199.69 ml     Physical Exam  Awake Alert, No new F.N deficits, Normal affect [place].AT,PERRAL Supple Neck,No JVD, No cervical lymphadenopathy appriciated.  Symmetrical Chest wall movement, Good air movement bilaterally, CTAB RRR,No Gallops,Rubs or new Murmurs, No Parasternal Heave +ve B.Sounds, Abd Soft, No tenderness, No organomegaly appriciated, No rebound - guarding or rigidity. No Cyanosis, Clubbing or edema, No new Rash or bruise      Data Review:    CBC Recent Labs  Lab 08/19/20 1726 08/20/20 0913 08/21/20 0214  WBC 4.6 4.5 3.6*  HGB 12.9 11.9* 11.8*  HCT 41.1 39.6 39.4  PLT 173 165 154  MCV 89.7 91.5 89.7  MCH 28.2 27.5 26.9  MCHC 31.4 30.1 29.9*  RDW 14.6 14.6 14.4  LYMPHSABS 1.3 1.9 1.4  MONOABS 0.3 0.2 0.3  EOSABS 0.0 0.0 0.0  BASOSABS 0.0 0.0 0.0    Recent Labs  Lab 08/19/20 1726 08/19/20 1818 08/20/20 0913 08/20/20 1150 08/21/20 0214 08/21/20 0917  NA 138  --  139  --  140  --   K 4.1  --  4.0  --  4.0  --   CL 104  --  107  --  110  --   CO2 23  --  19*  --  20*  --   GLUCOSE 106*  --  84   --  104*  --   BUN 23*  --  23*  --  29*  --   CREATININE 2.05*  --  2.27*  --  2.53*  --   CALCIUM 8.3*  --  8.0*  --  8.0*  --   AST  --   --  33  --  30  --   ALT  --   --  22  --  22  --   ALKPHOS  --   --  49  --  51  --   BILITOT  --   --  0.6  --  0.1*  --   ALBUMIN  --   --  3.0*  --  2.8*  --   MG  --   --  1.9  --  2.0  --   CRP  --   --  1.6*  --  1.2*  --   DDIMER  --  0.80* 0.76*  --  0.79*  --   PROCALCITON  --   --   --  0.16  --   --   BNP 41.1  --   --   --   --  64.9    ------------------------------------------------------------------------------------------------------------------ No results for input(s): CHOL, HDL, LDLCALC, TRIG, CHOLHDL, LDLDIRECT in the last 72 hours.  No results found for: HGBA1C ------------------------------------------------------------------------------------------------------------------ No results for input(s): TSH, T4TOTAL, T3FREE, THYROIDAB in the last 72 hours.  Invalid input(s): FREET3  Cardiac Enzymes No results for input(s): CKMB, TROPONINI, MYOGLOBIN in the last 168 hours.  Invalid input(s): CK ------------------------------------------------------------------------------------------------------------------    Component Value Date/Time   BNP 64.9 08/21/2020 0917    Micro Results No results found for this or any previous visit (from the past 240 hour(s)).  Radiology Reports CT Angio Chest PE W and/or Wo Contrast  Result Date: 08/20/2020 CLINICAL DATA:  Productive cough and fever. Positive D-dimer. History of sleep apnea. EXAM: CT ANGIOGRAPHY CHEST WITH CONTRAST TECHNIQUE: Multidetector CT imaging of the chest was performed using the standard protocol during bolus administration of intravenous contrast. Multiplanar CT image reconstructions and MIPs were obtained to evaluate the vascular anatomy. CONTRAST:  68m OMNIPAQUE IOHEXOL 350 MG/ML SOLN COMPARISON:  CT dated July 11, 2018. FINDINGS: Cardiovascular: Examination is  limited by respiratory motion artifact and patient body habitus. There is no pulmonary embolus or evidence of right heart strain. The size of the main pulmonary artery is normal. Cardiomegaly with coronary artery calcification. The course and caliber of the aorta are normal. There is mild atherosclerotic calcification. Opacification decreased due to pulmonary arterial phase contrast bolus timing. Mediastinum/Nodes: -- No mediastinal lymphadenopathy. -- No hilar lymphadenopathy. -- No axillary lymphadenopathy. -- No supraclavicular lymphadenopathy. -- Normal thyroid gland where visualized. -  Unremarkable esophagus. Lungs/Pleura: Ground-glass airspace opacities are noted throughout both lung fields. There is no pneumothorax. The trachea is unremarkable. There is a trace left-sided pleural effusion. Upper Abdomen: Contrast bolus timing is not optimized for evaluation of the abdominal organs. The visualized portions of the organs of the upper abdomen are normal. Musculoskeletal: No chest wall abnormality. No bony spinal canal stenosis. Review of the MIP images confirms the above findings. IMPRESSION: 1. Examination is limited by respiratory motion artifact and patient body habitus. Given this limitation, there is no evidence for pulmonary embolus. 2. Ground-glass airspace opacities are noted throughout  both lung fields, concerning for an atypical infectious process such as viral pneumonia. 3. Trace left-sided pleural effusion. 4. Cardiomegaly with coronary artery disease. Aortic Atherosclerosis (ICD10-I70.0). Electronically Signed   By: Constance Holster M.D.   On: 08/20/2020 06:52   US Venous Img Lower Bilateral  Result Date: 08/19/2020 CLINICAL DATA:  COVID positive with shortness of breath. EXAM: BILATERAL LOWER EXTREMITY VENOUS DOPPLER ULTRASOUND TECHNIQUE: Gray-scale sonography with compression, as well as color and duplex ultrasound, were performed to evaluate the deep venous system(s) from the level of the  common femoral vein through the popliteal and proximal calf veins. COMPARISON:  None. FINDINGS: VENOUS Normal compressibility of the common femoral, superficial femoral, and popliteal veins, as well as the visualized calf veins. Visualized portions of profunda femoral vein and great saphenous vein unremarkable. No filling defects to suggest DVT on grayscale or color Doppler imaging. Doppler waveforms show normal direction of venous flow, normal respiratory plasticity and response to augmentation. Limited views of the contralateral common femoral vein are unremarkable. OTHER None. Limitations: none IMPRESSION: Negative. Electronically Signed   By: Virgina Norfolk M.D.   On: 08/19/2020 19:56   DG Chest Portable 1 View  Result Date: 08/19/2020 CLINICAL DATA:  Cough and fever COVID EXAM: PORTABLE CHEST 1 VIEW COMPARISON:  07/13/2018 FINDINGS: Mild cardiomegaly with central congestion. Possible vague peripheral and basilar opacities. No pleural effusion or pneumothorax. IMPRESSION: Cardiomegaly with mild central congestion. Possible vague peripheral and basilar opacities/small foci of pneumonia. Electronically Signed   By: Donavan Foil M.D.   On: 08/19/2020 15:42   ECHOCARDIOGRAM LIMITED  Result Date: 08/20/2020    ECHOCARDIOGRAM LIMITED REPORT   Patient Name:   TAIGEN BRANYAN Date of Exam: 08/20/2020 Medical Rec #:  [id number]     Height:       66.0 in Accession #:    [id number]    Weight:       309.1 lb Date of Birth:  Dec 22, 1975     BSA:          2.406 m Patient Age:    70 years      BP:           148/84 mmHg Patient Gender: F             HR:           80 bpm. Exam Location:  Inpatient Procedure: Limited Echo, Cardiac Doppler and Color Doppler Indications:    Elevated Troponin  History:        Patient has prior history of Echocardiogram examinations, most                 recent 07/12/2018. Risk Factors:Hypertension, Sleep Apnea and                 Non-Smoker.  Sonographer:    Vickie Epley RDCS Referring Phys:  [id number] [facility]ryll [facility]  Sonographer Comments: Covid positive. IMPRESSIONS  1. Left ventricular ejection fraction, by estimation, is 50 to 55%. The left ventricle has low normal function. The left ventricle has no regional wall motion abnormalities. There is mild concentric left ventricular hypertrophy. Left ventricular diastolic parameters are indeterminate.  2. Right ventricular systolic function is normal. The right ventricular size is normal.  3. The mitral valve is normal in structure. Trivial mitral valve regurgitation.  4. The aortic valve is grossly normal. There is mild calcification of the aortic valve. Aortic valve regurgitation is moderate. Mild aortic valve sclerosis is present, with no evidence of aortic valve  stenosis.  5. The inferior vena cava is normal in size with greater than 50% respiratory variability, suggesting right atrial pressure of 3 mmHg. Comparison(s): Changes from prior study are noted. EF improved since prior. FINDINGS  Left Ventricle: Left ventricular ejection fraction, by estimation, is 50 to 55%. The left ventricle has low normal function. The left ventricle has no regional wall motion abnormalities. The left ventricular internal cavity size was normal in size. There is mild concentric left ventricular hypertrophy. Left ventricular diastolic parameters are indeterminate. Right Ventricle: The right ventricular size is normal. No increase in right ventricular wall thickness. Right ventricular systolic function is normal. Left Atrium: Left atrial size was not well visualized. Right Atrium: Right atrial size was not well visualized. Pericardium: There is no evidence of pericardial effusion. Mitral Valve: The mitral valve is normal in structure. There is mild thickening of the mitral valve leaflet(s). Trivial mitral valve regurgitation. Tricuspid Valve: The tricuspid valve is normal in structure. Tricuspid valve regurgitation is trivial. No evidence of tricuspid stenosis. Aortic  Valve: The aortic valve is grossly normal. There is mild calcification of the aortic valve. Aortic valve regurgitation is moderate. Mild aortic valve sclerosis is present, with no evidence of aortic valve stenosis. Pulmonic Valve: The pulmonic valve was grossly normal. Pulmonic valve regurgitation is mild. Aorta: The aortic root, ascending aorta, aortic arch and descending aorta are all structurally normal, with no evidence of dilitation or obstruction. Venous: The inferior vena cava is normal in size with greater than 50% respiratory variability, suggesting right atrial pressure of 3 mmHg. IAS/Shunts: The interatrial septum was not well visualized. LEFT VENTRICLE PLAX 2D LVIDd:         4.97 cm      Diastology LVIDs:         3.26 cm      LV e' medial:    4.13 cm/s LV PW:         1.23 cm      LV E/e' medial:  15.4 LV IVS:        1.49 cm      LV e' lateral:   6.30 cm/s LVOT diam:     2.60 cm      LV E/e' lateral: 10.1 LV SV:         122 LV SV Index:   51 LVOT Area:     5.31 cm  LV Volumes (MOD) LV vol d, MOD A2C: 253.0 ml LV vol d, MOD A4C: 251.0 ml LV vol s, MOD A2C: 115.0 ml LV vol s, MOD A4C: 101.0 ml LV SV MOD A2C:     138.0 ml LV SV MOD A4C:     251.0 ml LV SV MOD BP:      143.1 ml RIGHT VENTRICLE RV S prime:     17.30 cm/s TAPSE (M-mode): 2.2 cm LEFT ATRIUM         Index LA diam:    4.30 cm 1.79 cm/m  AORTIC VALVE LVOT Vmax:   126.00 cm/s LVOT Vmean:  94.800 cm/s LVOT VTI:    0.229 m  AORTA Ao Root diam: 3.80 cm Ao Asc diam:  3.40 cm MITRAL VALVE MV Area (PHT): 3.12 cm    SHUNTS MV Decel Time: 243 msec    Systemic VTI:  0.23 m MV E velocity: 63.40 cm/s  Systemic Diam: 2.60 cm MV A velocity: 63.40 cm/s MV E/A ratio:  1.00 Buford Dresser MD Electronically signed by Buford Dresser MD Signature Date/Time: 08/20/2020/6:08:31 PM  Final

## 2020-08-21 NOTE — Evaluation (Signed)
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 Admitted for treatment of COVID Pneumonitis and assessment of chest pain  Clinical Impression  PTA pt living with 45 yo and 69 month old daughters in single story home with 1 step to enter. Pt completely independent caring for daughters and working. Pt is currently limited in safe mobility by SoB with ambulation and generalized fatigue. Pt is mod I for bed mobility and transfers and supervision for ambulation in room. Pt with slight dizziness with initial standing. Pt will not need any further PT services at discharge, however PT will continue to follow acutely to improve endurance.     Follow Up Recommendations No PT follow up;Supervision - Intermittent    Equipment Recommendations  None recommended by PT       Precautions / Restrictions Precautions Precautions: None Restrictions Weight Bearing Restrictions: No      Mobility  Bed Mobility Overal bed mobility: Modified Independent             General bed mobility comments: increased time and effort to come to EoB    Transfers Overall transfer level: Modified independent Equipment used: None             General transfer comment: increased effort to come to standing  Ambulation/Gait Ambulation/Gait assistance: Supervision Gait Distance (Feet): 30 Feet Assistive device: IV Pole Gait Pattern/deviations: Step-through pattern;Decreased step length - right;Decreased step length - left;Shuffle Gait velocity: slowed Gait velocity interpretation: 1.31 - 2.62 ft/sec, indicative of  limited community ambulator General Gait Details: supervision for slowed, shuffling gait, pt utilized sink for stabilization and then requested to push IV pole to steady herself, Pt reports slight dizziness and fatigue with walking, however reports she knows she needs to keep walking to get out of the hospital.         Balance Overall balance assessment: Mild deficits observed, not formally tested                                           Pertinent Vitals/Pain Pain Assessment: No/denies pain    Home Living Family/patient expects to be discharged to:: Private residence Living Arrangements: Children Available Help at Discharge: Family;Available PRN/intermittently Type of Home: House Home Access: Stairs to enter   [employer] of Steps: 1 Home Layout: One level Home Equipment: None      Prior Function Level of Independence: Independent                  Extremity/Trunk Assessment   Upper Extremity Assessment Upper Extremity Assessment: Overall WFL for tasks assessed    Lower Extremity Assessment Lower Extremity Assessment: Overall WFL for tasks assessed       Communication   Communication: No difficulties  Cognition Arousal/Alertness: Awake/alert Behavior During Therapy: WFL for tasks assessed/performed Overall Cognitive Status: Within Functional Limits for tasks assessed                                        General  Comments General comments (skin integrity, edema, etc.): VSS on RA, pt with slight dizziness with initial standing which dissipated, and reports increased fatigue with walking        Assessment/Plan    PT Assessment Patient needs continued PT services  PT Problem List Cardiopulmonary status limiting activity;Decreased activity tolerance       PT Treatment Interventions DME instruction;Gait training;Stair training;Functional mobility training;Therapeutic activities;Therapeutic exercise;Balance  training;Cognitive remediation;Patient/family education    PT Goals (Current goals can be found in the Care Plan section)  Acute Rehab PT Goals Patient Stated Goal: get home to her daughters PT Goal Formulation: With patient Time For Goal Achievement: 09/04/20 Potential to Achieve Goals: Good    Frequency Min 3X/week    AM-PAC PT "6 Clicks" Mobility  Outcome Measure Help needed turning from your back to your side while in a flat bed without using bedrails?: None Help needed moving from lying on your back to sitting on the side of a flat bed without using bedrails?: None Help needed moving to and from a bed to a chair (including a wheelchair)?: None Help needed standing up from a chair using your arms (e.g., wheelchair or bedside chair)?: None Help needed to walk in hospital room?: None Help needed climbing 3-5 steps with a railing? : A Little 6 Click Score: 23    End of Session   Activity Tolerance: Patient limited by fatigue Patient left: in chair;with call bell/phone within reach Nurse Communication: Mobility status PT Visit Diagnosis: Unsteadiness on feet (R26.81);Muscle weakness (generalized) (M62.81)    Time: [id number] PT Time Calculation (min) (ACUTE ONLY): 22 min   Charges:   PT Evaluation $PT Eval Moderate Complexity: 1 Mod          Jovian Lembcke B. Migdalia Dk PT, DPT Acute Rehabilitation Services Pager [phone] Office [phone]   [place] 08/21/2020, 5:11 PM

## 2020-08-21 NOTE — Progress Notes (Signed)
Progress Note  Patient Name: Dawn Thomas Date of Encounter: 08/21/2020  Primary Cardiologist: Werner Lean, MD   Subjective   No return of chest pain overnight.  Notes cough but no otther issues.  Denies SOB presently.  Inpatient Medications    Scheduled Meds:  vitamin C  500 mg Oral Daily   doxylamine (Sleep)  25 mg Oral QHS   enoxaparin (LOVENOX) injection  60 mg Subcutaneous Q24H   levothyroxine  75 mcg Oral Daily   losartan  100 mg Oral Daily   NIFEdipine  60 mg Oral BID   pantoprazole  40 mg Oral Daily   sertraline  50 mg Oral Daily   zinc sulfate  220 mg Oral Daily   Continuous Infusions:  PRN Meds: acetaminophen, albuterol, guaiFENesin-dextromethorphan, hydrALAZINE, ondansetron **OR** ondansetron (ZOFRAN) IV, polyethylene glycol   Vital Signs    Vitals:   08/20/20 1712 08/20/20 2043 08/20/20 2223 08/21/20 0421  BP: (!) 144/77 (!) 153/81 (!) 155/83 (!) 152/65  Pulse: 81 66  68  Resp: 18 11  (!) 21  Temp: 98.3 F (36.8 C) 98.3 F (36.8 C)  99 F (37.2 C)  TempSrc: Oral Oral  Oral  SpO2: 96% 97%  97%  Weight:    (!) 145.4 kg  Height:        Intake/Output Summary (Last 24 hours) at 08/21/2020 0852 Last data filed at 08/21/2020 0419 Gross per 24 hour  Intake 1999.69 ml  Output 800 ml  Net 1199.69 ml   Filed Weights   08/19/20 1518 08/20/20 0415 08/21/20 0421  Weight: (!) 141.5 kg (!) 140.2 kg (!) 145.4 kg    Telemetry    Currently off, last at SR - Personally Reviewed  ECG    No new - Personally Reviewed  Physical Exam   GEN: No acute distress.  Obese female Neck: No JVD Cardiac: RRR, no murmurs, rubs, or gallops.  Respiratory: Coarse breath sounds that improve with cough GI: Soft, nontender, non-distended  MS: No edema; No deformity. Neuro:  Nonfocal  Psych: Normal affect   Labs    Chemistry Recent Labs  Lab 08/19/20 1726 08/20/20 0913 08/21/20 0214  NA 138 139 140  K 4.1 4.0 4.0  CL 104 107 110  CO2 23  19* 20*  GLUCOSE 106* 84 104*  BUN 23* 23* 29*  CREATININE 2.05* 2.27* 2.53*  CALCIUM 8.3* 8.0* 8.0*  PROT  --  6.4* 6.2*  ALBUMIN  --  3.0* 2.8*  AST  --  33 30  ALT  --  22 22  ALKPHOS  --  49 51  BILITOT  --  0.6 0.1*  GFRNONAA 30* 27* 23*  ANIONGAP '11 13 10     '$ Hematology Recent Labs  Lab 08/19/20 1726 08/20/20 0913 08/21/20 0214  WBC 4.6 4.5 3.6*  RBC 4.58 4.33 4.39  HGB 12.9 11.9* 11.8*  HCT 41.1 39.6 39.4  MCV 89.7 91.5 89.7  MCH 28.2 27.5 26.9  MCHC 31.4 30.1 29.9*  RDW 14.6 14.6 14.4  PLT 173 165 154    Cardiac EnzymesNo results for input(s): TROPONINI in the last 168 hours. No results for input(s): TROPIPOC in the last 168 hours.   BNP Recent Labs  Lab 08/19/20 1726  BNP 41.1     DDimer  Recent Labs  Lab 08/19/20 1818 08/20/20 0913 08/21/20 0214  DDIMER 0.80* 0.76* 0.79*     Radiology    CT Angio Chest PE W and/or Wo Contrast  Result Date:  08/20/2020 CLINICAL DATA:  Productive cough and fever. Positive D-dimer. History of sleep apnea. EXAM: CT ANGIOGRAPHY CHEST WITH CONTRAST TECHNIQUE: Multidetector CT imaging of the chest was performed using the standard protocol during bolus administration of intravenous contrast. Multiplanar CT image reconstructions and MIPs were obtained to evaluate the vascular anatomy. CONTRAST:  54m OMNIPAQUE IOHEXOL 350 MG/ML SOLN COMPARISON:  CT dated July 11, 2018. FINDINGS: Cardiovascular: Examination is limited by respiratory motion artifact and patient body habitus. There is no pulmonary embolus or evidence of right heart strain. The size of the main pulmonary artery is normal. Cardiomegaly with coronary artery calcification. The course and caliber of the aorta are normal. There is mild atherosclerotic calcification. Opacification decreased due to pulmonary arterial phase contrast bolus timing. Mediastinum/Nodes: -- No mediastinal lymphadenopathy. -- No hilar lymphadenopathy. -- No axillary lymphadenopathy. -- No  supraclavicular lymphadenopathy. -- Normal thyroid gland where visualized. -  Unremarkable esophagus. Lungs/Pleura: Ground-glass airspace opacities are noted throughout both lung fields. There is no pneumothorax. The trachea is unremarkable. There is a trace left-sided pleural effusion. Upper Abdomen: Contrast bolus timing is not optimized for evaluation of the abdominal organs. The visualized portions of the organs of the upper abdomen are normal. Musculoskeletal: No chest wall abnormality. No bony spinal canal stenosis. Review of the MIP images confirms the above findings. IMPRESSION: 1. Examination is limited by respiratory motion artifact and patient body habitus. Given this limitation, there is no evidence for pulmonary embolus. 2. Ground-glass airspace opacities are noted throughout both lung fields, concerning for an atypical infectious process such as viral pneumonia. 3. Trace left-sided pleural effusion. 4. Cardiomegaly with coronary artery disease. Aortic Atherosclerosis (ICD10-I70.0). Electronically Signed   By: CConstance HolsterM.D.   On: 08/20/2020 06:52   UKoreaVenous Img Lower Bilateral  Result Date: 08/19/2020 CLINICAL DATA:  COVID positive with shortness of breath. EXAM: BILATERAL LOWER EXTREMITY VENOUS DOPPLER ULTRASOUND TECHNIQUE: Gray-scale sonography with compression, as well as color and duplex ultrasound, were performed to evaluate the deep venous system(s) from the level of the common femoral vein through the popliteal and proximal calf veins. COMPARISON:  None. FINDINGS: VENOUS Normal compressibility of the common femoral, superficial femoral, and popliteal veins, as well as the visualized calf veins. Visualized portions of profunda femoral vein and great saphenous vein unremarkable. No filling defects to suggest DVT on grayscale or color Doppler imaging. Doppler waveforms show normal direction of venous flow, normal respiratory plasticity and response to augmentation. Limited views of  the contralateral common femoral vein are unremarkable. OTHER None. Limitations: none IMPRESSION: Negative. Electronically Signed   By: TVirgina NorfolkM.D.   On: 08/19/2020 19:56   DG Chest Portable 1 View  Result Date: 08/19/2020 CLINICAL DATA:  Cough and fever COVID EXAM: PORTABLE CHEST 1 VIEW COMPARISON:  07/13/2018 FINDINGS: Mild cardiomegaly with central congestion. Possible vague peripheral and basilar opacities. No pleural effusion or pneumothorax. IMPRESSION: Cardiomegaly with mild central congestion. Possible vague peripheral and basilar opacities/small foci of pneumonia. Electronically Signed   By: KDonavan FoilM.D.   On: 08/19/2020 15:42   ECHOCARDIOGRAM LIMITED  Result Date: 08/20/2020    ECHOCARDIOGRAM LIMITED REPORT   Patient Name:   Dawn HALLYDate of Exam: 08/20/2020 Medical Rec #:  [id number]    Height:       66.0 in Accession #:    [id number]   Weight:       309.1 lb Date of Birth:  [birth year]    BSA:  2.406 m Patient Age:    45 years      BP:           148/84 mmHg Patient Gender: F             HR:           80 bpm. Exam Location:  Inpatient Procedure: Limited Echo, Cardiac Doppler and Color Doppler Indications:    Elevated Troponin  History:        Patient has prior history of Echocardiogram examinations, most                 recent 07/12/2018. Risk Factors:Hypertension, Sleep Apnea and                 Non-Smoker.  Sonographer:    Vickie Epley RDCS Referring Phys: [id number] Sherryll Burger [facility]  Sonographer Comments: Covid positive. IMPRESSIONS  1. Left ventricular ejection fraction, by estimation, is 50 to 55%. The left ventricle has low normal function. The left ventricle has no regional wall motion abnormalities. There is mild concentric left ventricular hypertrophy. Left ventricular diastolic parameters are indeterminate.  2. Right ventricular systolic function is normal. The right ventricular size is normal.  3. The mitral valve is normal in structure. Trivial mitral valve  regurgitation.  4. The aortic valve is grossly normal. There is mild calcification of the aortic valve. Aortic valve regurgitation is moderate. Mild aortic valve sclerosis is present, with no evidence of aortic valve stenosis.  5. The inferior vena cava is normal in size with greater than 50% respiratory variability, suggesting right atrial pressure of 3 mmHg. Comparison(s): Changes from prior study are noted. EF improved since prior. FINDINGS  Left Ventricle: Left ventricular ejection fraction, by estimation, is 50 to 55%. The left ventricle has low normal function. The left ventricle has no regional wall motion abnormalities. The left ventricular internal cavity size was normal in size. There is mild concentric left ventricular hypertrophy. Left ventricular diastolic parameters are indeterminate. Right Ventricle: The right ventricular size is normal. No increase in right ventricular wall thickness. Right ventricular systolic function is normal. Left Atrium: Left atrial size was not well visualized. Right Atrium: Right atrial size was not well visualized. Pericardium: There is no evidence of pericardial effusion. Mitral Valve: The mitral valve is normal in structure. There is mild thickening of the mitral valve leaflet(s). Trivial mitral valve regurgitation. Tricuspid Valve: The tricuspid valve is normal in structure. Tricuspid valve regurgitation is trivial. No evidence of tricuspid stenosis. Aortic Valve: The aortic valve is grossly normal. There is mild calcification of the aortic valve. Aortic valve regurgitation is moderate. Mild aortic valve sclerosis is present, with no evidence of aortic valve stenosis. Pulmonic Valve: The pulmonic valve was grossly normal. Pulmonic valve regurgitation is mild. Aorta: The aortic root, ascending aorta, aortic arch and descending aorta are all structurally normal, with no evidence of dilitation or obstruction. Venous: The inferior vena cava is normal in size with greater than  50% respiratory variability, suggesting right atrial pressure of 3 mmHg. IAS/Shunts: The interatrial septum was not well visualized. LEFT VENTRICLE PLAX 2D LVIDd:         4.97 cm      Diastology LVIDs:         3.26 cm      LV e' medial:    4.13 cm/s LV PW:         1.23 cm      LV E/e' medial:  15.4 LV IVS:  1.49 cm      LV e' lateral:   6.30 cm/s LVOT diam:     2.60 cm      LV E/e' lateral: 10.1 LV SV:         122 LV SV Index:   51 LVOT Area:     5.31 cm  LV Volumes (MOD) LV vol d, MOD A2C: 253.0 ml LV vol d, MOD A4C: 251.0 ml LV vol s, MOD A2C: 115.0 ml LV vol s, MOD A4C: 101.0 ml LV SV MOD A2C:     138.0 ml LV SV MOD A4C:     251.0 ml LV SV MOD BP:      143.1 ml RIGHT VENTRICLE RV S prime:     17.30 cm/s TAPSE (M-mode): 2.2 cm LEFT ATRIUM         Index LA diam:    4.30 cm 1.79 cm/m  AORTIC VALVE LVOT Vmax:   126.00 cm/s LVOT Vmean:  94.800 cm/s LVOT VTI:    0.229 m  AORTA Ao Root diam: 3.80 cm Ao Asc diam:  3.40 cm MITRAL VALVE MV Area (PHT): 3.12 cm    SHUNTS MV Decel Time: 243 msec    Systemic VTI:  0.23 m MV E velocity: 63.40 cm/s  Systemic Diam: 2.60 cm MV A velocity: 63.40 cm/s MV E/A ratio:  1.00 Buford Dresser MD Electronically signed by Buford Dresser MD Signature Date/Time: 08/20/2020/6:08:31 PM    Final     Cardiac Studies   Personally reviewed Echo:  No pericardial effusion, with improvement in LVEF  Patient Profile     45 y.o. female history of peripartum cardiomyopathy with atypical chest pain in the setting of COVID 19+  Assessment & Plan    CP Syndrome (resolved) COVID-19 CKD Stage IIIb - no diffuse PR depressions, no diffuse ST elevations, no regional WMAs or pericardial effusion and CP has now resolved - less likely related to COVID-pericarditis though her inflammatory markers are elevated - given CKD and that therapy may negatively affect kidney function, would not pursue Ibuprofen/colchicine therapy unless patient develops pericarditic pain  Morbid  Obesity HTN Hx of Peripartum HCM - improved from prior echo; patient does not plan future children - will see as outpatient; if persistent BP with recovery from COVID-19+ (BP increases with coughing spells) we will escalate therapy  Discussed with patient and primary MD  [facility] will sign off.   Medication Recommendations:  None Other recommendations (labs, testing, etc):  None Follow up as an outpatient:  We will arrange outpatient follow up.  For questions or updates, please contact [place] Please consult [email or web address] for contact info under Cardiology/STEMI.      Signed, Werner Lean, MD  08/21/2020, 8:52 AM

## 2020-08-22 ENCOUNTER — Inpatient Hospital Stay (HOSPITAL_COMMUNITY): Payer: Medicaid Other

## 2020-08-22 LAB — COMPREHENSIVE METABOLIC PANEL
ALT: 19 U/L (ref 0–44)
AST: 26 U/L (ref 15–41)
Albumin: 2.5 g/dL — ABNORMAL LOW (ref 3.5–5.0)
Alkaline Phosphatase: 47 U/L (ref 38–126)
Anion gap: 10 (ref 5–15)
BUN: 29 mg/dL — ABNORMAL HIGH (ref 6–20)
CO2: 18 mmol/L — ABNORMAL LOW (ref 22–32)
Calcium: 7.6 mg/dL — ABNORMAL LOW (ref 8.9–10.3)
Chloride: 111 mmol/L (ref 98–111)
Creatinine, Ser: 2.67 mg/dL — ABNORMAL HIGH (ref 0.44–1.00)
GFR, Estimated: 22 mL/min — ABNORMAL LOW (ref >60)
Glucose, Bld: 99 mg/dL (ref 70–99)
Potassium: 4.3 mmol/L (ref 3.5–5.1)
Sodium: 139 mmol/L (ref 135–145)
Total Bilirubin: 0.5 mg/dL (ref 0.3–1.2)
Total Protein: 5.7 g/dL — ABNORMAL LOW (ref 6.5–8.1)

## 2020-08-22 LAB — C-REACTIVE PROTEIN: CRP: 3.3 mg/dL — ABNORMAL HIGH (ref <1.0)

## 2020-08-22 LAB — CBC WITH DIFFERENTIAL/PLATELET
Abs Immature Granulocytes: 0.02 10*3/uL (ref 0.00–0.07)
Basophils Absolute: 0 10*3/uL (ref 0.0–0.1)
Basophils Relative: 0 %
Eosinophils Absolute: 0 10*3/uL (ref 0.0–0.5)
Eosinophils Relative: 0 %
HCT: 35.9 % — ABNORMAL LOW (ref 36.0–46.0)
Hemoglobin: 11.6 g/dL — ABNORMAL LOW (ref 12.0–15.0)
Immature Granulocytes: 0 %
Lymphocytes Relative: 32 %
Lymphs Abs: 1.5 10*3/uL (ref 0.7–4.0)
MCH: 28.2 pg (ref 26.0–34.0)
MCHC: 32.3 g/dL (ref 30.0–36.0)
MCV: 87.3 fL (ref 80.0–100.0)
Monocytes Absolute: 0.3 10*3/uL (ref 0.1–1.0)
Monocytes Relative: 6 %
Neutro Abs: 2.8 10*3/uL (ref 1.7–7.7)
Neutrophils Relative %: 62 %
Platelets: 161 10*3/uL (ref 150–400)
RBC: 4.11 MIL/uL (ref 3.87–5.11)
RDW: 14.6 % (ref 11.5–15.5)
WBC: 4.6 10*3/uL (ref 4.0–10.5)
nRBC: 0 % (ref 0.0–0.2)

## 2020-08-22 LAB — D-DIMER, QUANTITATIVE: D-Dimer, Quant: 0.62 ug/mL-FEU — ABNORMAL HIGH (ref 0.00–0.50)

## 2020-08-22 LAB — PROCALCITONIN: Procalcitonin: 0.14 ng/mL

## 2020-08-22 LAB — MAGNESIUM: Magnesium: 1.9 mg/dL (ref 1.7–2.4)

## 2020-08-22 MED ORDER — HYDRALAZINE HCL 25 MG PO TABS: 25.0000 mg | ORAL_TABLET | Freq: Three times a day (TID) | ORAL | 0 refills | Status: DC

## 2020-08-22 NOTE — TOC Transition Note (Signed)
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 Admitted for treatment of COVID Pneumonitis and assessment of chest pain  PTA, pt independent and living at home with minor children.  PT recommending no OP follow-up or DME.  Patient states that her sister will be staying with her to assist with care.  Patient denies any additional needs for home.    Final next level of care: Home/Self Care Barriers to Discharge: Barriers Resolved                       Discharge Plan and Services   Discharge Planning Services: CM Consult                                 Social Determinants of Health (SDOH) Interventions     Readmission Risk Interventions No flowsheet data found.  Reinaldo Raddle, RN, BSN  Trauma/Neuro ICU Case Manager [phone]

## 2020-08-22 NOTE — Plan of Care (Signed)

## 2020-08-22 NOTE — Plan of Care (Signed)
Problem: Education: Goal: Knowledge of General Education information will improve Description: Including pain rating scale, medication(s)/side effects and non-pharmacologic comfort measures 08/22/2020 1218 by Camillia Herter, RN Outcome: Adequate for Discharge 08/22/2020 0735 by Camillia Herter, RN Outcome: Progressing   Problem: Health Behavior/Discharge Planning: Goal: Ability to manage health-related needs will improve 08/22/2020 1218 by Camillia Herter, RN Outcome: Adequate for Discharge 08/22/2020 0735 by Camillia Herter, RN Outcome: Progressing   Problem: Clinical Measurements: Goal: Ability to maintain clinical measurements within normal limits will improve 08/22/2020 1218 by Camillia Herter, RN Outcome: Adequate for Discharge 08/22/2020 0735 by Camillia Herter, RN Outcome: Progressing Goal: Will remain free from infection 08/22/2020 1218 by Camillia Herter, RN Outcome: Adequate for Discharge 08/22/2020 0735 by Camillia Herter, RN Outcome: Progressing Goal: Diagnostic test results will improve 08/22/2020 1218 by Camillia Herter, RN Outcome: Adequate for Discharge 08/22/2020 0735 by Camillia Herter, RN Outcome: Progressing Goal: Respiratory complications will improve 08/22/2020 1218 by Camillia Herter, RN Outcome: Adequate for Discharge 08/22/2020 0735 by Camillia Herter, RN Outcome: Progressing Goal: Cardiovascular complication will be avoided 08/22/2020 1218 by Camillia Herter, RN Outcome: Adequate for Discharge 08/22/2020 0735 by Camillia Herter, RN Outcome: Progressing   Problem: Activity: Goal: Risk for activity intolerance will decrease 08/22/2020 1218 by Camillia Herter, RN Outcome: Adequate for Discharge 08/22/2020 0735 by Camillia Herter, RN Outcome: Progressing   Problem: Nutrition: Goal: Adequate nutrition will be maintained 08/22/2020 1218 by Camillia Herter, RN Outcome: Adequate for Discharge 08/22/2020 0735 by Camillia Herter, RN Outcome: Progressing   Problem:  Coping: Goal: Level of anxiety will decrease 08/22/2020 1218 by Camillia Herter, RN Outcome: Adequate for Discharge 08/22/2020 0735 by Camillia Herter, RN Outcome: Progressing   Problem: Elimination: Goal: Will not experience complications related to bowel motility 08/22/2020 1218 by Camillia Herter, RN Outcome: Adequate for Discharge 08/22/2020 0735 by Camillia Herter, RN Outcome: Progressing Goal: Will not experience complications related to urinary retention 08/22/2020 1218 by Camillia Herter, RN Outcome: Adequate for Discharge 08/22/2020 0735 by Camillia Herter, RN Outcome: Progressing   Problem: Pain Managment: Goal: General experience of comfort will improve 08/22/2020 1218 by Camillia Herter, RN Outcome: Adequate for Discharge 08/22/2020 0735 by Camillia Herter, RN Outcome: Progressing   Problem: Safety: Goal: Ability to remain free from injury will improve 08/22/2020 1218 by Camillia Herter, RN Outcome: Adequate for Discharge 08/22/2020 0735 by Camillia Herter, RN Outcome: Progressing   Problem: Skin Integrity: Goal: Risk for impaired skin integrity will decrease 08/22/2020 1218 by Camillia Herter, RN Outcome: Adequate for Discharge 08/22/2020 0735 by Camillia Herter, RN Outcome: Progressing   Problem: Education: Goal: Knowledge of risk factors and measures for prevention of condition will improve 08/22/2020 1218 by Camillia Herter, RN Outcome: Adequate for Discharge 08/22/2020 0735 by Camillia Herter, RN Outcome: Progressing   Problem: Coping: Goal: Psychosocial and spiritual needs will be supported 08/22/2020 1218 by Camillia Herter, RN Outcome: Adequate for Discharge 08/22/2020 0735 by Camillia Herter, RN Outcome: Progressing   Problem: Respiratory: Goal: Will maintain a patent airway 08/22/2020 1218 by Camillia Herter, RN Outcome: Adequate for Discharge 08/22/2020 0735 by Camillia Herter, RN Outcome: Progressing Goal: Complications related to the disease process, condition or  treatment will be avoided or minimized 08/22/2020 1218 by Camillia Herter, RN Outcome: Adequate for Discharge 08/22/2020 0735 by Camillia Herter, RN Outcome:  Progressing

## 2020-08-22 NOTE — Discharge Instructions (Signed)
COVID-19: What to Do if You Are Sick If you have a fever, cough or other symptoms, you might have COVID-19. Most people have mild illness and are able to recover at home. If you are sick:  Keep track of your symptoms.  If you have an emergency warning sign (including trouble breathing), call 911. Steps to help prevent the spread of COVID-19 if you are sick If you are sick with COVID-19 or think you might have COVID-19, follow the steps below to care for yourself and to help protect other people in your home and community. Stay home except to get medical care  Stay home. Most people with COVID-19 have mild illness and can recover at home without medical care. Do not leave your home, except to get medical care. Do not visit public areas.  Take care of yourself. Get rest and stay hydrated. Take over-the-counter medicines, such as acetaminophen, to help you feel better.  Stay in touch with your doctor. Call before you get medical care. Be sure to get care if you have trouble breathing, or have any other emergency warning signs, or if you think it is an emergency.  Avoid public transportation, ride-sharing, or taxis. Separate yourself from other people As much as possible, stay in a specific room and away from other people and pets in your home. If possible, you should use a separate bathroom. If you need to be around other people or animals in or outside of the home, wear a mask. Tell your close contactsthat they may have been exposed to COVID-19. An infected person can spread COVID-19 starting 48 hours (or 2 days) before the person has any symptoms or tests positive. By letting your close contacts know they may have been exposed to COVID-19, you are helping to protect everyone.  Additional guidance is available for those living in close quarters and shared housing.  See COVID-19 and Animals if you have questions about pets.  If you are diagnosed with COVID-19, someone from the health  department may call you. Answer the call to slow the spread. Monitor your symptoms  Symptoms of COVID-19 include fever, cough, or other symptoms.  Follow care instructions from your healthcare provider and local health department. Your local health authorities may give instructions on checking your symptoms and reporting information. When to seek emergency medical attention Look for emergency warning signs* for COVID-19. If someone is showing any of these signs, seek emergency medical care immediately:  Trouble breathing  Persistent pain or pressure in the chest  New confusion  Inability to wake or stay awake  Pale, gray, or blue-colored skin, lips, or nail beds, depending on skin tone *This list is not all possible symptoms. Please call your medical provider for any other symptoms that are severe or concerning to you. Call 911 or call ahead to your local emergency facility: Notify the operator that you are seeking care for someone who has or may have COVID-19. Call ahead before visiting your doctor  Call ahead. Many medical visits for routine care are being postponed or done by phone or telemedicine.  If you have a medical appointment that cannot be postponed, call your doctor's office, and tell them you have or may have COVID-19. This will help the office protect themselves and other patients. Get  tested  If you have symptoms of COVID-19, get tested. While waiting for test results, you stay away from others, including staying apart from those living in your household.  You can visit your state, tribal,  local, and territorialhealth department's website to look for the latest local information on testing sites. If you are sick, wear a mask over your nose and mouth  You should wear a mask over your nose and mouth if you must be around other people or animals, including pets (even at home).  You don't need to wear the mask if you are alone. If you can't put on a mask (because of  trouble breathing, for example), cover your coughs and sneezes in some other way. Try to stay at least 6 feet away from other people. This will help protect the people around you.  Masks should not be placed on young children under age 69 years, anyone who has trouble breathing, or anyone who is not able to remove the mask without help. Note: During the COVID-19 pandemic, medical grade facemasks are reserved for healthcare workers and some first responders. Cover your coughs and sneezes  Cover your mouth and nose with a tissue when you cough or sneeze.  Throw away used tissues in a lined trash can.  Immediately wash your hands with soap and water for at least 20 seconds. If soap and water are not available, clean your hands with an alcohol-based hand sanitizer that contains at least 60% alcohol. Clean your hands often  Wash your hands often with soap and water for at least 20 seconds. This is especially important after blowing your nose, coughing, or sneezing; going to the bathroom; and before eating or preparing food.  Use hand sanitizer if soap and water are not available. Use an alcohol-based hand sanitizer with at least 60% alcohol, covering all surfaces of your hands and rubbing them together until they feel dry.  Soap and water are the best option, especially if hands are visibly dirty.  Avoid touching your eyes, nose, and mouth with unwashed hands.  Handwashing Tips Avoid sharing personal household items  Do not share dishes, drinking glasses, cups, eating utensils, towels, or bedding with other people in your home.  Wash these items thoroughly after using them with soap and water or put in the dishwasher. Clean all "high-touch" surfaces everyday  Clean and disinfect high-touch surfaces in your "sick room" and bathroom; wear disposable gloves. Let someone else clean and disinfect surfaces in common areas, but you should clean your bedroom and bathroom, if possible.  If a  caregiver or other person needs to clean and disinfect a sick person's bedroom or bathroom, they should do so on an as-needed basis. The caregiver/other person should wear a mask and disposable gloves prior to cleaning. They should wait as long as possible after the person who is sick has used the bathroom before coming in to clean and use the bathroom. ? High-touch surfaces include phones, remote controls, counters, tabletops, doorknobs, bathroom fixtures, toilets, keyboards, tablets, and bedside tables.  Clean and disinfect areas that may have blood, stool, or body fluids on them.  Use household cleaners and disinfectants. Clean the area or item with soap and water or another detergent if it is dirty. Then, use a household disinfectant. ? Be sure to follow the instructions on the label to ensure safe and effective use of the product. Many products recommend keeping the surface wet for several minutes to ensure germs are killed. Many also recommend precautions such as wearing gloves and making sure you have good ventilation during use of the product. ? Use a product from [employer]: Disinfectants for Coronavirus ([id number]). ? Complete Disinfection Guidance When you can be  around others after being sick with COVID-19 Deciding when you can be around others is different for different situations. Find out when you can safely end home isolation. For any additional questions about your care, contact your healthcare provider or state or local health department. 10/16/2019 Content source: [facility] ([facility]), Division of Viral Diseases This information is not intended to replace advice given to you by your health care provider. Make sure you discuss any questions you have with your health care provider. Document Revised: 06/01/2020 Document Reviewed: 06/01/2020 Elsevier Patient Education  2021 [place] vs. Isolation QUARANTINE  keeps someone who was in close contact with someone who has COVID-19 away from others. Quarantine if you have been in close contact with someone who has COVID-19, unless you have been fully vaccinated.  COVID-19 Quarantine vs. Isolation QUARANTINE keeps someone who was in close contact with someone who has COVID-19 away from others. Quarantine if you have been in close contact with someone who has COVID-19, unless you have been fully vaccinated. If you are fully vaccinated  You do NOT need to quarantine unless they have symptoms  Get tested 3-5 days after your exposure, even if you don't have symptoms  Wear a mask indoors in public for 14 days following exposure or until your test result is negative If you are not fully vaccinated  Stay home for 14 days after your last contact with a person who has COVID-19  Watch for fever (100.2F), cough, shortness of breath, or other symptoms of COVID-19  If possible, stay away from people you live with, especially people who are at higher risk for getting very sick from COVID-19  Contact your local public health department for options in your area to possibly shorten your quarantine ISOLATION keeps someone who is sick or tested positive for COVID-19 without symptoms away from others, even in their own home. People who are in isolation should stay home and stay in a specific "sick room" or area and use a separate bathroom (if available). If you are sick and think or know you have COVID-19 Stay home until after  At least 10 days since symptoms first appeared and  At least 24 hours with no fever without the use of fever-reducing medications and  Symptoms have improved If you tested positive for COVID-19 but do not have symptoms  Stay home until after 10 days have passed since your positive viral test  If you develop symptoms after testing positive, follow the steps above for those who are sick [email or web address] 04/27/2020 This information  is not intended to replace advice given to you by your health care provider. Make sure you discuss any questions you have with your health care provider. Document Revised: 06/01/2020 Document Reviewed: 06/01/2020 Elsevier Patient Education  2021 [place] Can Do to Manage Your COVID-19 Symptoms at Home If you have possible or confirmed COVID-19: 1. Stay home except to get medical care. 2. Monitor your symptoms carefully. If your symptoms get worse, call your healthcare provider immediately. 3. Get rest and stay hydrated. 4. If you have a medical appointment, call the healthcare provider ahead of time and tell them that you have or may have COVID-19. 5. For medical emergencies, call 911 and notify the dispatch personnel that you have or may have COVID-19. 6. Cover your cough and sneezes with a tissue or use the inside of your elbow. 7. Wash your hands often with soap and water  for at least 20 seconds or clean your hands with an alcohol-based hand sanitizer that contains at least 60% alcohol. 8. As much as possible, stay in a specific room and away from other people in your home. Also, you should use a separate bathroom, if available. If you need to be around other people in or outside of the home, wear a mask. 9. Avoid sharing personal items with other people in your household, like dishes, towels, and bedding. 10. Clean all surfaces that are touched often, like counters, tabletops, and doorknobs. Use household cleaning sprays or wipes according to the label instructions. [email or web address] 02/14/2020 This information is not intended to replace advice given to you by your health care provider. Make sure you discuss any questions you have with your health care provider. Document Revised: 06/01/2020 Document Reviewed: 06/01/2020 Elsevier Patient Education  2021 [place] Can Do to Manage Your COVID-19 Symptoms at Home If you have possible or confirmed  COVID-19: 11. Stay home except to get medical care. 12. Monitor your symptoms carefully. If your symptoms get worse, call your healthcare provider immediately. 13. Get rest and stay hydrated. 14. If you have a medical appointment, call the healthcare provider ahead of time and tell them that you have or may have COVID-19. 15. For medical emergencies, call 911 and notify the dispatch personnel that you have or may have COVID-19. 16. Cover your cough and sneezes with a tissue or use the inside of your elbow. 43. Wash your hands often with soap and water for at least 20 seconds or clean your hands with an alcohol-based hand sanitizer that contains at least 60% alcohol. 18. As much as possible, stay in a specific room and away from other people in your home. Also, you should use a separate bathroom, if available. If you need to be around other people in or outside of the home, wear a mask. 19. Avoid sharing personal items with other people in your household, like dishes, towels, and bedding. 20. Clean all surfaces that are touched often, like counters, tabletops, and doorknobs. Use household cleaning sprays or wipes according to the label instructions. [email or web address] 02/14/2020 This information is not intended to replace advice given to you by your health care provider. Make sure you discuss any questions you have with your health care provider. Document Revised: 06/01/2020 Document Reviewed: 06/01/2020 Elsevier Patient Education  2021 [employer]. If you are fully vaccinated  You do NOT need to quarantine unless they have symptoms  Get tested 3-5 days after your exposure, even if you don't have symptoms  Wear a mask indoors in public for 14 days following exposure or until your test result is negative If you are not fully vaccinated  Stay home for 14 days after your last contact with a person who has COVID-19  Watch for fever (100.61F), cough, shortness of breath, or other symptoms of  COVID-19  If possible, stay away from people you live with, especially people who are at higher risk for getting very sick from [place] your local public health department for options in your area to possibly shorten your quarantine ISOLATION keeps someone who is sick or tested positive for COVID-19 without symptoms away from others, even in their own home. People who are in isolation should stay home and stay in a specific "sick room" or area and use a separate bathroom (if available). If you are sick and think or know you have COVID-19 Stay home until after  At least 10 days since symptoms first appeared and  At least 24 hours with no fever without the use of fever-reducing medications and  Symptoms have improved If you tested positive for COVID-19 but do not have symptoms  Stay home until after 10 days have passed since your positive viral test  If you develop symptoms after testing positive, follow the steps above for those who are sick [email or web address] 04/27/2020 This information is not intended to replace advice given to you by your health care provider. Make sure you discuss any questions you have with your health care provider. Document Revised: 06/01/2020 Document Reviewed: 06/01/2020 Elsevier Patient Education  2021 [place]. Follow with Primary MD [place], [place] in 7 days   Get CBC, CMP, 2 view Chest X ray -  checked next visit within 1 week by Primary MD    Activity: As tolerated with Full fall precautions use [place]/cane & assistance as needed  Disposition Home    Diet: Heart Healthy   Special Instructions: If you have smoked or chewed Tobacco  in the last 2 yrs please stop smoking, stop any regular Alcohol  and or any Recreational drug use.  On your next visit with your primary care physician please Get Medicines reviewed and adjusted.  Please request your Prim.MD to go over all Hospital Tests and Procedure/Radiological results at the  follow up, please get all Hospital records sent to your Prim MD by signing hospital release before you go home.  If you experience worsening of your admission symptoms, develop shortness of breath, life threatening emergency, suicidal or homicidal thoughts you must seek medical attention immediately by calling 911 or calling your MD immediately  if symptoms less severe.  You Must read complete instructions/literature along with all the possible adverse reactions/side effects for all the Medicines you take and that have been prescribed to you. Take any new Medicines after you have completely understood and accpet all the possible adverse reactions/side effects.

## 2020-08-22 NOTE — Progress Notes (Signed)
   08/21/20 2153  Assess: MEWS Score  Temp (!) 102.5 F (39.2 C)  BP (!) 146/79  ECG Heart Rate 93  Resp (!) 22  Level of Consciousness Alert  SpO2 93 %  O2 Device Room Air  Assess: MEWS Score  MEWS Temp 2  MEWS Systolic 0  MEWS Pulse 0  MEWS RR 1  MEWS LOC 0  MEWS Score 3  MEWS Score Color Yellow  Assess: if the MEWS score is Yellow or Red  Were vital signs taken at a resting state? Yes  Focused Assessment No change from prior assessment  Early Detection of Sepsis Score *See Row Information* Medium  MEWS guidelines implemented *See Row Information* Yes  Treat  MEWS Interventions Administered prn meds/treatments  Pain Scale 0-10  Pain Score 0  Take Vital Signs  Increase Vital Sign Frequency  Yellow: Q 2hr X 2 then Q 4hr X 2, if remains yellow, continue Q 4hrs  Escalate  MEWS: Escalate Yellow: discuss with charge nurse/RN and consider discussing with provider and RRT  Notify: Charge Nurse/RN  Name of Charge Nurse/RN Notified Levada Dy  Date Charge Nurse/RN Notified 08/21/20  Time Charge Nurse/RN Notified 2200  Document  Patient Outcome Other (Comment) (stable, remains on unit)

## 2020-08-22 NOTE — Discharge Summary (Addendum)
Dawn Thomas YA:[id number] DOB: [birth year] DOA: 08/19/2020  PCP: Virginia Rochester, PA  Admit date: 08/19/2020  Discharge date: 08/22/2020  Admitted From: Home   Disposition:  Home   Recommendations for Outpatient Follow-up:   Follow up with PCP in 1-2 weeks  PCP Please obtain BMP/CBC, 2 view CXR in 1week,  (see Discharge instructions)   PCP Please follow up on the following pending results: Check CMP, CBC and a two-view chest x-ray in 7 to 10 days.  Note ARB has been held upon discharge due to AKI.  Needs outpatient cardiology and nephrology follow-up in 1 to 2 weeks.   Home Health: None   Equipment/Devices: None  Consultations: Cards Discharge Condition: Stable    CODE STATUS: Full    Diet Recommendation: Heart Healthy     Chief Complaint  Patient presents with  . 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  He was diagnosed with mild COVID-19 pneumonia and admitted to the hospital.                                                                 Hospital Course    1. Acute Covid 19 Viral Pneumonitis  - she is unvaccinated and fortunately had minimal parenchymal lung injury treated with 3-day course of remdesivir and steroids, symptom-free on room air and will be discharged home with outpatient PCP follow-up within a week.    Recent Labs  Lab 08/19/20 1726 08/19/20 1818 08/20/20 0913 08/20/20 1150 08/21/20 0214 08/21/20 0917 08/22/20 0259  WBC 4.6  --  4.5  --  3.6*  --  4.6  CRP   --   --  1.6*  --  1.2*  --  3.3*  DDIMER  --  0.80* 0.76*  --  0.79*  --  0.62*  BNP 41.1  --   --   --   --  64.9  --   PROCALCITON  --   --   --  0.16  --   --  0.14  AST  --   --  33  --  30  --  26  ALT  --   --  22  --  22  --  19  ALKPHOS  --   --  49  --  51  --  47  BILITOT  --   --  0.6  --  0.1*  --  0.5  ALBUMIN  --   --  3.0*  --  2.8*  --  2.5*  2. Cough related chest pain with nonspecific non-ACS pattern troponin rise.  EKG nonacute, echo stable, reviewed by cardiology, outpatient follow-up.  No further work-up.   3.  AKI on CKD 3B.  Baseline creatinine around 2.  Creatinine has plateaued around 2.6, ARB on hold upon discharge replaced with hydralazine, PCP to recheck renal function in 7 to 10 days, outpatient nephrology follow-up recommended.  Currently good urine output and wants to go home.  4.  Essential hypertension.  Is on nifedipine along with ARB, ARB held replaced with hydralazine.  Monitor.  5.  Hypothyroidism.  On Synthroid.  6.  History of peripartum cardiomyopathy in the past.  Currently stable, outpt Cards follow up DW Dr Dwyane Dee 08/21/20.    TTE - 1. Left ventricular ejection fraction, by estimation, is 50 to 55%. The left ventricle has low normal function. The left ventricle has no regional wall motion abnormalities. There is mild concentric left ventricular hypertrophy. Left ventricular diastolic parameters are indeterminate.  2. Right ventricular systolic function is normal. The right ventricular size is normal.  3. The mitral valve is normal in structure. Trivial mitral valve regurgitation.  4. The aortic valve is grossly normal. There is mild calcification of the aortic valve. Aortic valve regurgitation is moderate. Mild aortic valve sclerosis is present, with no evidence of aortic valve stenosis.  5. The inferior vena cava is normal in size with greater than 50% respiratory variability, suggesting right atrial pressure of 3 mmHg.  Comparison(s): Changes from prior study are noted. EF improved since prior.  CT - 1. Examination is limited by respiratory motion artifact and patient body habitus. Given this limitation, there is no evidence for pulmonary embolus. 2. Ground-glass airspace opacities are noted throughout both lung fields, concerning for an atypical infectious process such as viral pneumonia. 3. Trace left-sided pleural effusion. 4. Cardiomegaly with coronary artery disease. Aortic Atherosclerosis.  Leg [place] - no DVT    Discharge diagnosis     Principal Problem:   COVID-19 virus infection Active Problems:   Acute respiratory failure with hypoxia (HCC)   Elevated troponin   Essential hypertension   Chronic kidney disease, stage 3b (HCC)   Chest pain    Discharge instructions    Discharge Instructions    Diet - low sodium heart healthy   Complete by: As directed    Discharge instructions   Complete by: As directed    Follow with Primary MD Virginia Rochester, PA in 7 days   Get CBC, CMP, 2 view Chest X ray -  checked next visit within 1 week by Primary MD    Activity: As tolerated with Full fall precautions use Eichenberger/cane & assistance as needed  Disposition Home    Diet: Heart Healthy   Special Instructions: If you have smoked or chewed Tobacco  in the last 2 yrs please stop smoking, stop any regular Alcohol  and or any Recreational drug use.  On your next visit with your primary care physician please Get Medicines reviewed and adjusted.  Please request your Prim.MD to go over all Hospital Tests and Procedure/Radiological results at the follow up, please get all Hospital records sent to your Prim MD by signing hospital release before you go home.  If you experience worsening of your admission symptoms, develop shortness of breath, life threatening emergency, suicidal or homicidal thoughts you must seek medical attention immediately by calling 911 or calling your MD immediately  if symptoms  less severe.  You Must read complete instructions/literature along with  all the possible adverse reactions/side effects for all the Medicines you take and that have been prescribed to you. Take any new Medicines after you have completely understood and accpet all the possible adverse reactions/side effects.   Increase activity slowly   Complete by: As directed       Discharge Medications   Allergies as of 08/22/2020      Reactions   Maxalt [rizatriptan Benzoate] Shortness Of Breath   Nsaids Other (See Comments)   Renal insufficiency.   Omeprazole Hives   Adhesive [tape] Other (See Comments)   Burns skin   Hydrocodone Hives   Penicillins Hives   Zithromax [azithromycin] Other (See Comments)   "messes with my breathing"      Medication List    STOP taking these medications   doxylamine (Sleep) 25 MG tablet Commonly known as: UNISOM   furosemide 40 MG tablet Commonly known as: LASIX   labetalol 100 MG tablet Commonly known as: NORMODYNE   losartan 100 MG tablet Commonly known as: COZAAR   pyridOXINE 50 MG tablet Commonly known as: B-6     TAKE these medications   acetaminophen 500 MG tablet Commonly known as: TYLENOL Take 1,000 mg by mouth every 6 (six) hours as needed for mild pain.   albuterol 108 (90 Base) MCG/ACT inhaler Commonly known as: VENTOLIN HFA Inhale 2 puffs into the lungs every 6 (six) hours as needed for wheezing or shortness of breath.   Ergocalciferol 50 MCG (2000 UT) Tabs Take 2,000 Units by mouth daily.   hydrALAZINE 25 MG tablet Commonly known as: APRESOLINE Take 1 tablet (25 mg total) by mouth every 8 (eight) hours.   levothyroxine 75 MCG tablet Commonly known as: SYNTHROID Take 75 mcg by mouth daily.   NIFEdipine 60 MG 24 hr tablet Commonly known as: ADALAT CC Take 60 mg by mouth 2 (two) times daily.   pantoprazole 40 MG tablet Commonly known as: PROTONIX Take 40 mg by mouth daily.   prenatal multivitamin Tabs tablet Take 1  tablet by mouth daily at 12 noon.   sertraline 50 MG tablet Commonly known as: ZOLOFT Take 50 mg by mouth daily.   vitamin C 1000 MG tablet Take 1,000 mg by mouth daily.   zinc gluconate 50 MG tablet Take 50 mg by mouth daily.        Follow-up Information    Werner Lean, MD Follow up on 09/17/2020.   Specialty: Cardiology Why: at 9:40 AM Contact information: [place] [address] [phone]        Virginia Rochester, [place]. Schedule an appointment as soon as possible for a visit in 1 week(s).   Specialty: Family Medicine Contact information: [place] [id number] [place] [address] [phone]               Major procedures and Radiology Reports - PLEASE review detailed and final reports thoroughly  -       CT Angio Chest PE W and/or Wo Contrast  Result Date: 08/20/2020 CLINICAL DATA:  Productive cough and fever. Positive D-dimer. History of sleep apnea. EXAM: CT ANGIOGRAPHY CHEST WITH CONTRAST TECHNIQUE: Multidetector CT imaging of the chest was performed using the standard protocol during bolus administration of intravenous contrast. Multiplanar CT image reconstructions and MIPs were obtained to evaluate the vascular anatomy. CONTRAST:  55m OMNIPAQUE IOHEXOL 350 MG/ML SOLN COMPARISON:  CT dated July 11, 2018. FINDINGS: Cardiovascular: Examination is limited by respiratory motion artifact and patient  body habitus. There is no pulmonary embolus or evidence of right heart strain. The size of the main pulmonary artery is normal. Cardiomegaly with coronary artery calcification. The course and caliber of the aorta are normal. There is mild atherosclerotic calcification. Opacification decreased due to pulmonary arterial phase contrast bolus timing. Mediastinum/Nodes: -- No mediastinal lymphadenopathy. -- No hilar lymphadenopathy. -- No axillary lymphadenopathy. -- No supraclavicular lymphadenopathy. -- Normal thyroid gland where  visualized. -  Unremarkable esophagus. Lungs/Pleura: Ground-glass airspace opacities are noted throughout both lung fields. There is no pneumothorax. The trachea is unremarkable. There is a trace left-sided pleural effusion. Upper Abdomen: Contrast bolus timing is not optimized for evaluation of the abdominal organs. The visualized portions of the organs of the upper abdomen are normal. Musculoskeletal: No chest wall abnormality. No bony spinal canal stenosis. Review of the MIP images confirms the above findings. IMPRESSION: 1. Examination is limited by respiratory motion artifact and patient body habitus. Given this limitation, there is no evidence for pulmonary embolus. 2. Ground-glass airspace opacities are noted throughout both lung fields, concerning for an atypical infectious process such as viral pneumonia. 3. Trace left-sided pleural effusion. 4. Cardiomegaly with coronary artery disease. Aortic Atherosclerosis (ICD10-I70.0). Electronically Signed   By: Constance Holster M.D.   On: 08/20/2020 06:52   US Venous Img Lower Bilateral  Result Date: 08/19/2020 CLINICAL DATA:  COVID positive with shortness of breath. EXAM: BILATERAL LOWER EXTREMITY VENOUS DOPPLER ULTRASOUND TECHNIQUE: Gray-scale sonography with compression, as well as color and duplex ultrasound, were performed to evaluate the deep venous system(s) from the level of the common femoral vein through the popliteal and proximal calf veins. COMPARISON:  None. FINDINGS: VENOUS Normal compressibility of the common femoral, superficial femoral, and popliteal veins, as well as the visualized calf veins. Visualized portions of profunda femoral vein and great saphenous vein unremarkable. No filling defects to suggest DVT on grayscale or color Doppler imaging. Doppler waveforms show normal direction of venous flow, normal respiratory plasticity and response to augmentation. Limited views of the contralateral common femoral vein are unremarkable. OTHER  None. Limitations: none IMPRESSION: Negative. Electronically Signed   By: Virgina Norfolk M.D.   On: 08/19/2020 19:56   DG Chest Port 1 View  Result Date: 08/22/2020 CLINICAL DATA:  Shortness of breath.  History of COVID-19. EXAM: PORTABLE CHEST 1 VIEW COMPARISON:  Chest radiograph 08/19/2020. FINDINGS: Monitoring leads overlie the patient. Stable enlarged cardiac and mediastinal contours. Low lung volumes. Slight interval increase in bilateral patchy airspace opacities. No definite pleural effusion or pneumothorax. Thoracic spine degenerative changes. IMPRESSION: Slight interval increase in bilateral patchy airspace opacities. Electronically Signed   By: Lovey Newcomer M.D.   On: 08/22/2020 08:43   DG Chest Portable 1 View  Result Date: 08/19/2020 CLINICAL DATA:  Cough and fever COVID EXAM: PORTABLE CHEST 1 VIEW COMPARISON:  07/13/2018 FINDINGS: Mild cardiomegaly with central congestion. Possible vague peripheral and basilar opacities. No pleural effusion or pneumothorax. IMPRESSION: Cardiomegaly with mild central congestion. Possible vague peripheral and basilar opacities/small foci of pneumonia. Electronically Signed   By: Donavan Foil M.D.   On: 08/19/2020 15:42   ECHOCARDIOGRAM LIMITED  Result Date: 08/20/2020    ECHOCARDIOGRAM LIMITED REPORT   Patient Name:   BETHENA BASCUE Date of Exam: 08/20/2020 Medical Rec #:  [id number]     Height:       66.0 in Accession #:    [id number]    Weight:       309.1 lb Date of Birth:  1975/10/23  BSA:          2.406 m Patient Age:    45 years      BP:           148/84 mmHg Patient Gender: F             HR:           80 bpm. Exam Location:  Inpatient Procedure: Limited Echo, Cardiac Doppler and Color Doppler Indications:    Elevated Troponin  History:        Patient has prior history of Echocardiogram examinations, most                 recent 07/12/2018. Risk Factors:Hypertension, Sleep Apnea and                 Non-Smoker.  Sonographer:    Vickie Epley RDCS  Referring Phys: [id number] Sherryll Burger [facility]  Sonographer Comments: Covid positive. IMPRESSIONS  1. Left ventricular ejection fraction, by estimation, is 50 to 55%. The left ventricle has low normal function. The left ventricle has no regional wall motion abnormalities. There is mild concentric left ventricular hypertrophy. Left ventricular diastolic parameters are indeterminate.  2. Right ventricular systolic function is normal. The right ventricular size is normal.  3. The mitral valve is normal in structure. Trivial mitral valve regurgitation.  4. The aortic valve is grossly normal. There is mild calcification of the aortic valve. Aortic valve regurgitation is moderate. Mild aortic valve sclerosis is present, with no evidence of aortic valve stenosis.  5. The inferior vena cava is normal in size with greater than 50% respiratory variability, suggesting right atrial pressure of 3 mmHg. Comparison(s): Changes from prior study are noted. EF improved since prior. FINDINGS  Left Ventricle: Left ventricular ejection fraction, by estimation, is 50 to 55%. The left ventricle has low normal function. The left ventricle has no regional wall motion abnormalities. The left ventricular internal cavity size was normal in size. There is mild concentric left ventricular hypertrophy. Left ventricular diastolic parameters are indeterminate. Right Ventricle: The right ventricular size is normal. No increase in right ventricular wall thickness. Right ventricular systolic function is normal. Left Atrium: Left atrial size was not well visualized. Right Atrium: Right atrial size was not well visualized. Pericardium: There is no evidence of pericardial effusion. Mitral Valve: The mitral valve is normal in structure. There is mild thickening of the mitral valve leaflet(s). Trivial mitral valve regurgitation. Tricuspid Valve: The tricuspid valve is normal in structure. Tricuspid valve regurgitation is trivial. No evidence of tricuspid  stenosis. Aortic Valve: The aortic valve is grossly normal. There is mild calcification of the aortic valve. Aortic valve regurgitation is moderate. Mild aortic valve sclerosis is present, with no evidence of aortic valve stenosis. Pulmonic Valve: The pulmonic valve was grossly normal. Pulmonic valve regurgitation is mild. Aorta: The aortic root, ascending aorta, aortic arch and descending aorta are all structurally normal, with no evidence of dilitation or obstruction. Venous: The inferior vena cava is normal in size with greater than 50% respiratory variability, suggesting right atrial pressure of 3 mmHg. IAS/Shunts: The interatrial septum was not well visualized. LEFT VENTRICLE PLAX 2D LVIDd:         4.97 cm      Diastology LVIDs:         3.26 cm      LV e' medial:    4.13 cm/s LV PW:         1.23 cm  LV E/e' medial:  15.4 LV IVS:        1.49 cm      LV e' lateral:   6.30 cm/s LVOT diam:     2.60 cm      LV E/e' lateral: 10.1 LV SV:         122 LV SV Index:   51 LVOT Area:     5.31 cm  LV Volumes (MOD) LV vol d, MOD A2C: 253.0 ml LV vol d, MOD A4C: 251.0 ml LV vol s, MOD A2C: 115.0 ml LV vol s, MOD A4C: 101.0 ml LV SV MOD A2C:     138.0 ml LV SV MOD A4C:     251.0 ml LV SV MOD BP:      143.1 ml RIGHT VENTRICLE RV S prime:     17.30 cm/s TAPSE (M-mode): 2.2 cm LEFT ATRIUM         Index LA diam:    4.30 cm 1.79 cm/m  AORTIC VALVE LVOT Vmax:   126.00 cm/s LVOT Vmean:  94.800 cm/s LVOT VTI:    0.229 m  AORTA Ao Root diam: 3.80 cm Ao Asc diam:  3.40 cm MITRAL VALVE MV Area (PHT): 3.12 cm    SHUNTS MV Decel Time: 243 msec    Systemic VTI:  0.23 m MV E velocity: 63.40 cm/s  Systemic Diam: 2.60 cm MV A velocity: 63.40 cm/s MV E/A ratio:  1.00 Buford Dresser MD Electronically signed by Buford Dresser MD Signature Date/Time: 08/20/2020/6:08:31 PM    Final     Micro Results     No results found for this or any previous visit (from the past 240 hour(s)).  Today   Subjective    Jozalyn Walkenhorst  today has no headache,no chest abdominal pain,no new weakness tingling or numbness, feels much better wants to go home today.     Objective   Blood pressure 116/80, pulse 86, temperature 98.7 F (37.1 C), temperature source Oral, resp. rate 16, height '5\' 6"'$  (1.676 m), weight (!) 145.4 kg, last menstrual period 07/16/2020, SpO2 90 %, unknown if currently breastfeeding.   Intake/Output Summary (Last 24 hours) at 08/22/2020 1112 Last data filed at 08/22/2020 0908 Gross per 24 hour  Intake -  Output 1100 ml  Net -1100 ml    Exam  Awake Alert, No new F.N deficits, Normal affect [place].AT,PERRAL Supple Neck,No JVD, No cervical lymphadenopathy appriciated.  Symmetrical Chest wall movement, Good air movement bilaterally, CTAB RRR,No Gallops,Rubs or new Murmurs, No Parasternal Heave +ve B.Sounds, Abd Soft, Non tender, No organomegaly appriciated, No rebound -guarding or rigidity. No Cyanosis, Clubbing or edema, No new Rash or bruise   Data Review   CBC w Diff:  Lab Results  Component Value Date   WBC 4.6 08/22/2020   HGB 11.6 (L) 08/22/2020   HCT 35.9 (L) 08/22/2020   PLT 161 08/22/2020   LYMPHOPCT 32 08/22/2020   MONOPCT 6 08/22/2020   EOSPCT 0 08/22/2020   BASOPCT 0 08/22/2020    CMP:  Lab Results  Component Value Date   NA 139 08/22/2020   K 4.3 08/22/2020   CL 111 08/22/2020   CO2 18 (L) 08/22/2020   BUN 29 (H) 08/22/2020   CREATININE 2.67 (H) 08/22/2020   PROT 5.7 (L) 08/22/2020   ALBUMIN 2.5 (L) 08/22/2020   BILITOT 0.5 08/22/2020   ALKPHOS 47 08/22/2020   AST 26 08/22/2020   ALT 19 08/22/2020  .   Total Time in preparing paper work, data  evaluation and todays exam - 35 minutes  Lala Lund M.D on 08/22/2020 at 11:12 AM  [facility]

## 2020-09-17 ENCOUNTER — Ambulatory Visit (INDEPENDENT_AMBULATORY_CARE_PROVIDER_SITE_OTHER): Payer: Medicaid Other | Admitting: Internal Medicine

## 2020-09-17 ENCOUNTER — Encounter: Payer: Self-pay | Admitting: Internal Medicine

## 2020-09-17 ENCOUNTER — Other Ambulatory Visit: Payer: Self-pay

## 2020-09-17 VITALS — BP 162/90 | HR 70 | Ht 66.5 in | Wt 315.0 lb

## 2020-09-17 DIAGNOSIS — I10 Essential (primary) hypertension: Secondary | ICD-10-CM

## 2020-09-17 DIAGNOSIS — O903 Peripartum cardiomyopathy: Secondary | ICD-10-CM

## 2020-09-17 DIAGNOSIS — N1832 Chronic kidney disease, stage 3b: Secondary | ICD-10-CM | POA: Diagnosis not present

## 2020-09-17 MED ORDER — HYDRALAZINE HCL 50 MG PO TABS: 50.0000 mg | ORAL_TABLET | Freq: Three times a day (TID) | ORAL | 3 refills | Status: AC

## 2020-09-17 NOTE — Patient Instructions (Signed)
Medication Instructions:  1) INCREASE HYDRALAZINE to 50 mg three times daily *If you need a refill on your cardiac medications before your next appointment, please call your pharmacy*  Follow-Up: At [facility], you and your health needs are our priority.  As part of our continuing mission to provide you with exceptional heart care, we have created designated Provider Care Teams.  These Care Teams include your primary Cardiologist (physician) and Advanced Practice Providers (APPs -  Physician Assistants and Nurse Practitioners) who all work together to provide you with the care you need, when you need it. Your next appointment:   6 month(s) The format for your next appointment:   In Person Provider:   You may see Werner Lean, MD or one of the following Advanced Practice Providers on your designated Care Team:    Melina Copa, PA-C  Ermalinda Barrios, PA-C

## 2020-09-17 NOTE — Progress Notes (Signed)
Cardiology Office Note:    Date:  09/17/2020   ID:  [name], DOB Apr 19, 1976, MRN [id number]  PCP:  Virginia [place], [place]  Cardiologist:  Werner Lean, MD  Advanced Practice Provider:  No care team member to display Electrophysiologist:  None       CC: Follow up cardiomyopathy and Covid-19  History of Present Illness:    Sheree Nagi is a 45 y.o. female with a hx of Morbid Obesity, CKD Stage 3b, HTN, Migraines; with history of peripartum cardiomyopathy.  Seen for Chest pains in the setting of COVID-19 08/20/20.  This has resolved and her EF had improved, but her kidney function is worse.  Had bee stopped on her losartan for this; [place]. Carlean [place].  Patient notes that she is doing well.  Since last visit notes her kidney is worse changes.  Relevant interval testing or therapy include no losartan.  There are no interval hospital/ED re-admission.    No chest pain or pressure .  No SOB/DOE and no PND/Orthopnea.  Notes weight loss or leg swelling.  No palpitations or syncope.  Ambulatory blood pressure 150/78.   Past Medical History:  Diagnosis Date  . Asthma   . Complication of anesthesia    woke up during surgery   . Hypertension   . Hypothyroidism   . Migraines   . 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 Renal disorder    stage 3- Dr [facility] - Dr Olivia Mackie  . Renal insufficiency   . Sleep apnea    mild   . Tendonitis   . Thyroid disease     Past Surgical History:  Procedure Laterality Date  . CESAREAN SECTION    . CHOLECYSTECTOMY    . DILATION AND CURETTAGE OF UTERUS    . LAPAROSCOPIC GASTRIC SLEEVE RESECTION N/A 05/30/2017   Procedure: LAPAROSCOPIC GASTRIC SLEEVE RESECTION WITH UPPPER ENDO AND HIATAL HERNIA REPAIR;  Surgeon: Mickeal Skinner, MD;  Location: [facility];  Service: General;  Laterality: N/A;    Current Medications: Current Meds  Medication Sig  . acetaminophen (TYLENOL) 500 MG  tablet Take 1,000 mg by mouth every 6 (six) hours as needed for mild pain.  Marland Kitchen albuterol (PROVENTIL HFA;VENTOLIN HFA) 108 (90 BASE) MCG/ACT inhaler Inhale 2 puffs into the lungs every 6 (six) hours as needed for wheezing or shortness of breath.   . Ergocalciferol 50 MCG (2000 UT) TABS Take 2,000 Units by mouth daily.  Marland Kitchen levothyroxine (SYNTHROID, LEVOTHROID) 75 MCG tablet Take 75 mcg by mouth daily.   Marland Kitchen NIFEdipine (ADALAT CC) 60 MG 24 hr tablet Take 60 mg by mouth 2 (two) times daily.  . pantoprazole (PROTONIX) 40 MG tablet Take 40 mg by mouth daily.  . Prenatal Vit-Fe Fumarate-FA (PRENATAL MULTIVITAMIN) TABS tablet Take 1 tablet by mouth daily at 12 noon.  . sertraline (ZOLOFT) 50 MG tablet Take 50 mg by mouth daily.  . [DISCONTINUED] hydrALAZINE (APRESOLINE) 25 MG tablet Take 1 tablet (25 mg total) by mouth every 8 (eight) hours.     Allergies:   Maxalt [rizatriptan benzoate], Nsaids, Omeprazole, Adhesive [tape], Hydrocodone, Penicillins, and Zithromax [azithromycin]   Social History   Socioeconomic History  . Marital status: Single    Spouse name: Not on file  . Number of children: Not on file  . Years of education: Not on file  . Highest education level: Not on file  Occupational History  . Not on file  Tobacco  Use  . Smoking status: Never Smoker  . Smokeless tobacco: Never Used  Vaping Use  . Vaping Use: Never used  Substance and Sexual Activity  . Alcohol use: No  . Drug use: No  . Sexual activity: Not on file  Other Topics Concern  . Not on file  Social History Narrative  . Not on file   Social Determinants of Health   Financial Resource Strain: Not on file  Food Insecurity: Not on file  Transportation Needs: Not on file  Physical Activity: Not on file  Stress: Not on file  Social Connections: Not on file     Family History: The patient's family history includes Diabetes in her father and mother; Hypertension in her father and mother; Sudden death in her father.  There is no history of Heart attack or Hyperlipidemia.  ROS:   Please see the history of present illness.     All other systems reviewed and are negative.  EKGs/Labs/Other Studies Reviewed:    The following studies were reviewed today:  EKG:   Sr rate 71 low voltage (precordial)  Recent Labs: 08/21/2020: B Natriuretic Peptide 64.9 08/22/2020: ALT 19; BUN 29; Creatinine, Ser 2.67; Hemoglobin 11.6; Magnesium 1.9; Platelets 161; Potassium 4.3; Sodium 139  Recent Lipid Panel No results found for: CHOL, TRIG, HDL, CHOLHDL, VLDL, LDLCALC, LDLDIRECT   Risk Assessment/Calculations:     N/A  Physical Exam:    VS:  BP (!) 162/90   Pulse 70   Ht 5' 6.5" (1.689 m)   Wt (!) 315 lb (142.9 kg)   BMI 50.08 kg/m     Wt Readings from Last 3 Encounters:  09/17/20 (!) 315 lb (142.9 kg)  08/21/20 (!) 320 lb 8.8 oz (145.4 kg)  01/21/19 (!) 307 lb (139.3 kg)    GEN: Obese female well developed in no acute distress HEENT: Normal NECK: No JVD; No carotid bruits LYMPHATICS: No lymphadenopathy CARDIAC: RRR, no murmurs, rubs, gallops RESPIRATORY:  Clear to auscultation without rales, wheezing or rhonchi  ABDOMEN: Soft, non-tender, non-distended MUSCULOSKELETAL:  No edema; No deformity  SKIN: Warm and dry NEUROLOGIC:  Alert and oriented x 3 PSYCHIATRIC:  Normal affect   ASSESSMENT:    1. Essential hypertension   2. Peripartum cardiomyopathy   3. Chronic kidney disease, stage 3b ([place])   4. Morbid obesity ([place])    PLAN:    In order of problems listed above:  Essential Hypertension CKD Stage IIIb Morbid Obesity History of peripartum cardiomyopathy History of COVID-19 - ambulatory blood pressure not done, will restart/continue ambulatory BP monitoring; gave education on how to perform ambulatory blood pressure monitoring including the frequency and technique; goal ambulatory blood pressure < 135/85 on average - continue home medications with the exception of transition hydralazine to  50 mg TIDl; when controlled could move to BID - OSA Risk low, Epworth Sleepiness Scale less than 15 - discussed diet (DASH/low sodium), and exercise/weight loss interventions   Six month follow up unless new symptoms or abnormal test results warranting change in plan  Would be reasonable for  Video Visit Follow up Would be reasonable for  APP Follow up Will send results to [facility]    Medication Adjustments/Labs and Tests Ordered: Current medicines are reviewed at length with the patient today.  Concerns regarding medicines are outlined above.  No orders of the defined types were placed in this encounter.  Meds ordered this encounter  Medications  . hydrALAZINE (APRESOLINE) 50 MG tablet    Sig: Take  1 tablet (50 mg total) by mouth every 8 (eight) hours.    Dispense:  270 tablet    Refill:  3    Patient Instructions  Medication Instructions:  1) INCREASE HYDRALAZINE to 50 mg three times daily *If you need a refill on your cardiac medications before your next appointment, please call your pharmacy*  Follow-Up: At [facility], you and your health needs are our priority.  As part of our continuing mission to provide you with exceptional heart care, we have created designated Provider Care Teams.  These Care Teams include your primary Cardiologist (physician) and Advanced Practice Providers (APPs -  Physician Assistants and Nurse Practitioners) who all work together to provide you with the care you need, when you need it. Your next appointment:   6 month(s) The format for your next appointment:   In Person Provider:   You may see Werner Lean, MD or one of the following Advanced Practice Providers on your designated Care Team:    Melina Copa, PA-C  Ermalinda Barrios, PA-C      Signed, Werner Lean, MD  09/17/2020 10:21 AM    [place]

## 2020-12-18 ENCOUNTER — Encounter (HOSPITAL_COMMUNITY): Payer: Self-pay | Admitting: *Deleted

## 2021-04-01 ENCOUNTER — Ambulatory Visit: Payer: Medicaid Other | Admitting: Skilled Nursing Facility1

## 2021-05-06 ENCOUNTER — Ambulatory Visit: Payer: Medicaid Other | Admitting: Skilled Nursing Facility1

## 2021-05-21 ENCOUNTER — Ambulatory Visit (INDEPENDENT_AMBULATORY_CARE_PROVIDER_SITE_OTHER): Payer: Medicaid Other | Admitting: Plastic Surgery

## 2021-05-21 ENCOUNTER — Other Ambulatory Visit: Payer: Self-pay

## 2021-05-21 ENCOUNTER — Encounter: Payer: Self-pay | Admitting: Plastic Surgery

## 2021-05-21 DIAGNOSIS — G8929 Other chronic pain: Secondary | ICD-10-CM

## 2021-05-21 DIAGNOSIS — M546 Pain in thoracic spine: Secondary | ICD-10-CM

## 2021-05-21 DIAGNOSIS — Z9889 Other specified postprocedural states: Secondary | ICD-10-CM | POA: Insufficient documentation

## 2021-05-21 DIAGNOSIS — M793 Panniculitis, unspecified: Secondary | ICD-10-CM

## 2021-05-21 DIAGNOSIS — M549 Dorsalgia, unspecified: Secondary | ICD-10-CM | POA: Insufficient documentation

## 2021-05-21 DIAGNOSIS — N62 Hypertrophy of breast: Secondary | ICD-10-CM | POA: Diagnosis not present

## 2021-05-21 NOTE — Progress Notes (Signed)
Patient ID: Dawn Thomas, female    DOB: Nov 23, 1975, 45 y.o.   MRN: [id number]   Chief Complaint  Patient presents with   Advice Only   Breast Problem    The patient is a 45 year old female here for evaluation of her breasts and her pannus.  She underwent a gastric sleeve surgery in 2018.  She went from 410 pounds to 282 pounds.  Her weight has been stable for the past year.  She is 5 feet 6 inches tall.  She feels that this is her final weight.  She complains of yeast infections in her folds both in her abdomen and in her breast area.  Her skin breaks down.  It is worse in the summer.  She is not a smoker and does not have diabetes.  She is being evaluated for knee surgery because of the weight and stress on her knees.  Breast: She has extremely large breasts causing symptoms that include the following: Back pain in the upper and lower back, including neck pain. She pulls or pins her bra straps to provide better lift and relief of the pressure and pain. She notices relief by holding her breast up manually.  Her shoulder straps cause grooves and pain and pressure that requires padding for relief. Pain medication is sometimes required with motrin and tylenol.  Activities that are hindered by enlarged breasts include: exercise and running.  She has tried supportive clothing as well as fitted bras without improvement.  Her breasts are extremely large and fairly symmetric.  She has hyperpigmentation of the inframammary area on both sides.  The sternal to nipple distance on the right is 41 cm and the left is 41 cm.  The IMF distance is 15 cm.  She is 5 feet 6 inches tall and weighs 282 pounds.  The BMI = 45 kg/m.  Preoperative bra size = unknown but extremely large.  The estimated excess breast tissue to be removed at the time of surgery = 1000 grams on the left and 1000 grams on the right.  Mammogram history: 2022 at [facility]. Tobacco use:  none.   The patient expresses the desire to pursue  surgical intervention.  Her priority is the pannus due to her pain and knee pain    Review of Systems  Constitutional:  Positive for activity change.  HENT: Negative.    Eyes: Negative.   Respiratory:  Negative for chest tightness and shortness of breath.   Gastrointestinal: Negative.   Endocrine: Negative.   Genitourinary: Negative.   Musculoskeletal:  Positive for back pain and neck pain.  Skin:  Positive for rash.  Neurological: Negative.   Hematological: Negative.   Psychiatric/Behavioral: Negative.     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   Renal disorder    stage 3- Dr [facility] - Dr Olivia Mackie   Renal insufficiency    Sleep apnea    mild    Tendonitis    Thyroid disease     Past Surgical History:  Procedure Laterality Date   CESAREAN SECTION     CHOLECYSTECTOMY     DILATION AND CURETTAGE OF UTERUS     LAPAROSCOPIC GASTRIC SLEEVE RESECTION N/A 05/30/2017   Procedure: LAPAROSCOPIC GASTRIC SLEEVE RESECTION WITH UPPPER ENDO AND HIATAL HERNIA REPAIR;  Surgeon: Kieth Brightly,  Arta Bruce, MD;  Location: [facility];  Service: General;  Laterality: N/A;      Current Outpatient Medications:    acetaminophen (TYLENOL) 500 MG tablet, Take 1,000 mg by mouth every 6 (six) hours as needed for mild pain., Disp: , Rfl:    albuterol (PROVENTIL HFA;VENTOLIN HFA) 108 (90 BASE) MCG/ACT inhaler, Inhale 2 puffs into the lungs every 6 (six) hours as needed for wheezing or shortness of breath. , Disp: , Rfl:    Ergocalciferol 50 MCG (2000 UT) TABS, Take 2,000 Units by mouth daily., Disp: , Rfl:    hydrALAZINE (APRESOLINE) 50 MG tablet, Take 1 tablet (50 mg total) by mouth every 8 (eight) hours., Disp: 270 tablet, Rfl: 3   levothyroxine (SYNTHROID, LEVOTHROID) 75 MCG tablet, Take 75 mcg by mouth daily. , Disp: , Rfl:    NIFEdipine (ADALAT CC)  60 MG 24 hr tablet, Take 60 mg by mouth 2 (two) times daily., Disp: , Rfl:    pantoprazole (PROTONIX) 40 MG tablet, Take 40 mg by mouth daily., Disp: , Rfl:    Prenatal Vit-Fe Fumarate-FA (PRENATAL MULTIVITAMIN) TABS tablet, Take 1 tablet by mouth daily at 12 noon., Disp: , Rfl:    sertraline (ZOLOFT) 50 MG tablet, Take 50 mg by mouth daily. (Patient not taking: Reported on 05/21/2021), Disp: , Rfl:    Objective:   Vitals:   05/21/21 1159  BP: (!) 198/96  Pulse: 73  SpO2: 100%    Physical Exam Vitals and nursing note reviewed.  Constitutional:      Appearance: Normal appearance.  HENT:     Head: Normocephalic and atraumatic.  Cardiovascular:     Rate and Rhythm: Normal rate.     Pulses: Normal pulses.  Pulmonary:     Effort: Pulmonary effort is normal. No respiratory distress.  Abdominal:     General: There is no distension.     Palpations: Abdomen is soft.     Tenderness: There is no abdominal tenderness.  Skin:    General: Skin is warm.     Coloration: Skin is not jaundiced.     Findings: No bruising.  Neurological:     Mental Status: She is alert and oriented to person, place, and time.  Psychiatric:        Mood and Affect: Mood normal.        Behavior: Behavior normal.        Thought Content: Thought content normal.    Assessment & Plan:  Morbid obesity ([place])  Panniculitis  Symptomatic mammary hypertrophy  Chronic bilateral thoracic back pain  The procedure the patient selected and that was best for the patient was discussed. The risk were discussed and include but not limited to the following:  fluid accumulation, firmness of the abdomen, skin loss, fat necrosis, bleeding, infection and healing delay.  There are risks of anesthesia and injury to nerves or blood vessels.  Allergic reaction to tape, suture and skin glue are possible.  There will be swelling.  Any of these can lead to the need for revisional surgery.      Total time: 40 minutes. This includes  time spent with the patient during the visit as well as time spent before and after the visit reviewing the chart, documenting the encounter and ordering pertinent studies. and literature emailed to the patient.   Physical therapy:  ordered Mammogram:  not needed  The patient is a very good candidate for panniculectomy.  We discussed the risks and complications.  Pictures were  obtained of the patient and placed in the chart with the patient's or guardian's permission.    [place], DO

## 2021-06-10 ENCOUNTER — Ambulatory Visit: Payer: Medicaid Other | Attending: Plastic Surgery

## 2021-06-10 ENCOUNTER — Other Ambulatory Visit: Payer: Self-pay

## 2021-06-10 DIAGNOSIS — M545 Low back pain, unspecified: Secondary | ICD-10-CM | POA: Diagnosis present

## 2021-06-10 DIAGNOSIS — M542 Cervicalgia: Secondary | ICD-10-CM | POA: Diagnosis present

## 2021-06-10 DIAGNOSIS — N62 Hypertrophy of breast: Secondary | ICD-10-CM | POA: Diagnosis not present

## 2021-06-10 DIAGNOSIS — G8929 Other chronic pain: Secondary | ICD-10-CM | POA: Diagnosis present

## 2021-06-10 DIAGNOSIS — M25562 Pain in left knee: Secondary | ICD-10-CM | POA: Insufficient documentation

## 2021-06-10 DIAGNOSIS — M793 Panniculitis, unspecified: Secondary | ICD-10-CM | POA: Insufficient documentation

## 2021-06-10 DIAGNOSIS — M25561 Pain in right knee: Secondary | ICD-10-CM | POA: Diagnosis present

## 2021-06-10 DIAGNOSIS — M546 Pain in thoracic spine: Secondary | ICD-10-CM | POA: Insufficient documentation

## 2021-06-10 NOTE — Patient Instructions (Signed)
Access Code: [email or web address] URL: [email or web address] Date: 06/10/2021 Prepared by: Kathreen Cornfield  Exercises Seated Thoracic Extension with Pectoralis Stretch - 2-3 x daily - 7 x weekly - 1-2 sets - 10 reps Seated Scapular Retraction - 2 x daily - 7 x weekly - 1-2 sets - 10-15 reps - 3 seconds hold Corner Pec Minor Stretch - 2-3 x daily - 7 x weekly - 2-3 sets - 20-30 seconds hold

## 2021-06-11 NOTE — Therapy (Addendum)
[place] [facility], [place], [address] Phone: [phone]   Fax:  [phone]  Physical Therapy Evaluation  Patient Details  Name: Dawn Thomas MRN: [id number] Date of Birth: May 10, 1976 Referring Provider (PT): Audelia Hives, DO   Encounter Date: 06/10/2021   PT End of Session - 06/11/21 1035     Visit Number 1    Number of Visits 6    Date for PT Re-Evaluation 07/31/21    Authorization Type [place] Medicaid [employer]    PT Start Time 1512   pt arrived late   PT Stop Time 1545    PT Time Calculation (min) 33 min    Activity Tolerance Patient tolerated treatment well    Behavior During Therapy 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    Subjective Assessment - 06/10/21 1518     Subjective Pt reports low back pain is the worst. She also has upper back pain, shoulder pain, and knee pain, requiring cortisone shot recently. She can't squat or even sneeze without her back hurting. She has to bend over and brace for that to prevent pain. She has a 45-year-old and has recently returned to pre-pregnancy weight. To pick her daughter up off the floor, she "will  literally get stuck" because pain. Pt continues to try to lose weight, verbalizing it's very difficult.    Pertinent History Gastric sleeve surgery May 30, 2017    Limitations Lifting;House hold activities    Patient Stated Goals wants to make the pain better, be able to squat to pick things up like her daughter without getting stuck    Currently in Pain? 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 3    Has the patient had a decrease in activity level because of a fear of falling?  No    Is the patient reluctant to leave their home because of a fear of falling?  No      Home Ec[employer] residence    Living Arrangements Children;Other (Comment)   god sister and sister staying with her temporarily   Type of Home House    Home Access Stairs to enter    Entrance Stairs-Number of Steps 1    Home Layout One  level      Prior Function   Level of Independence Independent    Vocation Part time employment   30 hours   Vocation Requirements PCA, cleaning, laundry, cooking, caregive    Leisure [employer] and cupcakes      ROM / Strength   AROM / PROM / Strength AROM      AROM   Overall AROM Comments L>R pain    AROM Assessment Site Lumbar    Lumbar Flexion WFL    Lumbar Extension 10 degrees    Lumbar - Right Side Bend fingertips to top of patella   L side LBP   Lumbar - Left Side Bend fingertips to joint line    Lumbar - Right Rotation 75% rotation   L side LBP   Lumbar - Left Rotation 75% rotation                        Objective measurements completed on examination: See above findings.                PT Education - 06/10/21 1535     Education Details Diagnosis, Prognosis, HEP, POC    Person(s) Educated Patient    Methods Explanation;Demonstration;Tactile cues;Verbal cues;Handout    Comprehension Verbalized understanding;Returned demonstration;Verbal cues required;Tactile cues required              PT Short Term Goals - 06/11/21 1051       PT SHORT TERM GOAL #1   Title Pt will be I and compliant with initial HEP.    Baseline provided at eval    Time 3    Period Weeks    Status New    Target Date 07/01/21      PT SHORT TERM GOAL #2   Title Pt will report decrease in pain at worst to 10/10.    Baseline 15/10    Time 3    Period Weeks    Status New    Target Date 07/01/21               PT Long Term Goals - 06/11/21 1102       PT LONG TERM GOAL #1   Title Pt will be independent with advanced HEP for continued improved posture and management of symptoms.    Time 8    Period Weeks    Status New  Target Date 07/31/21      PT LONG TERM GOAL #2   Title Pt will perform job duties with </= 5/10 pain at worst for symptom management.    Baseline 15/10    Time 8    Period Weeks    Status New    Target Date 07/31/21      PT  LONG TERM GOAL #3   Title Pt will demonstrate proper truncal and neck posture for at least 5 minutes at a time during session, indicating improvement postural endurance.    Time 8    Period Weeks    Status New    Target Date 07/31/21      PT LONG TERM GOAL #4   Title Pt will report no back pain with sneezing, indicating improve postural and core strength.    Baseline pt bends over and braces to sneeze    Time 8    Period Weeks    Status New    Target Date 07/31/21                    Plan - 06/11/21 1036     Clinical Impression Statement Pt is a 45 yo female who presents to OP PT with mammary hypertrophy and panniculitis causing chronic back and neck pain. Pt additionally c/o B knee pain. PMH includes gastric sleeve surgery 05/30/2017 with greater than 100# weight loss. 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 Aggravating factors include: bending, lifting, sneezing, prolonged lying. Eases: walking, standing, squatting. Pt demonstrates impairments in spinal AROM --> limited and painful in L>R side, LE strength, posture, and pain. Educated pt on diagnosis, prognosis, HEP, and POC verbalizing understanding and consent to tx. She would benefit from skilled PT 1x/week for 6-8 weeks to address impairments.    Personal Factors and Comorbidities Comorbidity 3+;Time since onset of injury/illness/exacerbation;Past/Current Experience;Fitness;Profession    Comorbidities asthma, HTN, OSA, thryoid disease, kidney disease    Examination-Activity Limitations Bend;Lift;Caring for Others;Sleep    Examination-Participation Restrictions Occupation;Cleaning;Laundry    Stability/Clinical Decision Making Stable/Uncomplicated    Clinical Decision Making Low    Rehab Potential Good    PT Frequency 1x / week    PT Duration --   6-8 weeks   PT Treatment/Interventions ADLs/Self Care Home Management;Aquatic Therapy;Moist  Heat;Cryotherapy;Electrical Stimulation;Iontophoresis 4mg /ml Dexamethasone;Traction;Therapeutic activities;Functional mobility training;Therapeutic exercise;Balance training;Neuromuscular re-education;Patient/family education;Manual techniques;Joint Manipulations;Spinal Manipulations;Passive range of motion;Vasopneumatic Device;Dry needling;Taping    PT Next Visit Plan Assess HEP/update PRN, postural alignment, pec stretching, periscapular strength, spinal mobility and stability    PT Home Exercise Plan [email or web address]    Consulted and Agree with Plan of Care Patient             Patient will benefit from skilled therapeutic intervention in order to improve the following deficits and impairments:  Pain, Postural dysfunction, Increased fascial restricitons, Decreased activity tolerance, Improper body mechanics, Obesity, Impaired perceived functional ability, Decreased range of motion  Visit Diagnosis: Chronic bilateral low back pain without sciatica  Cervicalgia  Chronic pain of right knee  Chronic pain of left knee     Problem List Patient Active Problem List   Diagnosis Date Noted   Panniculitis 05/21/2021   Symptomatic mammary hypertrophy 05/21/2021   Back pain 05/21/2021   Essential hypertension 08/20/2020   Chronic kidney disease, stage 3b ([place]) 08/20/2020   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 [place], [place], [address] Phone: [phone]   Fax:  [phone]  Name: Dawn Thomas MRN: [id number] Date of Birth: July 26, 1976  [place]    Choose one: Rehabilitative  Standardized Assessment or Functional Outcome Tool: See Pain Assessment and N/A  Score or Percent Disability: N/A  Body Parts Treated (Select each separately):  Cervicothoracic. Overall deficits/functional limitations for body part selected: moderate Lumbopelvic. Overall deficits/functional limitations for body part selected: severe Knee. Overall deficits/functional limitations for body part selected: moderate

## 2021-06-21 ENCOUNTER — Encounter: Payer: Medicaid Other | Attending: General Surgery | Admitting: Skilled Nursing Facility1

## 2021-06-22 ENCOUNTER — Encounter: Payer: Medicaid Other | Admitting: Physical Therapy

## 2021-06-22 ENCOUNTER — Encounter: Payer: Self-pay | Admitting: Plastic Surgery

## 2021-06-30 ENCOUNTER — Ambulatory Visit: Payer: Medicaid Other

## 2021-06-30 ENCOUNTER — Other Ambulatory Visit: Payer: Self-pay

## 2021-06-30 DIAGNOSIS — M542 Cervicalgia: Secondary | ICD-10-CM

## 2021-06-30 DIAGNOSIS — G8929 Other chronic pain: Secondary | ICD-10-CM

## 2021-06-30 DIAGNOSIS — M545 Low back pain, unspecified: Secondary | ICD-10-CM | POA: Diagnosis not present

## 2021-06-30 DIAGNOSIS — M25562 Pain in left knee: Secondary | ICD-10-CM

## 2021-06-30 NOTE — Therapy (Signed)
[place], [place], [address] Phone: [phone]   Fax:  [phone]  Physical Therapy Treatment  Patient Details  Name: Dawn Thomas MRN: [id number] Date of Birth: [birth year] Referring Provider (PT): Audelia Hives, DO   Encounter Date: 06/30/2021   PT End of Session - 06/30/21 1709     Visit Number 2    Number of Visits 6    Date for PT Re-Evaluation 07/31/21    Authorization Type [place] Medicaid Well[employer]    PT Start Time 1600    PT Stop Time 1645    PT Time Calculation (min) 45 min    Activity Tolerance Patient tolerated treatment well    Behavior During Therapy [facility] for tasks assessed/performed             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   Subjective Assessment - 06/30/21 1607     Subjective Pt reports she has been doing her HEP but forgot about the thoracic back ext ex.    Patient Stated Goals wants to make the pain better, be able to squat to pick things up like her daughter without getting stuck    Currently in Pain? 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             [facility] Adult PT Treatment/Exercise:  Therapeutic Exercise: -  60d doorway pectoral stretch 2x30 sec - Thoracic ext c pectoral stretch, 2x c report of mid back pain and the ex was stopped and placed on hold - Scap retraction x10, 3 sec - Cervical retraction x10, 3 sec - Shoulder ext x15, BTB - Shoulder row x15, BTB  Manual Therapy: NA  Neuromuscular re-ed: NA  Therapeutic Activity: NA                            PT Education - 06/30/21 1708     Education Details Updated HEP    Person(s) Educated Patient    Methods Explanation;Demonstration;Tactile cues;Verbal cues    Comprehension Verbalized understanding;Returned demonstration;Verbal cues required;Tactile cues required;Need further instruction  PT Short Term Goals - 06/11/21 1051       PT SHORT TERM GOAL #1   Title Pt will be I and compliant with initial HEP.    Baseline provided at eval    Time 3    Period Weeks    Status New    Target Date 07/01/21      PT SHORT TERM GOAL #2   Title Pt will report decrease in pain at worst to 10/10.    Baseline 15/10    Time 3    Period Weeks    Status New    Target Date 07/01/21               PT Long Term Goals - 06/11/21 1102       PT LONG TERM GOAL #1   Title Pt will be independent with advanced HEP for continued improved posture and management of symptoms.    Time 8    Period Weeks    Status New    Target Date 07/31/21      PT LONG TERM GOAL #2   Title Pt will perform job duties with </= 5/10 pain at worst for symptom management.    Baseline 15/10    Time 8    Period Weeks    Status New    Target Date 07/31/21      PT  LONG TERM GOAL #3   Title Pt will demonstrate proper truncal and neck posture for at least 5 minutes at a time during session, indicating improvement postural endurance.    Time 8    Period Weeks    Status New    Target Date 07/31/21      PT LONG TERM GOAL #4   Title Pt will report no back pain with sneezing, indicating improve postural and core strength.    Baseline pt bends over and braces to sneeze    Time 8    Period Weeks    Status New    Target Date 07/31/21                   Plan - 06/30/21 1710     Clinical Impression Statement PT was completed for anterior chest/pectoral and upper cervical stretching, and strengthening of the core and posterior chain to address choronic back pain associated with mammory hypertrophy and pannus. With thoracic extension pt reported mid back pain, and this ex was stopped and placed on hold. Ther ex were added to the pt's HEP and a written program provided. Pt tolerated today's session without adverse effects.    Personal Factors and Comorbidities Comorbidity 3+;Time since onset of injury/illness/exacerbation;Past/Current Experience;Fitness;Profession    Comorbidities asthma, HTN, OSA, thryoid disease, kidney disease    Examination-Activity Limitations Bend;Lift;Caring for Others;Sleep    Examination-Participation Restrictions Occupation;Cleaning;Laundry    Stability/Clinical Decision Making Stable/Uncomplicated    Clinical Decision Making Low    Rehab Potential Good    PT Frequency 1x / week    PT Duration --   6-8 weeks   PT Treatment/Interventions ADLs/Self Care Home Management;Aquatic Therapy;Moist Heat;Cryotherapy;Electrical Stimulation;Iontophoresis 4mg /ml Dexamethasone;Traction;Therapeutic activities;Functional mobility training;Therapeutic exercise;Balance training;Neuromuscular re-education;Patient/family education;Manual techniques;Joint Manipulations;Spinal Manipulations;Passive range of motion;Vasopneumatic Device;Dry  needling;Taping    PT Next Visit Plan Assess HEP/update PRN, postural alignment, pec stretching, periscapular strength, spinal mobility and stability    PT Home Exercise Plan [id number]    Consulted and Agree with Plan of Care Patient             Patient  will benefit from skilled therapeutic intervention in order to improve the following deficits and impairments:  Pain, Postural dysfunction, Increased fascial restricitons, Decreased activity tolerance, Improper body mechanics, Obesity, Impaired perceived functional ability, Decreased range of motion  Visit Diagnosis: Chronic bilateral low back pain without sciatica  Cervicalgia  Chronic pain of right knee  Chronic pain of left knee     Problem List Patient Active Problem List   Diagnosis Date Noted   Panniculitis 05/21/2021   Symptomatic mammary hypertrophy 05/21/2021   Back pain 05/21/2021   Essential hypertension 08/20/2020   Chronic kidney disease, stage 3b ([place]) 08/20/2020   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 [place], [place], [address] Phone: [phone]   Fax:  [phone]  Name: Dawn Thomas MRN: [id number] Date of Birth: 1976/03/01

## 2021-07-07 ENCOUNTER — Ambulatory Visit: Payer: Medicaid Other

## 2021-07-14 ENCOUNTER — Ambulatory Visit: Payer: Medicaid Other | Attending: Plastic Surgery

## 2021-07-14 ENCOUNTER — Telehealth: Payer: Self-pay

## 2021-07-14 DIAGNOSIS — M542 Cervicalgia: Secondary | ICD-10-CM | POA: Insufficient documentation

## 2021-07-14 DIAGNOSIS — M25561 Pain in right knee: Secondary | ICD-10-CM | POA: Insufficient documentation

## 2021-07-14 DIAGNOSIS — M545 Low back pain, unspecified: Secondary | ICD-10-CM | POA: Insufficient documentation

## 2021-07-14 DIAGNOSIS — G8929 Other chronic pain: Secondary | ICD-10-CM | POA: Insufficient documentation

## 2021-07-14 DIAGNOSIS — M25562 Pain in left knee: Secondary | ICD-10-CM | POA: Insufficient documentation

## 2021-07-14 NOTE — Telephone Encounter (Signed)
LVM re: Her 1st no show visit, attendance policy, and her upcoming appt.

## 2021-07-21 ENCOUNTER — Ambulatory Visit: Payer: Medicaid Other

## 2021-07-21 ENCOUNTER — Other Ambulatory Visit: Payer: Self-pay

## 2021-07-21 DIAGNOSIS — M25561 Pain in right knee: Secondary | ICD-10-CM

## 2021-07-21 DIAGNOSIS — M25562 Pain in left knee: Secondary | ICD-10-CM

## 2021-07-21 DIAGNOSIS — M542 Cervicalgia: Secondary | ICD-10-CM | POA: Diagnosis present

## 2021-07-21 DIAGNOSIS — G8929 Other chronic pain: Secondary | ICD-10-CM | POA: Diagnosis present

## 2021-07-21 DIAGNOSIS — M545 Low back pain, unspecified: Secondary | ICD-10-CM | POA: Diagnosis not present

## 2021-07-21 NOTE — Therapy (Signed)
[place], [place], [address] Phone: [phone]   Fax:  [phone]  Physical Therapy Treatment  Patient Details  Name: Dawn Thomas MRN: [id number] Date of Birth: [birth year] Referring Provider (PT): Audelia Hives, DO   Encounter Date: 07/21/2021   PT End of Session - 07/21/21 1600     Visit Number 3    Number of Visits 6    Date for PT Re-Evaluation 07/31/21    Authorization Type [place] Medicaid [employer]    PT Start Time 1749    PT Stop Time 4496    PT Time Calculation (min) 42 min    Activity Tolerance Patient tolerated treatment well    Behavior During Therapy [facility] for tasks assessed/performed             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   Subjective Assessment - 07/21/21 1554     Subjective Pt reports she is doing better. She is not getting stuck with returning from forward bending.    Patient Stated Goals wants to make the pain better, be able to squat to pick things up like her daughter without getting stuck    Currently in Pain? Yes    Pain Score 1     Pain Location Back    Pain Orientation Right;Left;Lower;Mid    Pain Descriptors / Indicators Aching    Pain Type  Chronic pain    Pain Onset More than a month ago    Pain Frequency Intermittent    Aggravating Factors  bending, lifting, sneezing, prolonged lying, stairs    Pain Relieving Factors Walking, standing, squatting                           [facility] Adult PT Treatment/Exercise:   Therapeutic Exercise: -  60d doorway pectoral stretch 2x30 sec - Thoracic ext c pectoral stretch, 2x c report of mid back pain and the ex was stopped and placed on hold - Cervical retraction x5, 3 sec - Thoracic ext c pec stretch x10 3" - Shoulder ext x15, BTB - Shoulder row x15, BTB - Shoulder bilat ER x15 GTB - Shoulder flexion to 90d 4# 2x10 - PPT x10 3" - PPT small range marching 2x5 ea LE - quadruped mule kicks 2x5 ea LE   Manual Therapy: NA   Neuromuscular re-ed: NA   Therapeutic Activity: - proper technique c sitting to/from supine on mat table, abdominal activivation and log rolling                  PT Short Term  Goals - 06/11/21 1051       PT SHORT TERM GOAL #1   Title Pt will be I and compliant with initial HEP.    Baseline provided at eval    Time 3    Period Weeks    Status New    Target Date 07/01/21      PT SHORT TERM GOAL #2   Title Pt will report decrease in pain at worst to 10/10.    Baseline 15/10    Time 3    Period Weeks    Status New    Target Date 07/01/21               PT Long Term Goals - 06/11/21 1102       PT LONG TERM GOAL #1   Title Pt will be independent with advanced HEP for continued improved posture and management of symptoms.    Time 8    Period Weeks    Status New    Target Date 07/31/21      PT LONG TERM GOAL #2   Title Pt will perform job duties with </= 5/10 pain at worst for symptom management.    Baseline 15/10    Time 8    Period Weeks    Status New    Target Date 07/31/21      PT LONG TERM GOAL #3   Title Pt will demonstrate proper truncal and neck posture for at least 5 minutes at a time during  session, indicating improvement postural endurance.    Time 8    Period Weeks    Status New    Target Date 07/31/21      PT LONG TERM GOAL #4   Title Pt will report no back pain with sneezing, indicating improve postural and core strength.    Baseline pt bends over and braces to sneeze    Time 8    Period Weeks    Status New    Target Date 07/31/21                   Plan - 07/21/21 1655     Clinical Impression Statement Pt reports she has been completing her HEP consistently and her back is feeling better. PT today continued to address posture and lumbopelvic flexibility and strength. Progressions were made regarding demand for strengthening. Education was provided for proper technique with sitting to/from supine. Pt reported less discomfort with technique. Pt will continue to benefit from skilled PT to optimize function with less pain.    Personal Factors and Comorbidities Comorbidity 3+;Time since onset of injury/illness/exacerbation;Past/Current Experience;Fitness;Profession    Comorbidities asthma, HTN, OSA, thryoid disease, kidney disease    Examination-Activity Limitations Bend;Lift;Caring for Others;Sleep    Examination-Participation Restrictions Occupation;Cleaning;Laundry    Stability/Clinical Decision Making Stable/Uncomplicated    Clinical Decision Making Low    Rehab Potential Good    PT Frequency 1x / week    PT Duration --   6-8 wks   PT Treatment/Interventions ADLs/Self Care Home Management;Aquatic Therapy;Moist Heat;Cryotherapy;Electrical Stimulation;Iontophoresis 4mg /ml Dexamethasone;Traction;Therapeutic activities;Functional mobility training;Therapeutic exercise;Balance training;Neuromuscular re-education;Patient/family education;Manual techniques;Joint Manipulations;Spinal Manipulations;Passive range of motion;Vasopneumatic Device;Dry needling;Taping    PT Next Visit Plan Assess HEP/update PRN, postural alignment, pec stretching, periscapular strength, spinal  mobility and stability    PT Home Exercise Plan [id number]    Consulted and Agree with Plan of Care Patient             Patient will benefit from skilled therapeutic intervention  in order to improve the following deficits and impairments:  Pain, Postural dysfunction, Increased fascial restricitons, Decreased activity tolerance, Improper body mechanics, Obesity, Impaired perceived functional ability, Decreased range of motion  Visit Diagnosis: Chronic bilateral low back pain without sciatica  Cervicalgia  Chronic pain of right knee  Chronic pain of left knee     Problem List Patient Active Problem List   Diagnosis Date Noted   Panniculitis 05/21/2021   Symptomatic mammary hypertrophy 05/21/2021   Back pain 05/21/2021   Essential hypertension 08/20/2020   Chronic kidney disease, stage 3b ([place]) 08/20/2020   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

## 2021-07-28 ENCOUNTER — Ambulatory Visit: Payer: Medicaid Other

## 2021-07-28 ENCOUNTER — Other Ambulatory Visit: Payer: Self-pay

## 2021-07-28 DIAGNOSIS — G8929 Other chronic pain: Secondary | ICD-10-CM

## 2021-07-28 DIAGNOSIS — M545 Low back pain, unspecified: Secondary | ICD-10-CM | POA: Diagnosis not present

## 2021-07-28 DIAGNOSIS — M542 Cervicalgia: Secondary | ICD-10-CM

## 2021-07-28 NOTE — Therapy (Signed)
[place], [place], [address] Phone: [phone]   Fax:  [phone]  Physical Therapy Treatment/ERO  Patient Details  Name: Dawn Thomas MRN: [id number] Date of Birth: [birth year] Referring Provider (PT): Audelia Hives, DO   Encounter Date: 07/28/2021   PT End of Session - 07/28/21 1603     Visit Number 4    Number of Visits 6    Date for PT Re-Evaluation 08/21/21    Authorization Type [place] Medicaid [employer] 06/18/21 - 08/17/21    Authorization - Visit Number 3    Authorization - Number of Visits 10    PT Start Time 7412    PT Stop Time 8786    PT Time Calculation (min) 40 min    Activity Tolerance Patient tolerated treatment well    Behavior During Therapy [facility] for tasks assessed/performed             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   Subjective Assessment - 07/29/21 0530     Subjective Pt reports she is continuing to do better wih less pain. 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    Patient Stated Goals wants to make the pain better, be able to squat to pick things up like her daughter without getting stuck     Currently in Pain? Yes    Pain Score 2     Pain Location Back    Pain Orientation Right;Left;Lower;Mid    Pain Descriptors / Indicators Aching    Pain Type Chronic pain    Pain Onset More than a month ago    Pain Frequency Intermittent    Aggravating Factors  bending, lifting, sneezing, prolonged lying, stairs, getting up from the floor    Pain Relieving Factors Walking, standing, squatting                             Therapeutic Exercise: -  60d doorway pectoral stretch 2x30 sec - Cervical retraction x5, 3 sec - Thoracic ext c pec stretch x10 3" - PPT x10 3" - PPT small range marching 2x10 ea LE - STS x10, with dowel for tactile cueing - Hinged hip dead lift from 10" height to waist level, x10, 25# - Hinged hip dead lift from 10" height then a 90d turn with feet to placement on a mat table, x10, 25#  Manual Therapy: NA   Neuromuscular re-ed: NA   Therapeutic Activity: - Review of proper technique c sitting to/from supine on mat  table, abdominal activation with log rolling and sit to/from supine  Not completed this PT session - Shoulder ext x15, BTB - Shoulder row x15, BTB - Shoulder bilat ER x15 GTB - Shoulder flexion to 90d 4# 2x10 - quadruped mule kicks 2x5 ea LE        PT Education - 07/29/21 0535     Education Details Proper technique for hinged hip lifting    Person(s) Educated Patient    Methods Explanation;Demonstration;Tactile cues;Verbal cues    Comprehension Verbalized understanding;Returned demonstration;Verbal cues required;Tactile cues required              PT Short Term Goals - 07/28/21 1601       PT SHORT TERM GOAL #1   Title Pt will be I and compliant with initial HEP.    Baseline provided at eval    Status Achieved    Target Date 07/28/21      PT SHORT TERM GOAL #2   Title Pt will report decrease in pain at worst to 10/10. 12/28/2: 5/10 or less    Period Weeks    Status Achieved    Target Date 07/28/21                PT Long Term Goals - 07/28/21 1605       PT LONG TERM GOAL #1   Title Pt will be independent with advanced HEP for continued improved posture and management of symptoms. 12.28/22: Pt is Ind in her initial PT program    Status On-going    Target Date 08/21/21      PT LONG TERM GOAL #2   Title Pt will perform job duties with </= 5/10 pain at worst for symptom management. 07/28/21: 5/10 or less currently with work related activities.    Target Date 08/21/21      PT LONG TERM GOAL #3   Title Pt will demonstrate proper truncal and neck posture for at least 5 minutes at a time during session, indicating improvement postural endurance. 07/28/21: Pt is able to obtain proper truncal and neck sitting posture.    Status On-going    Target Date 08/21/21      PT LONG TERM GOAL #4   Title Pt will report no back pain with sneezing, indicating improve postural and core strength.    Baseline pt bends over and braces to sneeze    Status On-going    Target Date 08/21/21                   Plan - 07/29/21 0536     Clinical Impression Statement Pt continues to progress properly regarding decrease of pain and improved tolerance to activity. PT today focused on proper technique for hinged hip dead lifting to assist with pt's duties for pt care and to increase trunk strength for completing these duties. Pt was able to complete lifting with proper technique. Pt reported appropriate level of low back soreness/fatigue associated with the completion of activity without adverse effects.    Personal Factors and Comorbidities Comorbidity 3+;Time since onset of injury/illness/exacerbation;Past/Current Experience;Fitness;Profession    Comorbidities asthma, HTN, OSA, thryoid disease, kidney disease    Examination-Activity Limitations Bend;Lift;Caring for Others;Sleep    Examination-Participation Restrictions Occupation;Cleaning;Laundry    Stability/Clinical Decision Making Stable/Uncomplicated     Clinical Decision Making Low    Rehab Potential Good    PT Frequency 1x / week    PT Duration 2 weeks    PT Treatment/Interventions ADLs/Self Care Home Management;Aquatic Therapy;Moist  Heat;Cryotherapy;Electrical Stimulation;Iontophoresis 4mg /ml Dexamethasone;Traction;Therapeutic activities;Functional mobility training;Therapeutic exercise;Balance training;Neuromuscular re-education;Patient/family education;Manual techniques;Joint Manipulations;Spinal Manipulations;Passive range of motion;Vasopneumatic Device;Dry needling;Taping    PT Next Visit Plan Assess HEP/update PRN, postural alignment, pec stretching, periscapular strength, spinal mobility and stability, lifting techniques    PT Home Exercise Plan [id number]    Consulted and Agree with Plan of Care Patient             Patient will benefit from skilled therapeutic intervention in order to improve the following deficits and impairments:  Pain, Postural dysfunction, Increased fascial restricitons, Decreased activity tolerance, Improper body mechanics, Obesity, Impaired perceived functional ability, Decreased range of motion  Visit Diagnosis: Chronic bilateral low back pain without sciatica  Cervicalgia     Problem List Patient Active Problem List   Diagnosis Date Noted   Panniculitis 05/21/2021   Symptomatic mammary hypertrophy 05/21/2021   Back pain 05/21/2021   Essential hypertension 08/20/2020   Chronic kidney disease, stage 3b ([place]) 08/20/2020   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

## 2021-08-04 ENCOUNTER — Ambulatory Visit: Payer: Medicaid Other | Admitting: Skilled Nursing Facility1

## 2021-08-05 ENCOUNTER — Ambulatory Visit: Payer: Medicaid Other | Attending: Neurological Surgery

## 2021-08-05 ENCOUNTER — Other Ambulatory Visit: Payer: Self-pay

## 2021-08-05 DIAGNOSIS — M25562 Pain in left knee: Secondary | ICD-10-CM | POA: Insufficient documentation

## 2021-08-05 DIAGNOSIS — M25561 Pain in right knee: Secondary | ICD-10-CM | POA: Diagnosis present

## 2021-08-05 DIAGNOSIS — M542 Cervicalgia: Secondary | ICD-10-CM | POA: Insufficient documentation

## 2021-08-05 DIAGNOSIS — M545 Low back pain, unspecified: Secondary | ICD-10-CM | POA: Diagnosis not present

## 2021-08-05 DIAGNOSIS — G8929 Other chronic pain: Secondary | ICD-10-CM | POA: Insufficient documentation

## 2021-08-05 NOTE — Therapy (Signed)
[place], [place], [address] Phone: [phone]   Fax:  [phone]  Physical Therapy Treatment  Patient Details  Name: Dawn Thomas MRN: [id number] Date of Birth: June 19, 1976 Referring Provider (PT): Audelia Hives, DO   Encounter Date: 08/05/2021   PT End of Session - 08/05/21 1638     Visit Number 5    Number of Visits 6    Date for PT Re-Evaluation 08/21/21    Authorization Type [place] Medicaid [employer] 06/18/21 - 08/17/21    Authorization - Visit Number 4    Authorization - Number of Visits 10    PT Start Time 7026    PT Stop Time 1720    PT Time Calculation (min) 44 min    Activity Tolerance Patient tolerated treatment well    Behavior During Therapy [facility] for tasks assessed/performed             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   Subjective Assessment - 08/05/21 1641     Subjective Pt reports she is continuing to get better with her only getting stuck one time since the last. Pt notes engaging her core with lifting, sit t/f standing and sit t/f supine is helpful with minimizing or eliminating her LBP with these activities.    Patient Stated Goals wants to make the pain better,  be able to squat to pick things up like her daughter without getting stuck    Currently in Pain? Yes    Pain Score 1     Pain Location Back    Pain Orientation Right;Left;Lower    Pain Descriptors / Indicators Aching    Pain Type Chronic pain    Pain Onset More than a month ago    Pain Frequency Intermittent    Aggravating Factors  bending, lifting, sneezing, prolonged lying, stairs, getting up from the floor    Pain Relieving Factors Walking, standing, squatting                                 Therapeutic Exercise: -  60d doorway pectoral stretch 2x30 sec - Cervical retraction x5, 3 sec - Thoracic ext c pec stretch x10 3" - PPT x10 3" - PPT small range marching 2x10 ea LE - STS x10, with dowel for tactile cueing - STS x10   - Hinged hip squat lift c dowel for tactile cueing - Hinged hip squat lift from 10" height to waist level, x10, 25# - Hinged hip squat lift from 10" height then a 90d turn with  feet to placement on a mat table, x10, 25# - Paloff press, 2x10, BTB, each   Manual Therapy: NA   Neuromuscular re-ed: NA   Therapeutic Activity: - Review of proper technique c sitting to/from supine on mat table, abdominal activation with log rolling and sit to/from supine   Not completed this PT session - Shoulder ext x15, BTB - Shoulder row x15, BTB - Shoulder bilat ER x15 GTB - Shoulder flexion to 90d 4# 2x10 - quadruped mule kicks 2x5 ea LE             PT Short Term Goals - 07/28/21 1601       PT SHORT TERM GOAL #1   Title Pt will be I and compliant with initial HEP.    Baseline provided at eval    Status Achieved    Target Date 07/28/21      PT SHORT TERM GOAL #2   Title Pt will report decrease in pain at worst to 10/10. 12/28/2: 5/10 or less    Period Weeks    Status Achieved    Target Date 07/28/21               PT Long Term Goals - 07/28/21 1605       PT LONG TERM GOAL #1   Title Pt will be independent with  advanced HEP for continued improved posture and management of symptoms. 12.28/22: Pt is Ind in her initial PT program    Status On-going    Target Date 08/21/21      PT LONG TERM GOAL #2   Title Pt will perform job duties with </= 5/10 pain at worst for symptom management. 07/28/21: 5/10 or less currently with work related activities.    Target Date 08/21/21      PT LONG TERM GOAL #3   Title Pt will demonstrate proper truncal and neck posture for at least 5 minutes at a time during session, indicating improvement postural endurance. 07/28/21: Pt is able to obtain proper truncal and neck sitting posture.    Status On-going    Target Date 08/21/21      PT LONG TERM GOAL #4   Title Pt will report no back pain with sneezing, indicating improve postural and core strength.    Baseline pt bends over and braces to sneeze    Status On-going    Target Date 08/21/21                   Plan - 08/05/21 1700     Clinical Impression Statement PT was completed for cervical and trunk strengthening to address pain, posture, and proper lifting technique. Pt is responding well with less reported pain, better management and prevention of pain, and pt demonstrating proper sitting posture and lifting techniques. Pt tolerated today's PT session wih greater physial demand without adverse effects.    Personal Factors and Comorbidities Comorbidity 3+;Time since onset of injury/illness/exacerbation;Past/Current Experience;Fitness;Profession    Comorbidities asthma, HTN, OSA, thryoid disease, kidney disease    Examination-Activity Limitations Bend;Lift;Caring for Others;Sleep    Examination-Participation Restrictions Occupation;Cleaning;Laundry    Stability/Clinical Decision Making Stable/Uncomplicated    Clinical Decision Making Low    Rehab Potential Good    PT Frequency 1x / week    PT Duration 2 weeks    PT Treatment/Interventions ADLs/Self Care Home Management;Aquatic Therapy;Moist  Heat;Cryotherapy;Electrical Stimulation;Iontophoresis 4mg /ml Dexamethasone;Traction;Therapeutic activities;Functional mobility training;Therapeutic exercise;Balance training;Neuromuscular re-education;Patient/family education;Manual techniques;Joint Manipulations;Spinal Manipulations;Passive range of motion;Vasopneumatic Device;Dry needling;Taping    PT Next Visit  Plan Assess HEP/update PRN, postural alignment, pec stretching, periscapular strength, spinal mobility and stability, lifting techniques. Possible DC- assess LTGs and establish final HEP.    PT Home Exercise Plan [id number]    Consulted and Agree with Plan of Care Patient             Patient will benefit from skilled therapeutic intervention in order to improve the following deficits and impairments:  Pain, Postural dysfunction, Increased fascial restricitons, Decreased activity tolerance, Improper body mechanics, Obesity, Impaired perceived functional ability, Decreased range of motion  Visit Diagnosis: Chronic bilateral low back pain without sciatica  Cervicalgia  Chronic pain of right knee  Chronic pain of left knee     Problem List Patient Active Problem List   Diagnosis Date Noted   Panniculitis 05/21/2021   Symptomatic mammary hypertrophy 05/21/2021   Back pain 05/21/2021   Essential hypertension 08/20/2020   Chronic kidney disease, stage 3b ([place]) 08/20/2020   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

## 2021-08-11 ENCOUNTER — Ambulatory Visit: Payer: Medicaid Other

## 2021-08-12 ENCOUNTER — Ambulatory Visit: Payer: Medicaid Other

## 2021-08-12 ENCOUNTER — Other Ambulatory Visit: Payer: Self-pay

## 2021-08-12 DIAGNOSIS — M545 Low back pain, unspecified: Secondary | ICD-10-CM

## 2021-08-12 DIAGNOSIS — M25561 Pain in right knee: Secondary | ICD-10-CM

## 2021-08-12 DIAGNOSIS — G8929 Other chronic pain: Secondary | ICD-10-CM

## 2021-08-12 DIAGNOSIS — M542 Cervicalgia: Secondary | ICD-10-CM

## 2021-08-12 NOTE — Therapy (Addendum)
[place], [place], [address] Phone: [phone]   Fax:  [phone]  Physical Therapy Treatment/Discharge  Patient Details  Name: Dawn Thomas MRN: [id number] Date of Birth: 08-10-1975 Referring Provider (PT): Audelia Hives, DO   Encounter Date: 08/12/2021   PT End of Session - 08/12/21 1600     Visit Number 6    Number of Visits 6    Date for PT Re-Evaluation 08/21/21    Authorization Type [place] [employer] 06/18/21 - 08/17/21    Authorization - Visit Number 6    Authorization - Number of Visits --    PT Start Time 7124    PT Stop Time 1630    PT Time Calculation (min) 35 min    Activity Tolerance Patient tolerated treatment well    Behavior During Therapy [facility] for tasks assessed/performed             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   Subjective Assessment - 08/12/21 1602     Subjective Pt reports the therapy has been helpful. Her her neck and low back are better and her low back does not lock up as much.    Patient Stated Goals wants to make the pain better, be able to squat to pick things up like her daughter without getting stuck    Pain Score 0-No pain   0-4 pain  range over the cours eof a week.   Pain Location Back    Pain Orientation Right;Left    Pain Descriptors / Indicators Aching    Pain Type Chronic pain    Pain Onset More than a month ago    Pain Frequency Intermittent    Aggravating Factors  bending, lifting, sneezing, prolonged lying, stairs, getting up from the floor    Pain Relieving Factors HEP                                Therapeutic Exercise: - 60d doorway pectoral stretch 2x30 sec - Cervical retraction x5, 3 sec - Thoracic ext c pec stretch x10 3" - PPT x10 3" - PPT small range marching 2x10 ea LE - STS x10   - Seated trunk flexion c physioball rollouts, forward and lateral - Shoulder ext x15, BTB - Shoulder row x15, BTB - Shoulder bilat ER x15 GTB - quadruped mule kicks 2x5 ea LE           PT Short Term Goals - 07/28/21 1601       PT SHORT TERM GOAL #1  Title Pt will be I and compliant with initial HEP.    Baseline provided at eval    Status Achieved    Target Date 07/28/21      PT SHORT TERM GOAL #2   Title Pt will report decrease in pain at worst to 10/10. 12/28/2: 5/10 or less    Period Weeks    Status Achieved    Target Date 07/28/21               PT Long Term Goals - 08/13/21 0841       PT LONG TERM GOAL #1   Title Pt will be independent with advanced HEP for continued improved posture and management of symptoms. 12.28/22: Pt is Ind in her initial PT program. 08/12/21: Ind in final HEP    Status Achieved    Target Date 08/12/21      PT LONG TERM GOAL #2   Title Pt will perform job duties with </= 5/10 pain at worst for symptom management. 07/28/21: 5/10 or less currently with work related activities.    Status Achieved    Target Date 08/12/21      PT LONG TERM GOAL #3   Status Achieved    Target Date 08/12/21      PT LONG TERM GOAL #4   Title Pt will report no back pain with sneezing, indicating improve postural and core strength.    Status Achieved                    Plan - 08/13/21 0840     Personal Factors and Comorbidities Comorbidity 3+;Time since onset of injury/illness/exacerbation;Past/Current Experience;Fitness;Profession    Comorbidities asthma, HTN, OSA, thryoid disease, kidney disease    Examination-Participation Restrictions Occupation;Cleaning;Laundry    Clinical Decision Making Low    Rehab Potential Good    PT Frequency 1x / week    PT Duration 2 weeks    PT Treatment/Interventions ADLs/Self Care Home Management;Aquatic Therapy;Moist Heat;Cryotherapy;Electrical Stimulation;Iontophoresis 59m/ml Dexamethasone;Traction;Therapeutic activities;Functional mobility training;Therapeutic exercise;Balance training;Neuromuscular re-education;Patient/family education;Manual techniques;Joint Manipulations;Spinal Manipulations;Passive range of motion;Vasopneumatic Device;Dry needling;Taping    PT Home Exercise Plan [id number]    Consulted and Agree with Plan of Care Patient             Patient will benefit from skilled therapeutic intervention in order to improve the following deficits and impairments:  Pain, Postural dysfunction, Increased fascial restricitons, Decreased activity tolerance, Improper body mechanics, Obesity, Impaired perceived functional ability, Decreased range of motion  Visit Diagnosis: Chronic bilateral low back pain without sciatica  Cervicalgia  Chronic pain of right knee  Chronic pain of left knee     Problem List Patient Active Problem List   Diagnosis Date Noted   Panniculitis 05/21/2021   Symptomatic mammary hypertrophy 05/21/2021   Back pain 05/21/2021   Essential hypertension 08/20/2020   Chronic kidney disease, stage 3b ([place] 08/20/2020   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 Patient goals were met. Patient is being discharged due to  being pleased with the current functional level.  Gar Ponto MS, PT 08/13/21 1:48 PM     [facility] [address] [place], [place], [address] Phone: [phone]   Fax:  [phone]  Name: Dawn Thomas MRN: [id number] Date of Birth: May 17, 1976

## 2021-08-31 ENCOUNTER — Other Ambulatory Visit: Payer: Self-pay

## 2021-08-31 ENCOUNTER — Ambulatory Visit (INDEPENDENT_AMBULATORY_CARE_PROVIDER_SITE_OTHER): Payer: Medicaid Other | Admitting: Surgical

## 2021-08-31 DIAGNOSIS — M793 Panniculitis, unspecified: Secondary | ICD-10-CM | POA: Diagnosis not present

## 2021-08-31 DIAGNOSIS — N62 Hypertrophy of breast: Secondary | ICD-10-CM

## 2021-08-31 DIAGNOSIS — M546 Pain in thoracic spine: Secondary | ICD-10-CM | POA: Diagnosis not present

## 2021-08-31 DIAGNOSIS — G8929 Other chronic pain: Secondary | ICD-10-CM

## 2021-08-31 NOTE — Progress Notes (Signed)
° °  Referring Provider [place], [place], [place] [address] [place],  [place] [address]   CC: No chief complaint on file.     Dawn Thomas is an 46 y.o. female.  HPI: Patient is a 46 y.o. 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  She reports that she is overall doing well.  She completed 6 weeks of physical therapy, reports this helped a little bit with her posture but she is still having daily back pain and neck pain.  She reports her back pain is her upper, mid and low back.  She is still interested in pursuing surgical intervention for bilateral breast reduction and panniculectomy.  She has some questions about the procedures, she has some questions about if the umbilicus is involved in a panniculectomy.  Review of Systems General: No infections  Physical Exam Vitals with BMI 05/21/2021 09/17/2020 08/22/2020  Height 5' 6.5" 5' 6.5" -  Weight 282 lbs 315 lbs -  BMI 63.87 56.43 -  Systolic 329 518 841  Diastolic 96 90 80  Pulse 73 70 86    General:  No acute distress,  Alert and oriented, Non-Toxic, Normal speech and affect Psych: Normal behavior and mood Respiratory: Unlabored  Assessment/Plan Patient is a 46 year old female, she underwent a gastric sleeve in 2018 and went from 410 pounds to 282 pounds.  She is 267.6 pounds today.  She is a good candidate for bilateral breast reduction and panniculectomy.  We discussed that panniculectomy does not involve transposing the umbilicus, but we would be happy to provide her a quote for this.  I discussed with the patient that I would need to consult with Dr. Marla Roe about this as well.  Patient is aware that she would need to have panniculectomy or breast reduction surgery separately and then have the subsequent surgery a few months after.  Discussed with patient we would submit to insurance for authorization, discussed approval could take up to 6  weeks.   Dawn Thomas 08/31/2021, 3:23 PM

## 2021-10-20 ENCOUNTER — Encounter: Payer: Self-pay | Admitting: Plastic Surgery

## 2021-11-03 ENCOUNTER — Telehealth: Payer: Self-pay | Admitting: Plastic Surgery

## 2021-11-03 NOTE — Telephone Encounter (Signed)
Returned patient's call to schedule. Since we are unable to schedule surgery before the trips she has planned, she wanted me to see if we can do surgery anytime after 7/25. I advised patient I would look into this and get back to her but that I am also waiting on confirmation for the transposing of her belly button. Patient is more than likely going to hold off on this until later.  ?

## 2021-11-10 ENCOUNTER — Telehealth: Payer: Self-pay | Admitting: Plastic Surgery

## 2021-11-10 NOTE — Telephone Encounter (Signed)
LVM to discuss surgery date 

## 2022-02-10 ENCOUNTER — Encounter: Payer: Self-pay | Admitting: Surgical

## 2022-02-10 ENCOUNTER — Ambulatory Visit (INDEPENDENT_AMBULATORY_CARE_PROVIDER_SITE_OTHER): Payer: Medicaid Other | Admitting: Surgical

## 2022-02-10 DIAGNOSIS — N62 Hypertrophy of breast: Secondary | ICD-10-CM

## 2022-02-10 DIAGNOSIS — M793 Panniculitis, unspecified: Secondary | ICD-10-CM

## 2022-02-10 DIAGNOSIS — G8929 Other chronic pain: Secondary | ICD-10-CM

## 2022-02-10 MED ORDER — ONDANSETRON HCL 4 MG PO TABS: 4.0000 mg | ORAL_TABLET | Freq: Three times a day (TID) | ORAL | 0 refills | Status: DC | PRN

## 2022-02-10 MED ORDER — DOXYCYCLINE HYCLATE 100 MG PO TABS: 100.0000 mg | ORAL_TABLET | Freq: Two times a day (BID) | ORAL | 0 refills | Status: AC

## 2022-02-10 MED ORDER — OXYCODONE HCL 5 MG PO TABS: 5.0000 mg | ORAL_TABLET | Freq: Four times a day (QID) | ORAL | 0 refills | Status: AC | PRN

## 2022-02-10 NOTE — Progress Notes (Signed)
Patient ID: Dawn Thomas, female    DOB: 1976-07-16, 46 y.o.   MRN: [id number]  Chief Complaint  Patient presents with   Pre-op Exam      ICD-10-CM   1. Morbid obesity ([place])  E66.01     2. Panniculitis  M79.3     3. Symptomatic mammary hypertrophy  N62     4. Chronic bilateral thoracic back pain  M54.6    G89.29       History of Present Illness: Dawn Thomas is a 46 y.o.  female  with a history of panniculitis.  She presents for preoperative evaluation for upcoming procedure, panniculectomy, scheduled for 03/07/2022 with Dr. Marla Roe.  The patient has not had problems with anesthesia.  No personal history of DVT or PE.  She does report her mother has history of DVT in her legs as well as a history of a PE after diagnosis of pneumonia.  She also reports her sister has a history of DVT, attributes this to her lupus and poor control. She does not have any family or personal history of bleeding or clotting disorders.  She does have a history of coronary artery calcification on CAT scan, chronic systolic congestive heart failure.  Summary of Previous Visit: History of gastric sleeve in 2008, went from 410 pounds to 282 pounds.  She is not a smoker, not a diabetic.  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 Hypertension, history of gout due to renal impairment, coronary artery calcification seen on CAT scan, GERD, history of gastric sleeve, OSA -resolved with weight loss.  History of a total of 4 miscarriages.  She reports that her OSA has resolved with weight loss.  She reports that she has discussed her coronary  artery calcification with PCP and is going to start a statin to decrease her cholesterol.  She is not having any cardiac or pulmonary issues at this point.  She denies any current cardiac symptoms.  Patient with history of echocardiogram January 2022 which was relatively normal, left ventricular ejection fraction estimated to be 50 to 55% with low normal left ventricular function.   Patient was recently evaluated by nephrology to discuss CKD and iron deficiency.  Most recent hemoglobin trending down to 10.6, she is unable to tolerate oral iron and was set up for iron infusions. On IV iron infusions -reports this has been very helpful.  She has had 1 infusion so far and a lot of her iron deficient symptoms has resolved.  She reports she has an additional and last infusion today.  Past Medical History: Allergies: Allergies  Allergen Reactions   Maxalt [Rizatriptan Benzoate] Shortness Of Breath   Nsaids Other (See Comments)    Renal insufficiency.   Omeprazole Hives   Adhesive [Tape] Other (See Comments)    Burns skin   Hydrocodone Hives   Penicillins Hives   Zithromax [Azithromycin] Other (See Comments)    "messes with my breathing"    Current Medications:  Current Outpatient Medications:    acetaminophen (TYLENOL) 500 MG tablet, Take 1,000 mg by mouth every 6 (six) hours as needed for mild pain., Disp: , Rfl:  albuterol (PROVENTIL HFA;VENTOLIN HFA) 108 (90 BASE) MCG/ACT inhaler, Inhale 2 puffs into the lungs every 6 (six) hours as needed for wheezing or shortness of breath. , Disp: , Rfl:    Ergocalciferol 50 MCG (2000 UT) TABS, Take 2,000 Units by mouth daily., Disp: , Rfl:    levothyroxine (SYNTHROID, LEVOTHROID) 75 MCG tablet, Take 75 mcg by mouth daily. , Disp: , Rfl:    NIFEdipine (ADALAT CC) 60 MG 24 hr tablet, Take 60 mg by mouth 2 (two) times daily., Disp: , Rfl:    pantoprazole (PROTONIX) 40 MG tablet, Take 40 mg by mouth daily., Disp: , Rfl:    Prenatal Vit-Fe Fumarate-FA  (PRENATAL MULTIVITAMIN) TABS tablet, Take 1 tablet by mouth daily at 12 noon., Disp: , Rfl:    sertraline (ZOLOFT) 50 MG tablet, Take 50 mg by mouth daily., Disp: , Rfl:    hydrALAZINE (APRESOLINE) 50 MG tablet, Take 1 tablet (50 mg total) by mouth every 8 (eight) hours., Disp: 270 tablet, Rfl: 3  Past Medical Problems: 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   Renal disorder    stage 3- Dr [facility] - Dr Olivia Mackie   Renal insufficiency    Sleep apnea    mild    Tendonitis    Thyroid disease     Past Surgical History: Past Surgical History:  Procedure Laterality Date   CESAREAN SECTION     CHOLECYSTECTOMY     DILATION AND CURETTAGE OF UTERUS     LAPAROSCOPIC GASTRIC SLEEVE RESECTION N/A 05/30/2017   Procedure: LAPAROSCOPIC GASTRIC SLEEVE RESECTION WITH UPPPER ENDO AND HIATAL HERNIA REPAIR;  Surgeon: Kieth Brightly, Arta Bruce, MD;  Location: [facility];  Service: General;  Laterality: N/A;    Social History: Social History   Socioeconomic History   Marital status: Single    Spouse name: Not on file   Number of children: Not on file   Years of education: Not on file   Highest education level: Not on file  Occupational History   Not on file  Tobacco Use   Smoking status: Never   Smokeless tobacco: Never  Vaping Use   Vaping Use: Never used  Substance and Sexual Activity   Alcohol use: No   Drug use: No   Sexual activity: Not on file  Other Topics Concern   Not on file  Social History Narrative   Not on file   Social Determinants of Health   Financial Resource Strain: Not on file  Food Insecurity: Food Insecurity Present (07/19/2017)   Hunger Vital Sign    Worried About Running Out of Food in the Last Year: Sometimes true    Ran Out of Food in the Last Year: Never true  Transportation Needs: Not on file  Physical  Activity: Not on file  Stress: Not on file  Social Connections: Not on file  Intimate Partner Violence: Not on file    Family History: Family History  Problem Relation Age of Onset   Diabetes Mother    Hypertension Mother    Hypertension Father    Diabetes Father    Sudden death Father    Heart attack Neg Hx    Hyperlipidemia Neg Hx     Review of Systems: Review of Systems  Constitutional: Negative.   Respiratory:  Negative for cough  and shortness of breath.   Cardiovascular: Negative.   Gastrointestinal: Negative.   Neurological:  Negative for dizziness and weakness.    Physical Exam: Vital Signs BP (!) 166/83 (BP Location: Left Arm, Patient Position: Sitting, Cuff Size: Large)   Pulse 79   Ht 5\' 6"  (1.676 m)   Wt 271 lb 6.4 oz (123.1 kg)   SpO2 97%   BMI 43.81 kg/m   Physical Exam  Constitutional:      General: Not in acute distress.    Appearance: Normal appearance. Not ill-appearing.  HENT:     Head: Normocephalic and atraumatic.  Eyes:     Pupils: Pupils are equal, round Neck:     Musculoskeletal: Normal range of motion.  Cardiovascular:     Rate and Rhythm: Normal rate    Pulses: Normal pulses.  Pulmonary:     Effort: Pulmonary effort is normal. No respiratory distress.  Abdominal:     General: Abdomen is flat. There is no distension.  Musculoskeletal: Normal range of motion.  Skin:    General: Skin is warm and dry.     Findings: No erythema or rash.  Neurological:     General: No focal deficit present.     Mental Status: Alert and oriented to person, place, and time. Mental status is at baseline.     Motor: No weakness.  Psychiatric:        Mood and Affect: Mood normal.        Behavior: Behavior normal.    Assessment/Plan: The patient is scheduled for panniculectomy with Dr. Marla Roe.  Risks, benefits, and alternatives of procedure discussed, questions answered and consent obtained.    Smoking Status: Non-smoker counseling Given?   N/A  Caprini Score: 10; Risk Factors include: Age, bilateral lower extremity edema, family history of thrombosis, BMI greater than 40, greater than 3 miscarriages and length of planned surgery. Recommendation for mechanical and possible pharmacological prophylaxis. Encourage early ambulation.  Will discuss need for postop Lovenox with Dr. Marla Roe.  Pictures obtained: @consult   Post-op Rx sent to pharmacy: Oxycodone, Zofran, doxycycline  Patient was provided with the  General Surgical Risk consent document and Pain Medication Agreement prior to their appointment.  They had adequate time to read through the risk consent documents and Pain Medication Agreement. We also discussed them in person together during this preop appointment. All of their questions were answered to their satisfaction.  Recommended calling if they have any further questions.  Risk consent form and Pain Medication Agreement to be scanned into patient's chart.  The risk that can be encountered for this procedure were discussed and include the following but not limited to these: asymmetry, fluid accumulation, firmness of the tissue, skin loss, decrease or no sensation, fat necrosis, bleeding, infection, healing delay.  Deep vein thrombosis, cardiac and pulmonary complications are risks to any procedure.  There are risks of anesthesia, changes to skin sensation and injury to nerves or blood vessels.  The muscle can be temporarily or permanently injured.  You may have an allergic reaction to tape, suture, glue, blood products which can result in skin discoloration, swelling, pain, skin lesions, poor healing.  Any of these can lead to the need for revisonal surgery or stage procedures.  Weight gain and weigh loss can also effect the long term appearance. The results are not guaranteed to last a lifetime.  Future surgery may be required.    We will send clearance to patient's PCP to confirm medical clearance prior to  surgery.  Electronically signed by: Carola Rhine Ladonya Jerkins, PA-C 02/10/2022 2:16 PM

## 2022-02-10 NOTE — H&P (View-Only) (Signed)
Patient ID: Dawn Thomas, female    DOB: [birth year], 46 y.o.   MRN: [id number]  Chief Complaint  Patient presents with   Pre-op Exam      ICD-10-CM   1. Morbid obesity ([place])  E66.01     2. Panniculitis  M79.3     3. Symptomatic mammary hypertrophy  N62     4. Chronic bilateral thoracic back pain  M54.6    G89.29       History of Present Illness: Dawn Thomas is a 46 y.o.  female  with a history of panniculitis.  She presents for preoperative evaluation for upcoming procedure, panniculectomy, scheduled for 03/07/2022 with Dr. Marla Roe.  The patient has not had problems with anesthesia.  No personal history of DVT or PE.  She does report her mother has history of DVT in her legs as well as a history of a PE after diagnosis of pneumonia.  She also reports her sister has a history of DVT, attributes this to her lupus and poor control. She does not have any family or personal history of bleeding or clotting disorders.  She does have a history of coronary artery calcification on CAT scan, chronic systolic congestive heart failure.  Summary of Previous Visit: History of gastric sleeve in 2008, went from 410 pounds to 282 pounds.  She is not a smoker, not a diabetic.  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 Hypertension, history of gout due to renal impairment, coronary artery calcification seen on CAT scan, GERD, history of gastric sleeve, OSA -resolved with weight loss.  History of a total of 4 miscarriages.  She reports that her OSA has resolved with weight loss.  She reports that she has discussed her coronary  artery calcification with PCP and is going to start a statin to decrease her cholesterol.  She is not having any cardiac or pulmonary issues at this point.  She denies any current cardiac symptoms.  Patient with history of echocardiogram January 2022 which was relatively normal, left ventricular ejection fraction estimated to be 50 to 55% with low normal left ventricular function.   Patient was recently evaluated by nephrology to discuss CKD and iron deficiency.  Most recent hemoglobin trending down to 10.6, she is unable to tolerate oral iron and was set up for iron infusions. On IV iron infusions -reports this has been very helpful.  She has had 1 infusion so far and a lot of her iron deficient symptoms has resolved.  She reports she has an additional and last infusion today.  Past Medical History: Allergies: Allergies  Allergen Reactions   Maxalt [Rizatriptan Benzoate] Shortness Of Breath   Nsaids Other (See Comments)    Renal insufficiency.   Omeprazole Hives   Adhesive [Tape] Other (See Comments)    Burns skin   Hydrocodone Hives   Penicillins Hives   Zithromax [Azithromycin] Other (See Comments)    "messes with my breathing"    Current Medications:  Current Outpatient Medications:    acetaminophen (TYLENOL) 500 MG tablet, Take 1,000 mg by mouth every 6 (six) hours as needed for mild pain., Disp: , Rfl:  albuterol (PROVENTIL HFA;VENTOLIN HFA) 108 (90 BASE) MCG/ACT inhaler, Inhale 2 puffs into the lungs every 6 (six) hours as needed for wheezing or shortness of breath. , Disp: , Rfl:    Ergocalciferol 50 MCG (2000 UT) TABS, Take 2,000 Units by mouth daily., Disp: , Rfl:    levothyroxine (SYNTHROID, LEVOTHROID) 75 MCG tablet, Take 75 mcg by mouth daily. , Disp: , Rfl:    NIFEdipine (ADALAT CC) 60 MG 24 hr tablet, Take 60 mg by mouth 2 (two) times daily., Disp: , Rfl:    pantoprazole (PROTONIX) 40 MG tablet, Take 40 mg by mouth daily., Disp: , Rfl:    Prenatal Vit-Fe Fumarate-FA  (PRENATAL MULTIVITAMIN) TABS tablet, Take 1 tablet by mouth daily at 12 noon., Disp: , Rfl:    sertraline (ZOLOFT) 50 MG tablet, Take 50 mg by mouth daily., Disp: , Rfl:    hydrALAZINE (APRESOLINE) 50 MG tablet, Take 1 tablet (50 mg total) by mouth every 8 (eight) hours., Disp: 270 tablet, Rfl: 3  Past Medical Problems: 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   Renal disorder    stage 3- [facility] - Dr Olivia Mackie   Renal insufficiency    Sleep apnea    mild    Tendonitis    Thyroid disease     Past Surgical History: Past Surgical History:  Procedure Laterality Date   CESAREAN SECTION     CHOLECYSTECTOMY     DILATION AND CURETTAGE OF UTERUS     LAPAROSCOPIC GASTRIC SLEEVE RESECTION N/A 05/30/2017   Procedure: LAPAROSCOPIC GASTRIC SLEEVE RESECTION WITH UPPPER ENDO AND HIATAL HERNIA REPAIR;  Surgeon: Kieth Brightly, Arta Bruce, MD;  Location: [facility];  Service: General;  Laterality: N/A;    Social History: Social History   Socioeconomic History   Marital status: Single    Spouse name: Not on file   Number of children: Not on file   Years of education: Not on file   Highest education level: Not on file  Occupational History   Not on file  Tobacco Use   Smoking status: Never   Smokeless tobacco: Never  Vaping Use   Vaping Use: Never used  Substance and Sexual Activity   Alcohol use: No   Drug use: No   Sexual activity: Not on file  Other Topics Concern   Not on file  Social History Narrative   Not on file   Social Determinants of Health   Financial Resource Strain: Not on file  Food Insecurity: Food Insecurity Present (07/19/2017)   Hunger Vital Sign    Worried About Running Out of Food in the Last Year: Sometimes true    Ran Out of Food in the Last Year: Never true  Transportation Needs: Not on file  Physical  Activity: Not on file  Stress: Not on file  Social Connections: Not on file  Intimate Partner Violence: Not on file    Family History: Family History  Problem Relation Age of Onset   Diabetes Mother    Hypertension Mother    Hypertension Father    Diabetes Father    Sudden death Father    Heart attack Neg Hx    Hyperlipidemia Neg Hx     Review of Systems: Review of Systems  Constitutional: Negative.   Respiratory:  Negative for cough  and shortness of breath.   Cardiovascular: Negative.   Gastrointestinal: Negative.   Neurological:  Negative for dizziness and weakness.    Physical Exam: Vital Signs BP (!) 166/83 (BP Location: Left Arm, Patient Position: Sitting, Cuff Size: Large)   Pulse 79   Ht 5\' 6"  (1.676 m)   Wt 271 lb 6.4 oz (123.1 kg)   SpO2 97%   BMI 43.81 kg/m   Physical Exam  Constitutional:      General: Not in acute distress.    Appearance: Normal appearance. Not ill-appearing.  HENT:     Head: Normocephalic and atraumatic.  Eyes:     Pupils: Pupils are equal, round Neck:     Musculoskeletal: Normal range of motion.  Cardiovascular:     Rate and Rhythm: Normal rate    Pulses: Normal pulses.  Pulmonary:     Effort: Pulmonary effort is normal. No respiratory distress.  Abdominal:     General: Abdomen is flat. There is no distension.  Musculoskeletal: Normal range of motion.  Skin:    General: Skin is warm and dry.     Findings: No erythema or rash.  Neurological:     General: No focal deficit present.     Mental Status: Alert and oriented to person, place, and time. Mental status is at baseline.     Motor: No weakness.  Psychiatric:        Mood and Affect: Mood normal.        Behavior: Behavior normal.    Assessment/Plan: The patient is scheduled for panniculectomy with Dr. Marla Roe.  Risks, benefits, and alternatives of procedure discussed, questions answered and consent obtained.    Smoking Status: Non-smoker counseling Given?   N/A  Caprini Score: 10; Risk Factors include: Age, bilateral lower extremity edema, family history of thrombosis, BMI greater than 40, greater than 3 miscarriages and length of planned surgery. Recommendation for mechanical and possible pharmacological prophylaxis. Encourage early ambulation.  Will discuss need for postop Lovenox with Dr. Marla Roe.  Pictures obtained: @consult   Post-op Rx sent to pharmacy: Oxycodone, Zofran, doxycycline  Patient was provided with the  General Surgical Risk consent document and Pain Medication Agreement prior to their appointment.  They had adequate time to read through the risk consent documents and Pain Medication Agreement. We also discussed them in person together during this preop appointment. All of their questions were answered to their satisfaction.  Recommended calling if they have any further questions.  Risk consent form and Pain Medication Agreement to be scanned into patient's chart.  The risk that can be encountered for this procedure were discussed and include the following but not limited to these: asymmetry, fluid accumulation, firmness of the tissue, skin loss, decrease or no sensation, fat necrosis, bleeding, infection, healing delay.  Deep vein thrombosis, cardiac and pulmonary complications are risks to any procedure.  There are risks of anesthesia, changes to skin sensation and injury to nerves or blood vessels.  The muscle can be temporarily or permanently injured.  You may have an allergic reaction to tape, suture, glue, blood products which can result in skin discoloration, swelling, pain, skin lesions, poor healing.  Any of these can lead to the need for revisonal surgery or stage procedures.  Weight gain and weigh loss can also effect the long term appearance. The results are not guaranteed to last a lifetime.  Future surgery may be required.    We will send clearance to patient's PCP to confirm medical clearance prior to  surgery.  Electronically signed by: Carola Rhine Hutch Rhett, PA-C 02/10/2022 2:16 PM

## 2022-02-11 ENCOUNTER — Telehealth: Payer: Self-pay

## 2022-02-11 NOTE — Telephone Encounter (Signed)
Faxed surgery clearance for panniculectomy surgery to Kandyce Rud, PA-C

## 2022-02-14 ENCOUNTER — Encounter: Payer: Medicaid Other | Admitting: Physician Assistant

## 2022-02-28 NOTE — Pre-Procedure Instructions (Signed)
Surgical Instructions    Your procedure is scheduled on March 07, 2022.  Report to [facility] Main Entrance "A" at 5:30 A.M., then check in with the Admitting office.  Call this number if you have problems the morning of surgery:  [phone]   If you have any questions prior to your surgery date call [phone]: Open Monday-Friday 8am-4pm    Remember:  Do not eat after midnight the night before your surgery  You may drink clear liquids until 4:30 AM the morning of your surgery.   Clear liquids allowed are: Water, Non-Citrus Juices (without pulp), Carbonated Beverages, Clear Tea, Black Coffee Only (NO MILK, CREAM OR POWDERED CREAMER of any kind), and Gatorade.    Take these medicines the morning of surgery with A SIP OF WATER:  FLUoxetine (PROZAC)  hydrALAZINE (APRESOLINE)  levothyroxine (SYNTHROID)  pantoprazole (PROTONIX)  NIFEdipine (ADALAT CC)   Take these medicines the morning of surgery AS NEEDED:  acetaminophen (TYLENOL)  albuterol (PROVENTIL HFA;VENTOLIN HFA) - please bring inhaler with you to hospital  ondansetron [facility])  As of today, STOP taking any Aspirin (unless otherwise instructed by your surgeon) Aleve, Naproxen, Ibuprofen, Motrin, Advil, Goody's, BC's, all herbal medications, fish oil, and all vitamins.                     Do NOT Smoke (Tobacco/Vaping) for 24 hours prior to your procedure.  If you use a CPAP at night, you may bring your mask/headgear for your overnight stay.   Contacts, glasses, piercing's, hearing aid's, dentures or partials may not be worn into surgery, please bring cases for these belongings.    For patients admitted to the hospital, discharge time will be determined by your treatment team.   Patients discharged the day of surgery will not be allowed to drive home, and someone needs to stay with them for 24 hours.  SURGICAL WAITING ROOM VISITATION Patients having surgery or a procedure may have no more than 2 support people in the  waiting area - these visitors may rotate.   Children under the age of 27 must have an adult with them who is not the patient. If the patient needs to stay at the hospital during part of their recovery, the visitor guidelines for inpatient rooms apply. Pre-op nurse will coordinate an appropriate time for 1 support person to accompany patient in pre-op.  This support person may not rotate.   Please refer to the [facility] website for the visitor guidelines for Inpatients (after your surgery is over and you are in a regular room).    Special instructions:   [place]- Preparing For Surgery  Before surgery, you can play an important role. Because skin is not sterile, your skin needs to be as free of germs as possible. You can reduce the number of germs on your skin by washing with CHG (chlorahexidine gluconate) Soap before surgery.  CHG is an antiseptic cleaner which kills germs and bonds with the skin to continue killing germs even after washing.    Oral Hygiene is also important to reduce your risk of infection.  Remember - BRUSH YOUR TEETH THE MORNING OF SURGERY WITH YOUR REGULAR TOOTHPASTE  Please do not use if you have an allergy to CHG or antibacterial soaps. If your skin becomes reddened/irritated stop using the CHG.  Do not shave (including legs and underarms) for at least 48 hours prior to first CHG shower. It is OK to shave your face.  Please follow these instructions  carefully.   Shower the NIGHT BEFORE SURGERY and the MORNING OF SURGERY  If you chose to wash your hair, wash your hair first as usual with your normal shampoo.  After you shampoo, rinse your hair and body thoroughly to remove the shampoo.  Use CHG Soap as you would any other liquid soap. You can apply CHG directly to the skin and wash gently with a scrungie or a clean washcloth.   Apply the CHG Soap to your body ONLY FROM THE NECK DOWN.  Do not use on open wounds or open sores. Avoid contact with your eyes, ears,  mouth and genitals (private parts). Wash Face and genitals (private parts)  with your normal soap.   Wash thoroughly, paying special attention to the area where your surgery will be performed.  Thoroughly rinse your body with warm water from the neck down.  DO NOT shower/wash with your normal soap after using and rinsing off the CHG Soap.  Pat yourself dry with a CLEAN TOWEL.  Wear CLEAN PAJAMAS to bed the night before surgery  Place CLEAN SHEETS on your bed the night before your surgery  DO NOT SLEEP WITH PETS.   Day of Surgery: Take a shower with CHG soap. Do not wear jewelry or makeup Do not wear lotions, powders, perfumes/colognes, or deodorant. Do not shave 48 hours prior to surgery.  Men may shave face and neck. Do not bring valuables to the hospital.  [facility] is not responsible for any belongings or valuables. Do not wear nail polish, gel polish, artificial nails, or any other type of covering on natural nails (fingers and toes) If you have artificial nails or gel coating that need to be removed by a nail salon, please have this removed prior to surgery. Artificial nails or gel coating may interfere with anesthesia's ability to adequately monitor your vital signs. Wear Clean/Comfortable clothing the morning of surgery Remember to brush your teeth WITH YOUR REGULAR TOOTHPASTE.   Please read over the following fact sheets that you were given.    If you received a COVID test during your pre-op visit  it is requested that you wear a mask when out in public, stay away from anyone that may not be feeling well and notify your surgeon if you develop symptoms. If you have been in contact with anyone that has tested positive in the last 10 days please notify you surgeon.

## 2022-03-01 ENCOUNTER — Encounter (HOSPITAL_COMMUNITY)
Admission: RE | Admit: 2022-03-01 | Discharge: 2022-03-01 | Disposition: A | Discharge status: Home / Self Care | Payer: Medicaid Other | Source: Ambulatory Visit | Attending: Plastic Surgery | Admitting: Plastic Surgery

## 2022-03-01 ENCOUNTER — Other Ambulatory Visit: Payer: Self-pay

## 2022-03-01 ENCOUNTER — Encounter (HOSPITAL_COMMUNITY): Payer: Self-pay

## 2022-03-01 VITALS — BP 147/72 | HR 70 | Temp 98.0°F | Resp 19 | Ht 66.5 in | Wt 264.5 lb

## 2022-03-01 DIAGNOSIS — Z01818 Encounter for other preprocedural examination: Secondary | ICD-10-CM | POA: Insufficient documentation

## 2022-03-01 DIAGNOSIS — I10 Essential (primary) hypertension: Secondary | ICD-10-CM | POA: Diagnosis not present

## 2022-03-01 HISTORY — DX: Depression, unspecified: F32.A

## 2022-03-01 HISTORY — DX: Anemia, unspecified: D64.9

## 2022-03-01 HISTORY — DX: Gastro-esophageal reflux disease without esophagitis: K21.9

## 2022-03-01 LAB — BASIC METABOLIC PANEL
Anion gap: 8 (ref 5–15)
BUN: 21 mg/dL — ABNORMAL HIGH (ref 6–20)
CO2: 22 mmol/L (ref 22–32)
Calcium: 8.7 mg/dL — ABNORMAL LOW (ref 8.9–10.3)
Chloride: 111 mmol/L (ref 98–111)
Creatinine, Ser: 2.42 mg/dL — ABNORMAL HIGH (ref 0.44–1.00)
GFR, Estimated: 24 mL/min — ABNORMAL LOW (ref >60)
Glucose, Bld: 92 mg/dL (ref 70–99)
Potassium: 4.1 mmol/L (ref 3.5–5.1)
Sodium: 141 mmol/L (ref 135–145)

## 2022-03-01 LAB — CBC
HCT: 39.4 % (ref 36.0–46.0)
Hemoglobin: 12.5 g/dL (ref 12.0–15.0)
MCH: 29.8 pg (ref 26.0–34.0)
MCHC: 31.7 g/dL (ref 30.0–36.0)
MCV: 93.8 fL (ref 80.0–100.0)
Platelets: 243 10*3/uL (ref 150–400)
RBC: 4.2 MIL/uL (ref 3.87–5.11)
RDW: 15.8 % — ABNORMAL HIGH (ref 11.5–15.5)
WBC: 6.3 10*3/uL (ref 4.0–10.5)
nRBC: 0 % (ref 0.0–0.2)

## 2022-03-01 NOTE — Progress Notes (Signed)
PCP - Virginia Rochester, PA Cardiologist - denies  PPM/ICD - denies   Chest x-ray - 10/22/20 EKG - 03/01/22 Stress Test - pt states she had one around 20 years ago, no abn. Per pt ECHO - 08/20/20 Cardiac Cath - denies  Sleep Study - 2019, mild OSA. CPAP - Pt states she didn't need it after losing weight from bariatric surgery  DM- denies  ASA/Blood Thinner Instructions: n/a   ERAS Protcol - yes, no drink   COVID TEST- n/a   Anesthesia review: no  Patient denies shortness of breath, fever, cough and chest pain at PAT appointment   All instructions explained to the patient, with a verbal understanding of the material. Patient agrees to go over the instructions while at home for a better understanding. The opportunity to ask questions was provided.

## 2022-03-02 ENCOUNTER — Telehealth: Payer: Self-pay

## 2022-03-02 NOTE — Telephone Encounter (Signed)
error 

## 2022-03-02 NOTE — Telephone Encounter (Signed)
[place] office at [facility], spoke with Joellen Jersey. They have not received Surgical Clearance. Verified fax number was correct. Re-faxed again.

## 2022-03-07 ENCOUNTER — Encounter (HOSPITAL_COMMUNITY)
Admission: RE | Disposition: A | Discharge status: Home / Self Care | Payer: Self-pay | Source: Home / Self Care | Attending: Plastic Surgery

## 2022-03-07 ENCOUNTER — Encounter (HOSPITAL_COMMUNITY): Payer: Self-pay | Admitting: Plastic Surgery

## 2022-03-07 ENCOUNTER — Other Ambulatory Visit: Payer: Self-pay

## 2022-03-07 ENCOUNTER — Ambulatory Visit (HOSPITAL_COMMUNITY): Payer: Medicaid Other | Admitting: Certified Registered Nurse Anesthetist

## 2022-03-07 ENCOUNTER — Ambulatory Visit (HOSPITAL_BASED_OUTPATIENT_CLINIC_OR_DEPARTMENT_OTHER): Payer: Medicaid Other | Admitting: Certified Registered Nurse Anesthetist

## 2022-03-07 ENCOUNTER — Inpatient Hospital Stay (HOSPITAL_COMMUNITY)
Admission: RE | Admit: 2022-03-07 | Discharge: 2022-03-11 | DRG: 571 | Disposition: A | Discharge status: Home / Self Care | Payer: Medicaid Other | Attending: Plastic Surgery | Admitting: Plastic Surgery

## 2022-03-07 DIAGNOSIS — K219 Gastro-esophageal reflux disease without esophagitis: Secondary | ICD-10-CM | POA: Diagnosis present

## 2022-03-07 DIAGNOSIS — G43909 Migraine, unspecified, not intractable, without status migrainosus: Secondary | ICD-10-CM | POA: Diagnosis present

## 2022-03-07 DIAGNOSIS — J45909 Unspecified asthma, uncomplicated: Secondary | ICD-10-CM | POA: Diagnosis not present

## 2022-03-07 DIAGNOSIS — Z886 Allergy status to analgesic agent status: Secondary | ICD-10-CM

## 2022-03-07 DIAGNOSIS — G8929 Other chronic pain: Secondary | ICD-10-CM | POA: Diagnosis present

## 2022-03-07 DIAGNOSIS — I959 Hypotension, unspecified: Secondary | ICD-10-CM | POA: Diagnosis not present

## 2022-03-07 DIAGNOSIS — Z8249 Family history of ischemic heart disease and other diseases of the circulatory system: Secondary | ICD-10-CM

## 2022-03-07 DIAGNOSIS — D631 Anemia in chronic kidney disease: Secondary | ICD-10-CM | POA: Diagnosis present

## 2022-03-07 DIAGNOSIS — Z79899 Other long term (current) drug therapy: Secondary | ICD-10-CM

## 2022-03-07 DIAGNOSIS — Z9884 Bariatric surgery status: Secondary | ICD-10-CM

## 2022-03-07 DIAGNOSIS — Z6841 Body Mass Index (BMI) 40.0 and over, adult: Secondary | ICD-10-CM

## 2022-03-07 DIAGNOSIS — Z9889 Other specified postprocedural states: Secondary | ICD-10-CM

## 2022-03-07 DIAGNOSIS — E039 Hypothyroidism, unspecified: Secondary | ICD-10-CM | POA: Diagnosis present

## 2022-03-07 DIAGNOSIS — I10 Essential (primary) hypertension: Secondary | ICD-10-CM | POA: Diagnosis not present

## 2022-03-07 DIAGNOSIS — Z881 Allergy status to other antibiotic agents status: Secondary | ICD-10-CM

## 2022-03-07 DIAGNOSIS — Z885 Allergy status to narcotic agent status: Secondary | ICD-10-CM

## 2022-03-07 DIAGNOSIS — Y9301 Activity, walking, marching and hiking: Secondary | ICD-10-CM | POA: Diagnosis not present

## 2022-03-07 DIAGNOSIS — M546 Pain in thoracic spine: Secondary | ICD-10-CM | POA: Diagnosis present

## 2022-03-07 DIAGNOSIS — I5022 Chronic systolic (congestive) heart failure: Secondary | ICD-10-CM | POA: Diagnosis present

## 2022-03-07 DIAGNOSIS — N051 Unspecified nephritic syndrome with focal and segmental glomerular lesions: Secondary | ICD-10-CM | POA: Diagnosis present

## 2022-03-07 DIAGNOSIS — Z888 Allergy status to other drugs, medicaments and biological substances status: Secondary | ICD-10-CM

## 2022-03-07 DIAGNOSIS — N1832 Chronic kidney disease, stage 3b: Principal | ICD-10-CM

## 2022-03-07 DIAGNOSIS — Z79890 Hormone replacement therapy (postmenopausal): Secondary | ICD-10-CM

## 2022-03-07 DIAGNOSIS — W1830XA Fall on same level, unspecified, initial encounter: Secondary | ICD-10-CM | POA: Diagnosis not present

## 2022-03-07 DIAGNOSIS — F32A Depression, unspecified: Secondary | ICD-10-CM | POA: Diagnosis present

## 2022-03-07 DIAGNOSIS — M793 Panniculitis, unspecified: Principal | ICD-10-CM

## 2022-03-07 DIAGNOSIS — R809 Proteinuria, unspecified: Secondary | ICD-10-CM | POA: Diagnosis present

## 2022-03-07 DIAGNOSIS — N179 Acute kidney failure, unspecified: Secondary | ICD-10-CM | POA: Diagnosis present

## 2022-03-07 DIAGNOSIS — N184 Chronic kidney disease, stage 4 (severe): Secondary | ICD-10-CM | POA: Diagnosis present

## 2022-03-07 DIAGNOSIS — Y92230 Patient room in hospital as the place of occurrence of the external cause: Secondary | ICD-10-CM | POA: Diagnosis not present

## 2022-03-07 DIAGNOSIS — M1030 Gout due to renal impairment, unspecified site: Secondary | ICD-10-CM | POA: Diagnosis present

## 2022-03-07 DIAGNOSIS — D62 Acute posthemorrhagic anemia: Secondary | ICD-10-CM | POA: Diagnosis not present

## 2022-03-07 DIAGNOSIS — Z9109 Other allergy status, other than to drugs and biological substances: Secondary | ICD-10-CM

## 2022-03-07 DIAGNOSIS — Z880 Allergy status to penicillin: Secondary | ICD-10-CM

## 2022-03-07 DIAGNOSIS — Z01818 Encounter for other preprocedural examination: Secondary | ICD-10-CM

## 2022-03-07 DIAGNOSIS — R319 Hematuria, unspecified: Secondary | ICD-10-CM | POA: Diagnosis present

## 2022-03-07 DIAGNOSIS — F39 Unspecified mood [affective] disorder: Secondary | ICD-10-CM | POA: Diagnosis present

## 2022-03-07 DIAGNOSIS — N62 Hypertrophy of breast: Secondary | ICD-10-CM | POA: Diagnosis present

## 2022-03-07 DIAGNOSIS — I130 Hypertensive heart and chronic kidney disease with heart failure and stage 1 through stage 4 chronic kidney disease, or unspecified chronic kidney disease: Secondary | ICD-10-CM | POA: Diagnosis present

## 2022-03-07 DIAGNOSIS — R55 Syncope and collapse: Secondary | ICD-10-CM | POA: Diagnosis not present

## 2022-03-07 DIAGNOSIS — I2510 Atherosclerotic heart disease of native coronary artery without angina pectoris: Secondary | ICD-10-CM | POA: Diagnosis present

## 2022-03-07 HISTORY — PX: PANNICULECTOMY: SHX5360

## 2022-03-07 LAB — POCT PREGNANCY, URINE: Preg Test, Ur: NEGATIVE

## 2022-03-07 SURGERY — PANNICULECTOMY
Anesthesia: General | Site: Abdomen

## 2022-03-07 MED ORDER — DIPHENHYDRAMINE HCL 50 MG/ML IJ SOLN
12.5000 mg | Freq: Four times a day (QID) | INTRAMUSCULAR | Status: DC | PRN
  Administered 2022-03-09: 12.5 mg via INTRAVENOUS
  Filled 2022-03-07: qty 1

## 2022-03-07 MED ORDER — PROPOFOL 10 MG/ML IV BOLUS
INTRAVENOUS | Status: AC
  Filled 2022-03-07: qty 20

## 2022-03-07 MED ORDER — LIDOCAINE-EPINEPHRINE 1 %-1:100000 IJ SOLN
INTRAMUSCULAR | Status: AC
Start: 2022-03-07 — End: ?
  Filled 2022-03-07: qty 1

## 2022-03-07 MED ORDER — ENOXAPARIN SODIUM 40 MG/0.4ML IJ SOSY: 40.0000 mg | PREFILLED_SYRINGE | INTRAMUSCULAR | 0 refills | Status: DC

## 2022-03-07 MED ORDER — FENTANYL CITRATE (PF) 100 MCG/2ML IJ SOLN
INTRAMUSCULAR | Status: AC
  Filled 2022-03-07: qty 2

## 2022-03-07 MED ORDER — LIDOCAINE-EPINEPHRINE 1 %-1:100000 IJ SOLN
INTRAMUSCULAR | Status: DC | PRN
  Administered 2022-03-07: 40 mL

## 2022-03-07 MED ORDER — ACETAMINOPHEN 325 MG PO TABS
325.0000 mg | ORAL_TABLET | Freq: Four times a day (QID) | ORAL | Status: DC
  Administered 2022-03-07 – 2022-03-11 (×8): 325 mg via ORAL
  Filled 2022-03-07 (×9): qty 1

## 2022-03-07 MED ORDER — CIPROFLOXACIN IN D5W 400 MG/200ML IV SOLN
400.0000 mg | Freq: Two times a day (BID) | INTRAVENOUS | Status: DC
  Administered 2022-03-07 – 2022-03-09 (×4): 400 mg via INTRAVENOUS
  Filled 2022-03-07 (×5): qty 200

## 2022-03-07 MED ORDER — ONDANSETRON HCL 4 MG/2ML IJ SOLN: 4.0000 mg | Freq: Four times a day (QID) | INTRAMUSCULAR | Status: DC | PRN

## 2022-03-07 MED ORDER — CHLORHEXIDINE GLUCONATE CLOTH 2 % EX PADS: 6.0000 | MEDICATED_PAD | Freq: Once | CUTANEOUS | Status: DC

## 2022-03-07 MED ORDER — DEXAMETHASONE SODIUM PHOSPHATE 10 MG/ML IJ SOLN
INTRAMUSCULAR | Status: DC | PRN
  Administered 2022-03-07: 10 mg via INTRAVENOUS

## 2022-03-07 MED ORDER — DIPHENHYDRAMINE HCL 12.5 MG/5ML PO ELIX: 12.5000 mg | ORAL_SOLUTION | Freq: Four times a day (QID) | ORAL | Status: DC | PRN

## 2022-03-07 MED ORDER — 0.9 % SODIUM CHLORIDE (POUR BTL) OPTIME
TOPICAL | Status: DC | PRN
  Administered 2022-03-07: 1000 mL

## 2022-03-07 MED ORDER — BISACODYL 10 MG RE SUPP: 10.0000 mg | Freq: Every day | RECTAL | Status: DC | PRN

## 2022-03-07 MED ORDER — ORAL CARE MOUTH RINSE: 15.0000 mL | Freq: Once | OROMUCOSAL | Status: AC

## 2022-03-07 MED ORDER — KCL IN DEXTROSE-NACL 20-5-0.45 MEQ/L-%-% IV SOLN
INTRAVENOUS | Status: DC
  Filled 2022-03-07 (×2): qty 1000

## 2022-03-07 MED ORDER — CHLORHEXIDINE GLUCONATE 0.12 % MT SOLN
15.0000 mL | Freq: Once | OROMUCOSAL | Status: AC
  Administered 2022-03-07: 15 mL via OROMUCOSAL
  Filled 2022-03-07: qty 15

## 2022-03-07 MED ORDER — SENNA 8.6 MG PO TABS
1.0000 | ORAL_TABLET | Freq: Two times a day (BID) | ORAL | Status: DC
Start: 2022-03-07 — End: 2022-03-11
  Administered 2022-03-07 – 2022-03-11 (×7): 8.6 mg via ORAL
  Filled 2022-03-07 (×8): qty 1

## 2022-03-07 MED ORDER — HYDROMORPHONE HCL 1 MG/ML IJ SOLN
INTRAMUSCULAR | Status: DC | PRN
  Administered 2022-03-07: .5 mg via INTRAVENOUS

## 2022-03-07 MED ORDER — CEFAZOLIN IN SODIUM CHLORIDE 3-0.9 GM/100ML-% IV SOLN
3.0000 g | INTRAVENOUS | Status: AC
  Administered 2022-03-07: 3 g via INTRAVENOUS
  Filled 2022-03-07: qty 100

## 2022-03-07 MED ORDER — LACTATED RINGERS IV SOLN: INTRAVENOUS | Status: DC

## 2022-03-07 MED ORDER — GLYCOPYRROLATE PF 0.2 MG/ML IJ SOSY
PREFILLED_SYRINGE | INTRAMUSCULAR | Status: DC | PRN
  Administered 2022-03-07: .2 mg via INTRAVENOUS

## 2022-03-07 MED ORDER — MORPHINE SULFATE (PF) 2 MG/ML IV SOLN
2.0000 mg | INTRAVENOUS | Status: DC | PRN
  Filled 2022-03-07: qty 1

## 2022-03-07 MED ORDER — OXYCODONE HCL 5 MG/5ML PO SOLN: 5.0000 mg | Freq: Once | ORAL | Status: DC | PRN

## 2022-03-07 MED ORDER — MIDAZOLAM HCL 2 MG/2ML IJ SOLN
INTRAMUSCULAR | Status: DC | PRN
  Administered 2022-03-07: 2 mg via INTRAVENOUS

## 2022-03-07 MED ORDER — ROCURONIUM BROMIDE 10 MG/ML (PF) SYRINGE
PREFILLED_SYRINGE | INTRAVENOUS | Status: DC | PRN
  Administered 2022-03-07: 50 mg via INTRAVENOUS
  Administered 2022-03-07: 10 mg via INTRAVENOUS

## 2022-03-07 MED ORDER — SODIUM CHLORIDE 0.9 % IV SOLN: INTRAVENOUS | Status: DC | PRN

## 2022-03-07 MED ORDER — EPHEDRINE SULFATE-NACL 50-0.9 MG/10ML-% IV SOSY
PREFILLED_SYRINGE | INTRAVENOUS | Status: DC | PRN
  Administered 2022-03-07: 5 mg via INTRAVENOUS

## 2022-03-07 MED ORDER — HYDROMORPHONE HCL 1 MG/ML IJ SOLN
INTRAMUSCULAR | Status: AC
  Filled 2022-03-07: qty 0.5

## 2022-03-07 MED ORDER — PHENYLEPHRINE HCL-NACL 20-0.9 MG/250ML-% IV SOLN
INTRAVENOUS | Status: AC
Start: 2022-03-07 — End: ?
  Filled 2022-03-07: qty 500

## 2022-03-07 MED ORDER — OXYCODONE HCL 5 MG PO TABS: 5.0000 mg | ORAL_TABLET | Freq: Once | ORAL | Status: DC | PRN

## 2022-03-07 MED ORDER — POLYETHYLENE GLYCOL 3350 17 G PO PACK: 17.0000 g | PACK | Freq: Every day | ORAL | Status: DC | PRN

## 2022-03-07 MED ORDER — MIDAZOLAM HCL 2 MG/2ML IJ SOLN
INTRAMUSCULAR | Status: AC
Start: 2022-03-07 — End: ?
  Filled 2022-03-07: qty 2

## 2022-03-07 MED ORDER — TRAMADOL HCL 50 MG PO TABS
50.0000 mg | ORAL_TABLET | Freq: Four times a day (QID) | ORAL | Status: DC | PRN
  Administered 2022-03-07 – 2022-03-09 (×3): 50 mg via ORAL
  Filled 2022-03-07 (×2): qty 1

## 2022-03-07 MED ORDER — TRAMADOL HCL 50 MG PO TABS
ORAL_TABLET | ORAL | Status: AC
  Filled 2022-03-07: qty 1

## 2022-03-07 MED ORDER — PROPOFOL 10 MG/ML IV BOLUS
INTRAVENOUS | Status: AC
Start: 2022-03-07 — End: ?
  Filled 2022-03-07: qty 20

## 2022-03-07 MED ORDER — OXYCODONE HCL 5 MG PO TABS
5.0000 mg | ORAL_TABLET | ORAL | Status: DC | PRN
  Administered 2022-03-08: 10 mg via ORAL
  Filled 2022-03-07: qty 2

## 2022-03-07 MED ORDER — LIDOCAINE HCL 1 % IJ SOLN
INTRAVENOUS | Status: DC | PRN
  Administered 2022-03-07: 1000 mL

## 2022-03-07 MED ORDER — FENTANYL CITRATE (PF) 100 MCG/2ML IJ SOLN
25.0000 ug | INTRAMUSCULAR | Status: DC | PRN
  Administered 2022-03-07 (×2): 50 ug via INTRAVENOUS
  Administered 2022-03-07 (×2): 25 ug via INTRAVENOUS

## 2022-03-07 MED ORDER — FENTANYL CITRATE (PF) 250 MCG/5ML IJ SOLN
INTRAMUSCULAR | Status: DC | PRN
  Administered 2022-03-07: 50 ug via INTRAVENOUS
  Administered 2022-03-07: 100 ug via INTRAVENOUS
  Administered 2022-03-07 (×2): 50 ug via INTRAVENOUS

## 2022-03-07 MED ORDER — PROPOFOL 10 MG/ML IV BOLUS
INTRAVENOUS | Status: DC | PRN
  Administered 2022-03-07: 200 mg via INTRAVENOUS
  Administered 2022-03-07: 40 mg via INTRAVENOUS

## 2022-03-07 MED ORDER — BUPIVACAINE HCL (PF) 0.25 % IJ SOLN
INTRAMUSCULAR | Status: AC
  Filled 2022-03-07: qty 30

## 2022-03-07 MED ORDER — SUGAMMADEX SODIUM 200 MG/2ML IV SOLN
INTRAVENOUS | Status: DC | PRN
  Administered 2022-03-07: 200 mg via INTRAVENOUS

## 2022-03-07 MED ORDER — FENTANYL CITRATE (PF) 250 MCG/5ML IJ SOLN
INTRAMUSCULAR | Status: AC
  Filled 2022-03-07: qty 5

## 2022-03-07 MED ORDER — ONDANSETRON HCL 4 MG/2ML IJ SOLN
INTRAMUSCULAR | Status: DC | PRN
  Administered 2022-03-07: 4 mg via INTRAVENOUS

## 2022-03-07 MED ORDER — ONDANSETRON 4 MG PO TBDP: 4.0000 mg | ORAL_TABLET | Freq: Four times a day (QID) | ORAL | Status: DC | PRN

## 2022-03-07 MED ORDER — LIDOCAINE 2% (20 MG/ML) 5 ML SYRINGE
INTRAMUSCULAR | Status: DC | PRN
  Administered 2022-03-07: 100 mg via INTRAVENOUS

## 2022-03-07 MED ORDER — SODIUM BICARBONATE 4.2 % IV SOLN
Freq: Once | INTRAVENOUS | Status: DC
  Filled 2022-03-07: qty 50

## 2022-03-07 SURGICAL SUPPLY — 55 items
APPLIER CLIP 9.375 MED OPEN (MISCELLANEOUS) ×2
BAG COUNTER SPONGE SURGICOUNT (BAG) ×2 IMPLANT
BINDER ABDOMINAL 10 UNV 27-48 (MISCELLANEOUS) IMPLANT
BINDER ABDOMINAL 12 SM 30-45 (SOFTGOODS) ×1 IMPLANT
BINDER ABDOMINAL 12 XL 75-84 (SOFTGOODS) ×1 IMPLANT
BIOPATCH RED 1 DISK 7.0 (GAUZE/BANDAGES/DRESSINGS) ×1 IMPLANT
BLADE CLIPPER SURG (BLADE) IMPLANT
CLIP APPLIE 9.375 MED OPEN (MISCELLANEOUS) IMPLANT
COLLAGEN CELLERATERX 5 GRAM (Miscellaneous) ×2 IMPLANT
DERMABOND ADVANCED (GAUZE/BANDAGES/DRESSINGS) ×1
DERMABOND ADVANCED .7 DNX12 (GAUZE/BANDAGES/DRESSINGS) ×2 IMPLANT
DRAIN CHANNEL 19F RND (DRAIN) ×2 IMPLANT
DRAPE HALF SHEET 40X57 (DRAPES) ×4 IMPLANT
DRAPE INCISE IOBAN 66X45 STRL (DRAPES) ×2 IMPLANT
DRSG OPSITE POSTOP 4X12 (GAUZE/BANDAGES/DRESSINGS) ×1 IMPLANT
DRSG PAD ABDOMINAL 8X10 ST (GAUZE/BANDAGES/DRESSINGS) ×5 IMPLANT
DRSG TEGADERM 2-3/8X2-3/4 SM (GAUZE/BANDAGES/DRESSINGS) ×1 IMPLANT
ELECT BLADE 4.0 EZ CLEAN MEGAD (MISCELLANEOUS) ×2
ELECT REM PT RETURN 9FT ADLT (ELECTROSURGICAL) ×2
ELECTRODE BLDE 4.0 EZ CLN MEGD (MISCELLANEOUS) ×1 IMPLANT
ELECTRODE REM PT RTRN 9FT ADLT (ELECTROSURGICAL) ×1 IMPLANT
EVACUATOR SILICONE 100CC (DRAIN) ×2 IMPLANT
GLOVE SURG ENC MOIS LTX SZ6.5 (GLOVE) ×6 IMPLANT
GLOVE SURG ENC MOIS LTX SZ7.5 (GLOVE) ×4 IMPLANT
GOWN STRL REUS W/ TWL LRG LVL3 (GOWN DISPOSABLE) ×2 IMPLANT
GOWN STRL REUS W/TWL LRG LVL3 (GOWN DISPOSABLE) ×2
KIT BASIN OR (CUSTOM PROCEDURE TRAY) ×2 IMPLANT
NDL HYPO 25X1 1.5 SAFETY (NEEDLE) ×1 IMPLANT
NEEDLE HYPO 25X1 1.5 SAFETY (NEEDLE) ×2 IMPLANT
NS IRRIG 1000ML POUR BTL (IV SOLUTION) ×2 IMPLANT
PACK GENERAL/GYN (CUSTOM PROCEDURE TRAY) ×2 IMPLANT
PACK UNIVERSAL I (CUSTOM PROCEDURE TRAY) ×2 IMPLANT
PENCIL SMOKE EVACUATOR (MISCELLANEOUS) ×2 IMPLANT
PIN SAFETY STERILE (MISCELLANEOUS) IMPLANT
SLEEVE SCD COMPRESS KNEE MED (STOCKING) ×2 IMPLANT
SPONGE T-LAP 18X18 ~~LOC~~+RFID (SPONGE) ×1 IMPLANT
STAPLER VISISTAT 35W (STAPLE) ×2 IMPLANT
STRIP CLOSURE SKIN 1/2X4 (GAUZE/BANDAGES/DRESSINGS) ×1 IMPLANT
SUT MNCRL AB 4-0 PS2 18 (SUTURE) ×11 IMPLANT
SUT MON AB 3-0 SH 27 (SUTURE) ×5
SUT MON AB 3-0 SH27 (SUTURE) ×2 IMPLANT
SUT MON AB 5-0 PS2 18 (SUTURE) ×2 IMPLANT
SUT PDS AB 2-0 CT1 27 (SUTURE) ×5 IMPLANT
SUT PDS AB 3-0 SH 27 (SUTURE) ×5 IMPLANT
SUT SILK 3 0 SH 30 (SUTURE) ×2 IMPLANT
SUT VIC AB 3-0 SH 27 (SUTURE)
SUT VIC AB 3-0 SH 27X BRD (SUTURE) ×1 IMPLANT
SUT VICRYL 4-0 PS2 18IN ABS (SUTURE) ×2 IMPLANT
SYR CONTROL 10ML LL (SYRINGE) ×2 IMPLANT
TOWEL GREEN STERILE FF (TOWEL DISPOSABLE) ×4 IMPLANT
TRAY FOLEY W/BAG SLVR 16FR (SET/KITS/TRAYS/PACK) ×1
TRAY FOLEY W/BAG SLVR 16FR ST (SET/KITS/TRAYS/PACK) IMPLANT
TUBING INFILTRATION IT-10001 (TUBING) ×1 IMPLANT
TUBING SET GRADUATE ASPIR 12FT (MISCELLANEOUS) ×1 IMPLANT
UNDERPAD 30X36 HEAVY ABSORB (UNDERPADS AND DIAPERS) ×4 IMPLANT

## 2022-03-07 NOTE — Discharge Instructions (Addendum)
IMPORTANT INFORMATION REGARDING YOUR CARE  You need to schedule follow-up with your nephrologist (kidney doctor) as soon as possible (within 1 to 2 weeks) after discharge to discuss ongoing recommendations for kidney function management.  Recommend discussing additional iron infusions with them for your anemia.  You need to schedule follow-up with your primary care physician as soon as possible (within 1 to 2 weeks) for recheck of your blood work such as a CBC and BMP.  Close follow-up with your plastic surgery office is recommended, you have a new scheduled appointment on 03/15/2022 at 9:30 AM.  Do not start the Lovenox (blood thinning injections) that were prescribed postoperatively.  Avoid use of any ibuprofen, naproxen for pain control due to your kidney function.  Please call your surgeon's office if any of the following occur - [phone]  Fever 101 degrees F or greater  Excessive bleeding or fluid from the incision site.  Pain that increases over time without aid from the medications  Redness, warmth, or pus draining from incision sites  Persistent nausea or inability to take in liquids  Severe misshapen area that underwent the operation.  Seek medical attention if you develop any worsening symptoms such as dizziness, lightheadedness.   INSTRUCTIONS FOR AFTER ABDOMINAL SURGERY  You will likely have some questions about what to expect following your operation.  The following information will help you and your family understand what to expect when you get home.  Following these guidelines will help ensure a smooth recovery and reduce risks of complications.  Postoperative instructions include information on: diet, wound care, medications and physical activity.  DIET This surgery does not require a specific diet.  However, the healthier you eat the better your body can heal. It is important to increasing your protein intake.  Limit foods with high sugar and  carbohydrate content.   Focus on vegetables, meat and other protein sources if you are vegan or vegetarian.  If you undergo liposuction during your procedure it is very important to drink 8 oz of water every hour while awake for 2 days.  If your urine is bright yellow, then it is concentrated, and you need to drink more water.  If you find you are persistently nauseated or unable to take in liquids let us know.  NO TOBACCO USE or EXPOSURE.  This will slow your healing process and increase the risk of a wound.  WOUND CARE Leave the abdominal binder in place for 3 days.  Then you can remove it and shower.  Replace the binder or spanx after your shower.   You may have Topifoam or Lipofoam on.  It is soft and spongy and helps keep you from getting creases if you have liposuction.  This can be removed before the shower and then replaced.  If you need more it is available on [place] as lipofoam.  If you have steri-strips / tape directly attached to your skin leave them in place. It is OK to get these wet.  No baths, pools or hot tubs for four weeks. We close your incision to leave the smallest and best-looking scar. No ointment or creams on your incisions until cleared by your surgeon.  No Neosporin (Too many skin reactions with this one).  After the steri-strips are off can use Mederma or Skinuva and start massaging the scar. Continue to wear the binder/spanx or Ace wrap around the clock, including while sleeping, for 6 weeks. This provides added comfort and helps reduce the fluid accumulation at the  surgery site.  ACTIVITY No heavy lifting until cleared by the doctor.  For example, no more than a half-gallon of milk.  It is OK to walk and you are encouraged to move your legs to help decrease your risk of getting a blood clot.  It will also help keep you from getting deconditioned.  Every 1 to 2 hours get up and walk for 5 minutes. This will help with a quicker recovery back to normal.  Let pain be your guide so you don't do too much.      SLEEPING / RESTING Sleeping and resting should be in the jack-knife or bent forward position with your head elevated.  This will help reduce pulling on your abdominal incision.  You can elevate your head and upper back with a few pillows and place a pillow under your knees.  Avoid stomach sleeping for 3 months.   WORK Everyone returns to work at different times. As a rough guide, most people take 1 - 2 weeks off prior to returning to work. If you need documentation for your job give them to the front staff for processing.  DRIVING Arrange for someone to bring you home from the hospital.  You may be able to drive a few days after surgery but not while taking any narcotics or valium.  This is for your safety as well as others sharing the road with you.  BOWEL MOVEMENTS Constipation can occur after anesthesia and while taking pain medication.  It is important to stay ahead for your comfort.  We recommend taking Milk of Magnesia (2 tablespoons; twice a day) while taking the pain pills.  MEDICATIONS (you may receive and should be started after surgery) At your preoperative visit for you history and physical you were given the following medications: Antibiotic: Start this medication when you get home and take according to the instructions on the bottle. Zofran 4 mg:  This is to treat nausea and vomiting.  You can take this every 6 hours as needed and only if needed. Norco (hydrocodone/acetaminophen) 5/325 mg:  This is only to be used after you have taken the Motrin or the Tylenol. Every 8 hours as needed.

## 2022-03-07 NOTE — Anesthesia Preprocedure Evaluation (Addendum)
Anesthesia Evaluation  Patient identified by MRN, date of birth, ID band Patient awake    Reviewed: Allergy & Precautions, H&P , NPO status , Patient's Chart, lab work & pertinent test results  Airway Mallampati: II   Neck ROM: full    Dental   Pulmonary asthma , sleep apnea ,    breath sounds clear to auscultation       Cardiovascular hypertension,  Rhythm:regular Rate:Normal     Neuro/Psych  Headaches, PSYCHIATRIC DISORDERS Depression    GI/Hepatic GERD  ,  Endo/Other  Hypothyroidism   Renal/GU Renal InsufficiencyRenal disease     Musculoskeletal   Abdominal   Peds  Hematology   Anesthesia Other Findings   Reproductive/Obstetrics                            Anesthesia Physical Anesthesia Plan  ASA: 3  Anesthesia Plan: General   Post-op Pain Management:    Induction: Intravenous  PONV Risk Score and Plan: 3 and Ondansetron, Dexamethasone, Midazolam and Treatment may vary due to age or medical condition  Airway Management Planned: Oral ETT  Additional Equipment:   Intra-op Plan:   Post-operative Plan: Extubation in OR  Informed Consent: I have reviewed the patients History and Physical, chart, labs and discussed the procedure including the risks, benefits and alternatives for the proposed anesthesia with the patient or authorized representative who has indicated his/her understanding and acceptance.     Dental advisory given  Plan Discussed with: CRNA, Anesthesiologist and Surgeon  Anesthesia Plan Comments:         Anesthesia Quick Evaluation

## 2022-03-07 NOTE — Op Note (Signed)
Operative Report  Date of operation: 03/07/2022  Patient: Dawn Thomas, MRN: [id number], 46 y.o. female.   Date of birth: 06-30-1976  Location: [facility]  Preoperative Diagnosis: Panniculitis  Postoperative Diagnosis:  Same  Procedure: Panniculectomy  Surgeon:  Theodoro Kos Jacci Ruberg  Assistant:  Donnamarie Rossetti, PA  Anesthesia:  General  EBL:  100cc  Drains:  2 number 54 blake round drains  Condition:  Stable  Complications: None  Disposition: Recovery Room  Procedure in Detail: Patient was seen the morning of her surgery and marked out for the procedure. She was then given an IV and IV antibiotics. The patient was taken to the operating room and underwent general anesthesia.  A time out was called and all information was confirmed to be correct. SCD's and a pillow under the knees was in place. The patient was then prepped and draped in the standard sterile fashion. Local was placed into the incision and 2 stab incisions made through which 300 cc of tumescent was placed in each flank area. The flanks were liposuctioned and 400 cc removed from the sides. The planned lower incision was then incised and the incision taken down through the Scarpa's fascia to the rectus abdominus fascia. The skin and subcutaneous tissue was then lifted off the fascia up to the level of the umbilicus. The umbilicus is then circumscribed and freed up from the surrounding skin and freed down to the abdominal wall. The abdominal wall was then freed above the umbilicus but not widely up to the level of the xiphoid.  The amount that could be excised was confirmed. The pannus was excised and it weighed 2146 grams.  The mons was also suspended with 3-0 Monocryl.  The wound was irrigated with normal saline solution. Two #19 blake round drains were placed and secured with 3-0 Silk. The abdominal wall was closed with buried 2-0 and 3-0 PDS.  A running 3-0 Monocryl was then placed and  the skin closed with the 4-0 Monocryl.    The wound was then dressed with dermabond. ABD's and an abdominal binder were placed. Patient was allowed to wake up, extubated and taken on a stretcher in the flexed position to the recovery room. Family was notified at the end of the case.   The advanced practice practitioner (APP) assisted throughout the case.  The APP was essential in retraction and counter traction when needed to make the case progress smoothly.  This retraction and assistance made it possible to see the tissue plans for the procedure.  The assistance was needed for blood control, tissue re-approximation and assisted with closure of the incision site.

## 2022-03-07 NOTE — Anesthesia Postprocedure Evaluation (Signed)
Anesthesia Post Note  Patient: Dawn Thomas  Procedure(s) Performed: PANNICULECTOMY WITH LIPOSUCTION (Abdomen)     Patient location during evaluation: PACU Anesthesia Type: General Level of consciousness: awake and alert Pain management: pain level controlled Vital Signs Assessment: post-procedure vital signs reviewed and stable Respiratory status: spontaneous breathing, nonlabored ventilation, respiratory function stable and patient connected to nasal cannula oxygen Cardiovascular status: blood pressure returned to baseline and stable Postop Assessment: no apparent nausea or vomiting Anesthetic complications: no   No notable events documented.  Last Vitals:  Vitals:   03/07/22 1020 03/07/22 1035  BP: (!) 156/86 (!) 150/82  Pulse: 77 68  Resp: 13 14  Temp:    SpO2: 98% 99%    Last Pain:  Vitals:   03/07/22 1035  TempSrc:   PainSc: 9                  Khylei Wilms S

## 2022-03-07 NOTE — Interval H&P Note (Signed)
History and Physical Interval Note:  03/07/2022 7:13 AM  Dawn Thomas  has presented today for surgery, with the diagnosis of Panniculitis.  The various methods of treatment have been discussed with the patient and family. After consideration of risks, benefits and other options for treatment, the patient has consented to  Procedure(s) with comments: PANNICULECTOMY (N/A) - 4 hours as a surgical intervention.  The patient's history has been reviewed, patient examined, no change in status, stable for surgery.  I have reviewed the patient's chart and labs.  Questions were answered to the patient's satisfaction.     Loel Lofty Remmi Armenteros

## 2022-03-07 NOTE — Transfer of Care (Signed)
Immediate Anesthesia Transfer of Care Note  Patient: Jovonna Nickell  Procedure(s) Performed: PANNICULECTOMY WITH LIPOSUCTION (Abdomen)  Patient Location: PACU  Anesthesia Type:General  Level of Consciousness: drowsy and patient cooperative  Airway & Oxygen Therapy: Patient Spontanous Breathing and Patient connected to nasal cannula oxygen  Post-op Assessment: Report given to RN, Post -op Vital signs reviewed and stable and Patient moving all extremities X 4  Post vital signs: Reviewed and stable  Last Vitals:  Vitals Value Taken Time  BP 147/80 03/07/22 1003  Temp    Pulse 92 03/07/22 1006  Resp 24 03/07/22 1006  SpO2 98 % 03/07/22 1006  Vitals shown include unvalidated device data.  Last Pain:  Vitals:   03/07/22 0659  TempSrc:   PainSc: 1       Patients Stated Pain Goal: 1 (42/70/62 3762)  Complications: No notable events documented.

## 2022-03-07 NOTE — Anesthesia Procedure Notes (Signed)
Procedure Name: Intubation Date/Time: 03/07/2022 7:38 AM  Performed by: Darletta Moll, CRNAPre-anesthesia Checklist: Patient identified, Emergency Drugs available, Suction available and Patient being monitored Patient Re-evaluated:Patient Re-evaluated prior to induction Oxygen Delivery Method: Circle system utilized Preoxygenation: Pre-oxygenation with 100% oxygen Induction Type: IV induction Ventilation: Mask ventilation without difficulty Laryngoscope Size: Mac and 3 Grade View: Grade I Tube type: Oral Tube size: 7.0 mm Number of attempts: 1 Airway Equipment and Method: Stylet and Oral airway Placement Confirmation: ETT inserted through vocal cords under direct vision, positive ETCO2 and breath sounds checked- equal and bilateral Secured at: 21 cm Tube secured with: Tape Dental Injury: Teeth and Oropharynx as per pre-operative assessment

## 2022-03-08 ENCOUNTER — Encounter (HOSPITAL_COMMUNITY): Payer: Self-pay | Admitting: Plastic Surgery

## 2022-03-08 DIAGNOSIS — N62 Hypertrophy of breast: Secondary | ICD-10-CM | POA: Diagnosis present

## 2022-03-08 DIAGNOSIS — Y9301 Activity, walking, marching and hiking: Secondary | ICD-10-CM | POA: Diagnosis not present

## 2022-03-08 DIAGNOSIS — R55 Syncope and collapse: Secondary | ICD-10-CM | POA: Diagnosis not present

## 2022-03-08 DIAGNOSIS — I959 Hypotension, unspecified: Secondary | ICD-10-CM | POA: Diagnosis not present

## 2022-03-08 DIAGNOSIS — G8929 Other chronic pain: Secondary | ICD-10-CM | POA: Diagnosis present

## 2022-03-08 DIAGNOSIS — K219 Gastro-esophageal reflux disease without esophagitis: Secondary | ICD-10-CM | POA: Diagnosis present

## 2022-03-08 DIAGNOSIS — N184 Chronic kidney disease, stage 4 (severe): Secondary | ICD-10-CM | POA: Diagnosis present

## 2022-03-08 DIAGNOSIS — M546 Pain in thoracic spine: Secondary | ICD-10-CM | POA: Diagnosis present

## 2022-03-08 DIAGNOSIS — G43909 Migraine, unspecified, not intractable, without status migrainosus: Secondary | ICD-10-CM | POA: Diagnosis present

## 2022-03-08 DIAGNOSIS — E039 Hypothyroidism, unspecified: Secondary | ICD-10-CM | POA: Diagnosis present

## 2022-03-08 DIAGNOSIS — N179 Acute kidney failure, unspecified: Secondary | ICD-10-CM | POA: Diagnosis present

## 2022-03-08 DIAGNOSIS — D62 Acute posthemorrhagic anemia: Secondary | ICD-10-CM | POA: Diagnosis present

## 2022-03-08 DIAGNOSIS — M793 Panniculitis, unspecified: Secondary | ICD-10-CM | POA: Diagnosis present

## 2022-03-08 DIAGNOSIS — Z9884 Bariatric surgery status: Secondary | ICD-10-CM | POA: Diagnosis not present

## 2022-03-08 DIAGNOSIS — I5022 Chronic systolic (congestive) heart failure: Secondary | ICD-10-CM | POA: Diagnosis present

## 2022-03-08 DIAGNOSIS — Z9889 Other specified postprocedural states: Secondary | ICD-10-CM | POA: Diagnosis not present

## 2022-03-08 DIAGNOSIS — F32A Depression, unspecified: Secondary | ICD-10-CM | POA: Diagnosis present

## 2022-03-08 DIAGNOSIS — D631 Anemia in chronic kidney disease: Secondary | ICD-10-CM | POA: Diagnosis present

## 2022-03-08 DIAGNOSIS — Z6841 Body Mass Index (BMI) 40.0 and over, adult: Secondary | ICD-10-CM | POA: Diagnosis not present

## 2022-03-08 DIAGNOSIS — I2510 Atherosclerotic heart disease of native coronary artery without angina pectoris: Secondary | ICD-10-CM | POA: Diagnosis present

## 2022-03-08 DIAGNOSIS — Y92230 Patient room in hospital as the place of occurrence of the external cause: Secondary | ICD-10-CM | POA: Diagnosis not present

## 2022-03-08 DIAGNOSIS — N051 Unspecified nephritic syndrome with focal and segmental glomerular lesions: Secondary | ICD-10-CM | POA: Diagnosis present

## 2022-03-08 DIAGNOSIS — I130 Hypertensive heart and chronic kidney disease with heart failure and stage 1 through stage 4 chronic kidney disease, or unspecified chronic kidney disease: Secondary | ICD-10-CM | POA: Diagnosis present

## 2022-03-08 DIAGNOSIS — W1830XA Fall on same level, unspecified, initial encounter: Secondary | ICD-10-CM | POA: Diagnosis not present

## 2022-03-08 DIAGNOSIS — Z8249 Family history of ischemic heart disease and other diseases of the circulatory system: Secondary | ICD-10-CM | POA: Diagnosis not present

## 2022-03-08 DIAGNOSIS — M1030 Gout due to renal impairment, unspecified site: Secondary | ICD-10-CM | POA: Diagnosis present

## 2022-03-08 DIAGNOSIS — J45909 Unspecified asthma, uncomplicated: Secondary | ICD-10-CM | POA: Diagnosis present

## 2022-03-08 DIAGNOSIS — F39 Unspecified mood [affective] disorder: Secondary | ICD-10-CM | POA: Diagnosis present

## 2022-03-08 LAB — CBC
HCT: 27.8 % — ABNORMAL LOW (ref 36.0–46.0)
HCT: 27.9 % — ABNORMAL LOW (ref 36.0–46.0)
Hemoglobin: 8.6 g/dL — ABNORMAL LOW (ref 12.0–15.0)
Hemoglobin: 8.9 g/dL — ABNORMAL LOW (ref 12.0–15.0)
MCH: 29.7 pg (ref 26.0–34.0)
MCH: 30.2 pg (ref 26.0–34.0)
MCHC: 30.9 g/dL (ref 30.0–36.0)
MCHC: 31.9 g/dL (ref 30.0–36.0)
MCV: 95.9 fL (ref 80.0–100.0)
MCV: 94.6 fL (ref 80.0–100.0)
Platelets: 197 10*3/uL (ref 150–400)
Platelets: 171 10*3/uL (ref 150–400)
RBC: 2.9 MIL/uL — ABNORMAL LOW (ref 3.87–5.11)
RBC: 2.95 MIL/uL — ABNORMAL LOW (ref 3.87–5.11)
RDW: 15.7 % — ABNORMAL HIGH (ref 11.5–15.5)
RDW: 15.5 % (ref 11.5–15.5)
WBC: 8.1 10*3/uL (ref 4.0–10.5)
WBC: 8.9 10*3/uL (ref 4.0–10.5)
nRBC: 0 % (ref 0.0–0.2)
nRBC: 0 % (ref 0.0–0.2)

## 2022-03-08 LAB — BASIC METABOLIC PANEL
Anion gap: 7 (ref 5–15)
BUN: 30 mg/dL — ABNORMAL HIGH (ref 6–20)
CO2: 22 mmol/L (ref 22–32)
Calcium: 7.8 mg/dL — ABNORMAL LOW (ref 8.9–10.3)
Chloride: 108 mmol/L (ref 98–111)
Creatinine, Ser: 3.56 mg/dL — ABNORMAL HIGH (ref 0.44–1.00)
GFR, Estimated: 15 mL/min — ABNORMAL LOW (ref >60)
Glucose, Bld: 118 mg/dL — ABNORMAL HIGH (ref 70–99)
Potassium: 4.8 mmol/L (ref 3.5–5.1)
Sodium: 137 mmol/L (ref 135–145)

## 2022-03-08 LAB — HIV ANTIBODY (ROUTINE TESTING W REFLEX): HIV Screen 4th Generation wRfx: NONREACTIVE

## 2022-03-08 LAB — GLUCOSE, CAPILLARY: Glucose-Capillary: 119 mg/dL — ABNORMAL HIGH (ref 70–99)

## 2022-03-08 MED ORDER — FLUOXETINE HCL 10 MG PO CAPS
10.0000 mg | ORAL_CAPSULE | Freq: Every day | ORAL | Status: DC
  Administered 2022-03-08 – 2022-03-11 (×4): 10 mg via ORAL
  Filled 2022-03-08 (×5): qty 1

## 2022-03-08 MED ORDER — PANTOPRAZOLE SODIUM 40 MG PO TBEC
40.0000 mg | DELAYED_RELEASE_TABLET | Freq: Two times a day (BID) | ORAL | Status: DC
  Administered 2022-03-08 – 2022-03-11 (×6): 40 mg via ORAL
  Filled 2022-03-08 (×6): qty 1

## 2022-03-08 MED ORDER — NIFEDIPINE ER OSMOTIC RELEASE 60 MG PO TB24
60.0000 mg | ORAL_TABLET | Freq: Two times a day (BID) | ORAL | Status: DC
Start: 2022-03-08 — End: 2022-03-08

## 2022-03-08 MED ORDER — LACTATED RINGERS IV SOLN: INTRAVENOUS | Status: DC

## 2022-03-08 MED ORDER — HYDRALAZINE HCL 50 MG PO TABS
50.0000 mg | ORAL_TABLET | Freq: Three times a day (TID) | ORAL | Status: DC
  Administered 2022-03-08 – 2022-03-11 (×8): 50 mg via ORAL
  Filled 2022-03-08 (×8): qty 1

## 2022-03-08 MED ORDER — NIFEDIPINE ER OSMOTIC RELEASE 60 MG PO TB24
60.0000 mg | ORAL_TABLET | Freq: Two times a day (BID) | ORAL | Status: DC
  Administered 2022-03-09 – 2022-03-11 (×5): 60 mg via ORAL
  Filled 2022-03-08 (×6): qty 1

## 2022-03-08 MED ORDER — LEVOTHYROXINE SODIUM 25 MCG PO TABS
125.0000 ug | ORAL_TABLET | Freq: Every day | ORAL | Status: DC
  Administered 2022-03-09 – 2022-03-11 (×3): 125 ug via ORAL
  Filled 2022-03-08 (×3): qty 1

## 2022-03-08 NOTE — Assessment & Plan Note (Signed)
-  Between post-operative blood (which was heavy but now is decreasing) and menstrual flow, the patient dropped her Hgb significantly -Her transfusion goal is <7 but if she has any further episodes with syncope/near syncope would go ahead and transfuse regardless of further drop -Based on the clinical stability of the patient at the time of my evaluation, would hold transfusion for now unless she has further issues or drops <7

## 2022-03-08 NOTE — Assessment & Plan Note (Signed)
-  She has advanced renal disease at baseline -Suspect that she had AKI associated with ABLA -She is being given gentle IVF hydration -Will recheck BMP in AM -Needs referral to nephrology as an outpatient, as she is at high risk for needing HD eventually

## 2022-03-08 NOTE — Progress Notes (Signed)
1 Day Post-Op  Subjective: Patient is a 46 year old female status post panniculectomy with liposuction yesterday at [facility] with Dr. Marla Roe.  Patient stayed overnight for observation. Per nursing staff, overnight patient had 2 episodes of dizziness and low BP.  Nursing staff called RRT for second opinion at 3 AM.  Thought to be related to patient's anxiousness, continued monitoring per RN note.  I personally received a call 6 AM today noting patient's dizziness and nursing staff reported that patient fell while walking to bathroom.  Patient and nursing staff do not note any injuries.  Deny any injuries.  Patient is resting in bed on evaluation this a.m., reports overall she feels okay.  She does not report any cardiac or pulmonary symptoms.  She does report that she feels weak, but feels fine when resting in bed.  Addendum: Saw patient again at 11 AM, discussed with patient her hemoglobin has dropped.  Since evaluation this morning she has had 50 cc of sanguinous output from the right JP drain and 30 cc of sanguinous output from the left drain.  Patient reports she has not been up and walking since her fall earlier this a.m.  She denies any changes in her symptoms, still feels weak, denies any cardiac or pulmonary symptoms.  Objective: Vital signs in last 24 hours: Temp:  [97.5 F (36.4 C)-99.3 F (37.4 C)] 97.9 F (36.6 C) (08/08 4098) Pulse Rate:  [52-85] 58 (08/08 0632) Resp:  [11-20] 20 (08/08 1191) BP: (107-196)/(56-86) 107/58 (08/08 0632) SpO2:  [97 %-100 %] 99 % (08/08 4782)    Intake/Output from previous day: 08/07 0701 - 08/08 0700 In: 1589.6 [I.V.:1289.6; IV Piggyback:300] Out: 1012 [Urine:175; Drains:737; Blood:100] Intake/Output this shift: No intake/output data recorded.  General appearance: alert, cooperative, no distress, and resting in bed, RN at bedside. Head: Normocephalic, without obvious abnormality, atraumatic GI: Abdominal dressing intact,  bilateral JP drains with sanguinous fluid in bulbs.  Mild tenderness with palpation of right lower abdomen, reports increased tenderness with palpation over left lower abdomen.  No erythema or cellulitic changes noted.  Mild swelling noted in the pubic area, no significant fluid collection noted with palpation of abdomen. Lower extremity: Some minor LE swelling at this time. SCDs not in place.  Lab Results:     Latest Ref Rng & Units 03/01/2022    1:40 PM 08/22/2020    2:59 AM 08/21/2020    2:14 AM  CBC  WBC 4.0 - 10.5 K/uL 6.3  4.6  3.6   Hemoglobin 12.0 - 15.0 g/dL 12.5  11.6  11.8   Hematocrit 36.0 - 46.0 % 39.4  35.9  39.4   Platelets 150 - 400 K/uL 243  161  154     BMET No results for input(s): "NA", "K", "CL", "CO2", "GLUCOSE", "BUN", "CREATININE", "CALCIUM" in the last 72 hours. PT/INR No results for input(s): "LABPROT", "INR" in the last 72 hours. ABG No results for input(s): "PHART", "HCO3" in the last 72 hours.  Invalid input(s): "PCO2", "PO2"  Studies/Results: No results found.  Anti-infectives: Anti-infectives (From admission, onward)    Start     Dose/Rate Route Frequency Ordered Stop   03/07/22 1545  ciprofloxacin (CIPRO) IVPB 400 mg        400 mg 200 mL/hr over 60 Minutes Intravenous Every 12 hours 03/07/22 1448 03/15/22 0959   03/07/22 0645  ceFAZolin (ANCEF) IVPB 3g/100 mL premix        3 g 200 mL/hr over 30 Minutes Intravenous On  call to O.R. 03/07/22 2831 03/07/22 0804       Assessment/Plan: s/p Procedure(s): PANNICULECTOMY WITH LIPOSUCTION  POD61: 46 year old female status post panniculectomy yesterday.  Patient has had approximately 760 cc of sanguinous output from JP drains per discussion with nursing this a.m. and after reviewing intake/output.   Discussed patient's case with Dr. Marla Roe this a.m., recommended consult to hospitalist medicine for evaluation of her low blood pressure.  Continue with IV fluids.  Awaiting CBC and BMP.  We will  continue to monitor, discussed with patient I would evaluate her in a few hours and will review labs as they become available.   - DVT prophylaxis with SCDs, will hold off on Lovenox at this time given drain output. Patient reports that SCDs have not been on, will place additional order.  She reports they were previously on but not functioning. - FEN: Normal diet. -Stat consult placed to inpatient medicine this a.m.  Addendum at 11AM: Patient reevaluated this a.m. at 11, hemoglobin down to 8.6 from 12.5.  Hematocrit 27.8.  Creatinine elevated to 3.56 elevated from baseline of approximately 2.4-2.6.  Patient's case discussed with Dr. Marla Roe after reevaluating patient: - Continue to monitor patient, will plan for overnight stay. - Recommended ambulating - Plan to re-evaluate this afternoon.   Will directly call inpatient medicine service at this time for consultation related to medical management due to significantly elevated creatinine.   Will plan to re-check H&H this afternoon and re-evaluate patient, may need transfusion.    LOS: 0 days    Charlies Constable, PA-C 03/08/2022

## 2022-03-08 NOTE — Assessment & Plan Note (Signed)
-  Body mass index is 41.97 kg/m..  -Weight loss should be encouraged -Outpatient PCP/bariatric medicine/bariatric surgery f/u encouraged

## 2022-03-08 NOTE — Progress Notes (Signed)
Blood consent signed per order and placed in patients drawer outside of her room.

## 2022-03-08 NOTE — Assessment & Plan Note (Signed)
-  Resume hydralazine tonight, nifedipine in AM -Hold spironolactone until outpatient f/u

## 2022-03-08 NOTE — Progress Notes (Signed)
1 Day Post-Op  Subjective: Patient 1 day postop from pending to me, reevaluated this afternoon due to concern for bleeding from bilateral JP drains.  Hospitalist team has evaluated patient, appreciate their help.  Patient reports she is feeling a little bit better, reports she has been up to bedside commode and bedside chair.  She does not report any new symptoms.  RN is at bedside. Patient is aware she may need blood transfusion pending her afternoon CBC.  Objective: Vital signs in last 24 hours: Temp:  [97.7 F (36.5 C)-99.3 F (37.4 C)] 98.4 F (36.9 C) (08/08 1452) Pulse Rate:  [50-61] 53 (08/08 1452) Resp:  [16-20] 17 (08/08 1452) BP: (101-177)/(56-77) 124/60 (08/08 1452) SpO2:  [96 %-100 %] 99 % (08/08 1452)    Intake/Output from previous day: 08/07 0701 - 08/08 0700 In: 1589.6 [I.V.:1289.6; IV Piggyback:300] Out: 1012 [Urine:175; Drains:737; Blood:100] Intake/Output this shift: Total I/O In: 120 [P.O.:120] Out: 385 [Drains:385]  General appearance: alert, cooperative, no distress, and resting in bed, RN at bedside, appears tired Head: Normocephalic, without obvious abnormality, atraumatic Resp: Unlabored GI: Soft, tender to palpation, worse on the right lateral side.  No ecchymosis is noted.  Dressings are intact.  Difficult to evaluate for fluid collection due to body habitus.  No erythema or cellulitic changes.  Bilateral JP drains in place, right drain with sanguinous 110 cc in bulb, left drain with 70 cc of sanguinous drainage in bulb.  Abdominal binder present. Extremities: Some minor lower extremity swelling is noted, SCDs not in place.   Lab Results:     Latest Ref Rng & Units 03/08/2022    8:11 AM 03/01/2022    1:40 PM 08/22/2020    2:59 AM  CBC  WBC 4.0 - 10.5 K/uL 8.1  6.3  4.6   Hemoglobin 12.0 - 15.0 g/dL 8.6  12.5  11.6   Hematocrit 36.0 - 46.0 % 27.8  39.4  35.9   Platelets 150 - 400 K/uL 197  243  161     BMET Recent Labs    03/08/22 0811  NA 137   K 4.8  CL 108  CO2 22  GLUCOSE 118*  BUN 30*  CREATININE 3.56*  CALCIUM 7.8*   PT/INR No results for input(s): "LABPROT", "INR" in the last 72 hours. ABG No results for input(s): "PHART", "HCO3" in the last 72 hours.  Invalid input(s): "PCO2", "PO2"  Studies/Results: No results found.  Anti-infectives: Anti-infectives (From admission, onward)    Start     Dose/Rate Route Frequency Ordered Stop   03/07/22 1545  ciprofloxacin (CIPRO) IVPB 400 mg        400 mg 200 mL/hr over 60 Minutes Intravenous Every 12 hours 03/07/22 1448 03/15/22 0959   03/07/22 0645  ceFAZolin (ANCEF) IVPB 3g/100 mL premix        3 g 200 mL/hr over 30 Minutes Intravenous On call to O.R. 03/07/22 0240 03/07/22 0804       Assessment/Plan: s/p Procedure(s): PANNICULECTOMY WITH LIPOSUCTION  Plan for patient to stay overnight, will continue to monitor. CBC will be rechecked at 5 PM.  Appreciate medicine team for their help.  DVT prophylaxis with SCDs, discussed need for SCDs with nursing staff. FEN: Normal diet  Discussed patient with Dr. Marla Roe, we will plan to reevaluate in the morning.  Patient and nursing staff aware that we can be reached at any time with questions or concerns.    LOS: 0 days    Charlies Constable, PA-C 03/08/2022

## 2022-03-08 NOTE — Assessment & Plan Note (Signed)
-  Patient did well on POD #0 but had a syncopal event today -Very likely associated with equilibration from her blood loss anemia/AKI -She was back to baseline at the time of my evaluation -Will continue to monitor for an additional night without plan for further intervention unless she further decompensates

## 2022-03-08 NOTE — Consult Note (Signed)
Initial Consultation Note   Patient: Dawn Thomas SPQ:[id number] DOB: 1976-07-15 PCP: Virginia Rochester, [place] DOA: 03/07/2022 DOS: the patient was seen and examined on 03/08/2022 Primary service: Wallace Going, DO  Referring physician: [place] Reason for consult: Panniculectomy yesterday.  This AM with low BPs, probable bleed.  Hgb 12.5 -> 8.6, slowing down?  CKD jumped from 2.4-2.6 -> 3.6.  Needs medical management.  Following Hgb.  May go back to OR later today.    Assessment and Plan: * S/P panniculectomy -Patient was admitted by plastic surgery for panniculectomy -She remains on Cipro -She was planned for dc today but had worsening anemia, worsening renal function, syncopal event (see below) -Management of this issue is by plastic surgery  Syncope -Patient did well on POD #0 but had a syncopal event today -Very likely associated with equilibration from her blood loss anemia/AKI -She was back to baseline at the time of my evaluation -Will continue to monitor for an additional night without plan for further intervention unless she further decompensates  ABLA (acute blood loss anemia) -Between post-operative blood (which was heavy but now is decreasing) and menstrual flow, the patient dropped her Hgb significantly -Her transfusion goal is <7 but if she has any further episodes with syncope/near syncope would go ahead and transfuse regardless of further drop -Based on the clinical stability of the patient at the time of my evaluation, would hold transfusion for now unless she has further issues or drops <7  Acute kidney injury 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  -Weight loss should be encouraged -Outpatient PCP/bariatric medicine/bariatric surgery f/u encouraged       TRH will continue to follow the patient.  HPI: Dawn Thomas is a 46 y.o. female with past medical history of HTN, hypothyroidism, and morbid obesity who presented for panniculectomy.  She reports that she was having terrible back pain and so she decided to proceed with surgery. She did well yesterday and was up and about.  Overnight, she awoke with a sensation like a panic attack - SOB, diaphoresis.  The symptoms eventually passed and she went back to sleep.  She had one further recurrence.  This AM, she got up to go to the bathroom and became acutely dizzy and had a syncopal event. She was having significant bloody drainage from her JP drains - yesterday on the left and today on the right.  She also has her menses at this time.  At the time of my evaluation, the patient was significantly better and was sitting up in the bedside chair looking great.  Review of Systems: As mentioned in the history of present illness. All other systems reviewed and are negative. 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   Renal disorder  stage 3- Dr [facility] - Dr Olivia Mackie   Renal insufficiency    Sleep apnea    mild    Tendonitis    Thyroid disease    Past Surgical History:  Procedure Laterality Date   CESAREAN SECTION     CHOLECYSTECTOMY     DILATION AND CURETTAGE OF UTERUS     LAPAROSCOPIC GASTRIC SLEEVE RESECTION N/A 05/30/2017   Procedure: LAPAROSCOPIC GASTRIC  SLEEVE RESECTION WITH UPPPER ENDO AND HIATAL HERNIA REPAIR;  Surgeon: Kieth Brightly Arta Bruce, MD;  Location: [facility];  Service: General;  Laterality: N/A;   PANNICULECTOMY N/A 03/07/2022   Procedure: PANNICULECTOMY WITH LIPOSUCTION;  Surgeon: Wallace Going, DO;  Location: [facility];  Service: Plastics;  Laterality: N/A;  4 hours   Social History:  reports that she has never smoked. She has never used smokeless tobacco. She reports that she does not drink alcohol and does not use drugs.  Allergies  Allergen Reactions   Maxalt [Rizatriptan Benzoate] Shortness Of Breath   Zithromax [Azithromycin] Other (See Comments)    "messes with my breathing"   Adhesive [Tape] Other (See Comments)    Burns skin   Hydrocodone Hives   Nsaids Other (See Comments)    Renal insufficiency.   Omeprazole Hives   Penicillins Hives    Family History  Problem Relation Age of Onset   Diabetes Mother    Hypertension Mother    Hypertension Father    Diabetes Father    Sudden death Father    Heart attack Neg Hx    Hyperlipidemia Neg Hx     Prior to Admission medications   Medication Sig Start Date End Date Taking? Authorizing Provider  enoxaparin (LOVENOX) 40 MG/0.4ML injection Inject 0.4 mLs (40 mg total) into the skin daily for 7 days. Start 24 hours after surgery on 03/07/22. Recommend starting mid-day on 03/08/22. 03/07/22 03/14/22 Yes Scheeler, Carola Rhine, PA-C  ergocalciferol (VITAMIN D2) 1.25 MG (50000 UT) capsule Take 50,000 Units by mouth once a week.   Yes [provider]  FLUoxetine (PROZAC) 10 MG capsule Take 10 mg by mouth daily. 02/13/22  Yes [provider]  folic acid (FOLVITE) 1 MG tablet Take 1 mg by mouth daily. 12/07/21  Yes [provider]  hydrALAZINE (APRESOLINE) 50 MG tablet Take 1 tablet (50 mg total) by mouth every 8 (eight) hours. 09/17/20 03/07/22 Yes Chandrasekhar, Mahesh A, MD  levothyroxine (SYNTHROID) 125 MCG tablet Take 125 mcg by mouth daily.   Yes [provider]  NIFEdipine (ADALAT CC) 60 MG 24 hr tablet Take 60 mg by mouth 2 (two) times daily. 07/24/20  Yes [provider]  pantoprazole (PROTONIX) 40 MG tablet Take 40 mg by mouth 2 (two) times daily. 04/27/20  Yes [provider]  spironolactone (ALDACTONE) 25 MG tablet Take 25 mg by mouth daily.   Yes [provider]  acetaminophen (TYLENOL) 500 MG tablet Take 1,000 mg by mouth every 6 (six) hours as needed for mild pain.    [provider]  albuterol (PROVENTIL HFA;VENTOLIN HFA) 108 (90 BASE) MCG/ACT inhaler Inhale 2 puffs into the lungs every 6 (six) hours as needed for wheezing or shortness of breath.     [provider]  Cyanocobalamin (B-12 PO) Take 1 tablet by mouth daily.    [provider]  ondansetron (ZOFRAN) 4 MG tablet Take 1 tablet (4 mg total) by mouth every 8 (eight) hours as needed for nausea or vomiting. 02/10/22   Scheeler, Rodman Key  J, PA-C    Physical Exam: Vitals:   03/08/22 7543 03/08/22 0909 03/08/22 1159 03/08/22 1452  BP: (!) 107/58 (!) 101/56 125/61 124/60  Pulse: (!) 58 (!) 50 61 (!) 53  Resp: 20 16 18 17   Temp: 97.9 F (36.6 C) 97.8 F (36.6 C) 97.7 F (36.5 C) 98.4 F (36.9 C)  TempSrc: Oral Oral Oral Oral  SpO2: 99% 96% 100% 99%  Weight:      Height:       General:  Appears calm and comfortable and is in NAD Eyes:  EOMI, normal lids, iris ENT:  grossly normal hearing, lips & tongue, mmm Neck:  no LAD, masses or thyromegaly Cardiovascular:  RRR, no m/r/g. 1+ LE edema.  Respiratory:   CTA bilaterally with no wheezes/rales/rhonchi.  Normal respiratory effort. Abdomen:  soft, appropriately tender with B drains in place as well as dressings and abdominal binder Skin:  no rash or induration seen on limited exam Musculoskeletal:  grossly normal tone BUE/BLE, good ROM, no bony abnormality Psychiatric:  grossly normal mood and affect, speech fluent and appropriate, AOx3 Neurologic:  CN 2-12 grossly  intact, moves all extremities in coordinated fashion   Radiological Exams on Admission: Independently reviewed - see discussion in A/P where applicable  No results found.  EKG: not done   Labs on Admission: I have personally reviewed the available labs and imaging studies at the time of the admission.  Pertinent labs:    Glucose 118 BUN 30/Creatinine 3.56/GFR 15; 21/2.42/24 on 8/1 WBC 8.1 Hgb 8.6; 12.5 on 8/1  Family Communication: None present; she is capable of communicating with family at this time  Primary team communication: I spoke with the plastic surgery PA at the time of the consult and with both the PA and Dr. Marla Roe via [place] after the consult  Thank you very much for involving [place] in the care of your patient.  Author: Karmen Bongo, MD 03/08/2022 6:49 PM  For on call review [email or web address].

## 2022-03-08 NOTE — Assessment & Plan Note (Signed)
-  Patient was admitted by plastic surgery for panniculectomy -She remains on Cipro -She was planned for dc today but had worsening anemia, worsening renal function, syncopal event (see below) -Management of this issue is by plastic surgery

## 2022-03-08 NOTE — Assessment & Plan Note (Signed)
Continue fluoxetine

## 2022-03-08 NOTE — Progress Notes (Signed)
   03/08/22 4128  What Happened  Was fall witnessed? Yes  Who witnessed fall? The nurse Aide  Patients activity before fall bathroom-assisted  Point of contact other (comment) (none)  Was patient injured? No  Follow Up  MD notified the on call PA for plastic surgeon  Time MD notified [phone]  Family notified  (no)  Additional tests Yes-comment  Simple treatment  (cbc)  Progress note created (see row info) Yes  Adult Fall Risk Assessment  Risk Factor Category (scoring not indicated) High fall risk per protocol (document High fall risk)  Patient Fall Risk Level High fall risk  Adult Fall Risk Interventions  Required Bundle Interventions *See Row Information* High fall risk - low, moderate, and high requirements implemented  Additional Interventions Other (Comment);PT/OT need assessed if change in mobility from baseline  Screening for Fall Injury Risk (To be completed on HIGH fall risk patients) - Assessing Need for Floor Mats  Risk For Fall Injury- Criteria for Floor Mats None identified - No additional interventions needed  Vitals  Temp 97.9 F (36.6 C)  Temp Source Oral  BP (!) 107/58  MAP (mmHg) 72  BP Location Right Arm  BP Method Automatic  Patient Position (if appropriate) Lying  Pulse Rate (!) 58  Pulse Rate Source Monitor  Resp 20  Oxygen Therapy  SpO2 99 %  O2 Device Room Air

## 2022-03-08 NOTE — Progress Notes (Signed)
Patient experienced episode of dizziness with a drop in BP. With the extent of the drop in BP, RN called RRT for second opinion. Patient stated that woke up very anxious and thinks that might have caused her blood pressure. Patient reassured and made comfortable in bed. Will keep monitoring.

## 2022-03-08 NOTE — Assessment & Plan Note (Signed)
Continue Synthroid °

## 2022-03-09 ENCOUNTER — Inpatient Hospital Stay (HOSPITAL_COMMUNITY): Payer: Medicaid Other

## 2022-03-09 DIAGNOSIS — Z9889 Other specified postprocedural states: Secondary | ICD-10-CM | POA: Diagnosis not present

## 2022-03-09 LAB — CBC
HCT: 22.9 % — ABNORMAL LOW (ref 36.0–46.0)
Hemoglobin: 7.1 g/dL — ABNORMAL LOW (ref 12.0–15.0)
MCH: 29.8 pg (ref 26.0–34.0)
MCHC: 31 g/dL (ref 30.0–36.0)
MCV: 96.2 fL (ref 80.0–100.0)
Platelets: 154 10*3/uL (ref 150–400)
RBC: 2.38 MIL/uL — ABNORMAL LOW (ref 3.87–5.11)
RDW: 15.7 % — ABNORMAL HIGH (ref 11.5–15.5)
WBC: 7.5 10*3/uL (ref 4.0–10.5)
nRBC: 0 % (ref 0.0–0.2)

## 2022-03-09 LAB — HEMOGLOBIN AND HEMATOCRIT, BLOOD
HCT: 27.3 % — ABNORMAL LOW (ref 36.0–46.0)
Hemoglobin: 8.7 g/dL — ABNORMAL LOW (ref 12.0–15.0)

## 2022-03-09 LAB — URINALYSIS, ROUTINE W REFLEX MICROSCOPIC
Bilirubin Urine: NEGATIVE
Glucose, UA: NEGATIVE mg/dL
Ketones, ur: NEGATIVE mg/dL
Leukocytes,Ua: NEGATIVE
Nitrite: NEGATIVE
Protein, ur: 100 mg/dL — AB
RBC / HPF: >50 RBC/hpf — ABNORMAL HIGH (ref 0–5)
Specific Gravity, Urine: 1.005 (ref 1.005–1.030)
pH: 5 (ref 5.0–8.0)

## 2022-03-09 LAB — BASIC METABOLIC PANEL
Anion gap: 6 (ref 5–15)
Anion gap: 7 (ref 5–15)
BUN: 35 mg/dL — ABNORMAL HIGH (ref 6–20)
BUN: 38 mg/dL — ABNORMAL HIGH (ref 6–20)
CO2: 22 mmol/L (ref 22–32)
CO2: 20 mmol/L — ABNORMAL LOW (ref 22–32)
Calcium: 7.6 mg/dL — ABNORMAL LOW (ref 8.9–10.3)
Calcium: 7.8 mg/dL — ABNORMAL LOW (ref 8.9–10.3)
Chloride: 109 mmol/L (ref 98–111)
Chloride: 111 mmol/L (ref 98–111)
Creatinine, Ser: 4.35 mg/dL — ABNORMAL HIGH (ref 0.44–1.00)
Creatinine, Ser: 4.03 mg/dL — ABNORMAL HIGH (ref 0.44–1.00)
GFR, Estimated: 12 mL/min — ABNORMAL LOW (ref >60)
GFR, Estimated: 13 mL/min — ABNORMAL LOW (ref >60)
Glucose, Bld: 99 mg/dL (ref 70–99)
Glucose, Bld: 110 mg/dL — ABNORMAL HIGH (ref 70–99)
Potassium: 4.1 mmol/L (ref 3.5–5.1)
Potassium: 4.2 mmol/L (ref 3.5–5.1)
Sodium: 137 mmol/L (ref 135–145)
Sodium: 138 mmol/L (ref 135–145)

## 2022-03-09 LAB — SODIUM, URINE, RANDOM: Sodium, Ur: 19 mmol/L

## 2022-03-09 LAB — CREATININE, URINE, RANDOM: Creatinine, Urine: 61 mg/dL

## 2022-03-09 LAB — PREPARE RBC (CROSSMATCH)

## 2022-03-09 MED ORDER — CIPROFLOXACIN IN D5W 400 MG/200ML IV SOLN
400.0000 mg | Freq: Every day | INTRAVENOUS | Status: DC
  Administered 2022-03-10: 400 mg via INTRAVENOUS
  Filled 2022-03-09 (×2): qty 200

## 2022-03-09 MED ORDER — SIMETHICONE 80 MG PO CHEW
80.0000 mg | CHEWABLE_TABLET | Freq: Four times a day (QID) | ORAL | Status: DC | PRN
Start: 2022-03-09 — End: 2022-03-11

## 2022-03-09 MED ORDER — SODIUM CHLORIDE 0.9% IV SOLUTION: Freq: Once | INTRAVENOUS | Status: AC

## 2022-03-09 NOTE — Progress Notes (Signed)
Blood transfusion complete. H&H order placed

## 2022-03-09 NOTE — Progress Notes (Signed)
Mobility Specialist - Progress Note   03/09/22 1000  Mobility  Activity Ambulated with assistance to bathroom  Level of Assistance Minimal assist, patient does 75% or more  Assistive Device Front wheel [place]  Distance Ambulated (ft) 10 ft  Activity Response Tolerated well  $Mobility charge 1 Mobility   Pt received in bed and agreeable to mobility. C/o pain. No rating. Pt to restroom after session with all needs met. And PT in room.   Roderick Pee Mobility Specialist

## 2022-03-09 NOTE — Progress Notes (Signed)
Called patient to discuss planning. She reports she is currently receiving PRBC, reports she is feeling well. She reports she had ambulated to the bathroom on her own earlier today with no issues. Reports she has walked with PT as well. She reports she is feeling well, no specific complaints.   Discussed with patient we will re-evaluate how she is doing in the AM. All of her questions were answered. Recommend notifying nursing staff if she has any questions for [place].

## 2022-03-09 NOTE — Evaluation (Signed)
Physical Therapy Evaluation Patient Details Name: Dawn Thomas MRN: [id number] DOB: 1976/04/01 Today's Date: 03/09/2022  History of Present Illness  Patient is a 46 year old female status post panniculectomy with liposuction of her abdomen at [facility] with Dr. Marla Roe on 03/07/2022.  She stayed overnight for observation, postoperatively she did develop a questionable bleed and has had significant bloody drainage from her JP drains.  PMH:  HTN, syncope, mood disorder  Clinical Impression  Pt admitted with above diagnosis. Pt able to progress ambulation to hallway with overall good balance. Pt needed RW today but hopes to progress and not need device.  Will follow acutely.  Pt currently with functional limitations due to the deficits listed below (see PT Problem List). Pt will benefit from skilled PT to increase their independence and safety with mobility to allow discharge to the venue listed below.          Recommendations for follow up therapy are one component of a multi-disciplinary discharge planning process, led by the attending physician.  Recommendations may be updated based on patient status, additional functional criteria and insurance authorization.  Follow Up Recommendations Home health PT (may progress and not need HHPT)      Assistance Recommended at Discharge Intermittent Supervision/Assistance  Patient can return home with the following  Assistance with cooking/housework;Assist for transportation;Help with stairs or ramp for entrance    Equipment Recommendations Other (comment) (May need RW but pt doesnt really want one- depends on when pt d/c's)  Recommendations for Other Services       Functional Status Assessment Patient has had a recent decline in their functional status and demonstrates the ability to make significant improvements in function in a reasonable and predictable amount of time.     Precautions / Restrictions Precautions Precautions:  Fall Precaution Comments: 2 JP drains Restrictions Weight Bearing Restrictions: No      Mobility  Bed Mobility               General bed mobility comments: Pt on toilet on arrival with mobility specialist.    Transfers Overall transfer level: Needs assistance Equipment used: Rolling [place] (2 wheels) Transfers: Sit to/from Stand Sit to Stand: Min guard           General transfer comment: min guard to rise from toilet.  Pt changed her maxi pad and cleaned herself.    Ambulation/Gait Ambulation/Gait assistance: Min guard Gait Distance (Feet): 225 Feet Assistive device: Rolling [place] (2 wheels) Gait Pattern/deviations: Step-through pattern, Decreased stride length   Gait velocity interpretation: <1.31 ft/sec, indicative of household ambulator   General Gait Details: Pt was able to ambulate with RW without LOB and with good safety. Pt reports feeling much better today.  Stairs            Wheelchair Mobility    Modified Rankin (Stroke Patients Only)       Balance Overall balance assessment: Needs assistance Sitting-balance support: No upper extremity supported, Feet supported Sitting balance-Leahy Scale: Good     Standing balance support: Bilateral upper extremity supported, During functional activity, Single extremity supported Standing balance-Leahy Scale: Poor Standing balance comment: relies on at least 1 UE support with pt able to wipe herself after toileting with 1 Hand resting on RW for support                             Pertinent Vitals/Pain Pain Assessment Pain Assessment: Faces Faces Pain Scale: Hurts  little more Pain Location: abdomen Pain Descriptors / Indicators: Discomfort, Grimacing, Guarding Pain Intervention(s): Limited activity within patient's tolerance, Monitored during session, Repositioned    Home Living Family/patient expects to be discharged to:: Private residence Living Arrangements: Children;Other  relatives (sister) Available Help at Discharge: Family;Available 24 hours/day Type of Home: House Home Access: Stairs to enter   [employer] of Steps: 1   Home Layout: One level Home Equipment: None      Prior Function Prior Level of Function : Independent/Modified Independent;Driving                     Hand Dominance        Extremity/Trunk Assessment   Upper Extremity Assessment Upper Extremity Assessment: Defer to OT evaluation    Lower Extremity Assessment Lower Extremity Assessment: Overall WFL for tasks assessed    Cervical / Trunk Assessment Cervical / Trunk Assessment: Normal  Communication   Communication: No difficulties  Cognition Arousal/Alertness: Awake/alert Behavior During Therapy: WFL for tasks assessed/performed Overall Cognitive Status: Within Functional Limits for tasks assessed                                          General Comments General comments (skin integrity, edema, etc.): VSS    Exercises     Assessment/Plan    PT Assessment Patient needs continued PT services  PT Problem List Decreased activity tolerance;Decreased balance;Decreased mobility;Decreased knowledge of use of DME;Decreased safety awareness;Decreased knowledge of precautions;Pain;Decreased skin integrity;Obesity       PT Treatment Interventions DME instruction;Gait training;Functional mobility training;Therapeutic activities;Therapeutic exercise;Balance training;Patient/family education;Stair training    PT Goals (Current goals can be found in the Care Plan section)  Acute Rehab PT Goals Patient Stated Goal: to go home PT Goal Formulation: With patient Time For Goal Achievement: 03/23/22 Potential to Achieve Goals: Good    Frequency Min 3X/week     Co-evaluation               AM-PAC PT "6 Clicks" Mobility  Outcome Measure Help needed turning from your back to your side while in a flat bed without using bedrails?: A  Lot Help needed moving from lying on your back to sitting on the side of a flat bed without using bedrails?: A Lot Help needed moving to and from a bed to a chair (including a wheelchair)?: A Little Help needed standing up from a chair using your arms (e.g., wheelchair or bedside chair)?: A Little Help needed to walk in hospital room?: A Little Help needed climbing 3-5 steps with a railing? : A Little 6 Click Score: 16    End of Session Equipment Utilized During Treatment: Gait belt Activity Tolerance: Patient tolerated treatment well Patient left: in chair;with call bell/phone within reach;with chair alarm set Nurse Communication: Mobility status PT Visit Diagnosis: Muscle weakness (generalized) (M62.81)    Time: 9977-4142 PT Time Calculation (min) (ACUTE ONLY): 21 min   Charges:   PT Evaluation $PT Eval Moderate Complexity: 1 Mod          Maat Kafer M,PT Acute Rehab Services [phone]   Alvira Philips 03/09/2022, 12:40 PM

## 2022-03-09 NOTE — Progress Notes (Signed)
Mobility Specialist - Progress Note   03/09/22 1100  Mobility  Activity Transferred from chair to bed  Level of Assistance Minimal assist, patient does 75% or more  Assistive Device None  Distance Ambulated (ft) 4 ft  Activity Response Tolerated well  $Mobility charge 1 Mobility   Pt received in chair and agreeable to mobility. Pt to bed after session with all needs met.   Roderick Pee Mobility Specialist

## 2022-03-09 NOTE — Progress Notes (Signed)
2 Days Post-Op  Subjective: Patient is a 46 year old female status post panniculectomy with liposuction of her abdomen at [facility] with Dr. Marla Roe on 03/07/2022.  She stayed overnight for observation, postoperatively she did develop a questionable bleed and has had significant bloody drainage from her JP drains.  Hospitalist service was consulted for assistance with medical management.   Patient reports today she slept well, reports she feels well, does report ambulating to the bathroom without any issues.  Denies any cardiac or pulmonary symptoms at this time.  She reports she has a new abdominal binder which has been more comfortable, she feels as if the drainage has slowed down.  CBC this a.m. shows drop in hemoglobin to 7.1, creatinine elevated to 4.35 from a baseline of approximately 2.4-2.6.  Objective: Vital signs in last 24 hours: Temp:  [97.7 F (36.5 C)-99.3 F (37.4 C)] 98.6 F (37 C) (08/09 0742) Pulse Rate:  [53-64] 64 (08/09 0742) Resp:  [16-18] 16 (08/09 0742) BP: (124-138)/(54-69) 132/63 (08/09 0742) SpO2:  [99 %-100 %] 100 % (08/09 0742)    Intake/Output from previous day: 08/08 0701 - 08/09 0700 In: 536.8 [P.O.:360; I.V.:176.8] Out: 1245 [Urine:500; Drains:745] Intake/Output this shift: No intake/output data recorded.  General appearance: alert, cooperative, no distress, and sitting up, resting in bed. Head: Normocephalic, without obvious abnormality, atraumatic Resp: Unlabored GI: Soft, mild tenderness to palpation of entire abdomen, improved from yesterday. No ecchymosis noted. Bilateral JP drains in place - drainage appears lighter today than yesterday.  Honeycomb dressings in place without significant drainage.  Umbilicus is viable. Extremities: extremities normal, atraumatic, no cyanosis or edema.  SCDs in place.   Lab Results:     Latest Ref Rng & Units 03/09/2022    6:34 AM 03/08/2022    6:21 PM 03/08/2022    8:11 AM  CBC  WBC 4.0 - 10.5 K/uL  7.5  8.9  8.1   Hemoglobin 12.0 - 15.0 g/dL 7.1  8.9  8.6   Hematocrit 36.0 - 46.0 % 22.9  27.9  27.8   Platelets 150 - 400 K/uL 154  171  197     BMET Recent Labs    03/08/22 0811 03/09/22 0634  NA 137 137  K 4.8 4.1  CL 108 109  CO2 22 22  GLUCOSE 118* 99  BUN 30* 35*  CREATININE 3.56* 4.35*  CALCIUM 7.8* 7.6*   PT/INR No results for input(s): "LABPROT", "INR" in the last 72 hours. ABG No results for input(s): "PHART", "HCO3" in the last 72 hours.  Invalid input(s): "PCO2", "PO2"  Studies/Results: No results found.  Anti-infectives: Anti-infectives (From admission, onward)    Start     Dose/Rate Route Frequency Ordered Stop   03/07/22 1545  ciprofloxacin (CIPRO) IVPB 400 mg        400 mg 200 mL/hr over 60 Minutes Intravenous Every 12 hours 03/07/22 1448 03/15/22 0959   03/07/22 0645  ceFAZolin (ANCEF) IVPB 3g/100 mL premix        3 g 200 mL/hr over 30 Minutes Intravenous On call to O.R. 03/07/22 3474 03/07/22 0804       Assessment/Plan: s/p Procedure(s): PANNICULECTOMY WITH LIPOSUCTION  Status post panniculectomy: Patient evaluated this a.m., discussed with patient her low hemoglobin.  We discussed possible need for transfusion, patient was agreeable to this.  Blood consent previously signed during this admission.  Discussed patient with Dr. Marla Roe and hospitalist medicine team.  Hospitalist team in agreement to transfuse 1 unit of PRBC and  recheck H&H.  Consult to PT for evaluation for eventual discharge planning and ability to safely ambulate at home.  DVT prophylaxis with SCDs, will hold off on any chemoprophylaxis given bleed postoperatively. FEN: Normal diet  Hospitalist team to discuss patient with nephrology.  Appreciate their help.   LOS: 1 day    Charlies Constable, PA-C 03/09/2022

## 2022-03-09 NOTE — Progress Notes (Signed)
PROGRESS NOTE    Dawn Thomas  HQP:[id number] DOB: 08-04-1975 DOA: 03/07/2022 PCP: Virginia Rochester, PA    Brief Narrative:  Panniculectomy yesterday.  This AM with low BPs, probable bleed.  Hgb 12.5 -> 8.6, slowing down?  CKD jumped from 2.4-2.6 -> 3.6.  Needs medical management.  Following Hgb.  May go back to OR later today.  Assessment and Plan: * S/P panniculectomy -Patient was admitted by plastic surgery for panniculectomy -She remains on Cipro -She was planned for dc today but had worsening anemia, worsening renal function, syncopal event (see below) -Management of this issue is by plastic surgery  Syncope -Patient did well on POD #0 but had a syncopal event  -Very likely associated with equilibration from her blood loss anemia/AKI  ABLA (acute blood loss anemia) -Between post-operative blood (which was heavy but now is decreasing) and menstrual flow, the patient dropped her Hgb significantly -Hgb around 7 so will transfuse  Acute kidney injury superimposed on chronic kidney disease ([place]) -She has advanced renal disease at baseline- 2.5 -Suspect that she had AKI associated with ABLA -Needs referral to nephrology as an outpatient, as she is at high risk for needing HD eventually -check urine, urine na/Cr,  -renal U/S does not show hydronephrosis -avoid hypotension -if not improved after PRBC will need renal consult  Mood disorder ([place]) -Continue fluoxetine  Essential hypertension -avoid hypotension -Hold spironolactone until outpatient f/u  Hypothyroidism -Continue Synthroid  Morbid obesity ([place]) -Body mass index is 41.97 kg/m..  -Weight loss should be encouraged -Outpatient PCP/bariatric medicine/bariatric surgery f/u encouraged     DVT prophylaxis: Place and maintain sequential compression device Start: 03/08/22 1117 SCDs Start: 03/07/22 1449    Code Status: Full Code Family Communication:   Disposition Plan:  Level of care: Med-Surg Status is:  Inpatient Remains inpatient appropriate because: Cr increased       Subjective: Feeling better today  Objective: Vitals:   03/08/22 1452 03/08/22 2114 03/09/22 0512 03/09/22 0742  BP: 124/60 (!) 129/54 138/69 132/63  Pulse: (!) 53 63 63 64  Resp: 17 18 18 16   Temp: 98.4 F (36.9 C) 99.3 F (37.4 C) 98.5 F (36.9 C) 98.6 F (37 C)  TempSrc: Oral Oral Oral Oral  SpO2: 99% 100% 100% 100%  Weight:      Height:        Intake/Output Summary (Last 24 hours) at 03/09/2022 1258 Last data filed at 03/09/2022 1034 Gross per 24 hour  Intake 536.76 ml  Output 1040 ml  Net -503.24 ml   Filed Weights   03/07/22 0647  Weight: 119.7 kg    Examination:   General: Appearance:    Severely obese female in no acute distress     Lungs:    respirations unlabored  Heart:    Normal heart rate. Normal rhythm. No murmurs, rubs, or gallops.    MS:   All extremities are intact.    Neurologic:   Awake, alert, oriented x 3. No apparent focal neurological           defect.        Data Reviewed: I have personally reviewed following labs and imaging studies  CBC: Recent Labs  Lab 03/08/22 0811 03/08/22 1821 03/09/22 0634  WBC 8.1 8.9 7.5  HGB 8.6* 8.9* 7.1*  HCT 27.8* 27.9* 22.9*  MCV 95.9 94.6 96.2  PLT 197 171 599   Basic Metabolic Panel: Recent Labs  Lab 03/08/22 0811 03/09/22 0634  NA 137 137  K 4.8 4.1  CL 108 109  CO2 22 22  GLUCOSE 118* 99  BUN 30* 35*  CREATININE 3.56* 4.35*  CALCIUM 7.8* 7.6*   GFR: Estimated Creatinine Clearance: 21.5 mL/min (A) (by C-G formula based on SCr of 4.35 mg/dL (H)). Liver Function Tests: No results for input(s): "AST", "ALT", "ALKPHOS", "BILITOT", "PROT", "ALBUMIN" in the last 168 hours. No results for input(s): "LIPASE", "AMYLASE" in the last 168 hours. No results for input(s): "AMMONIA" in the last 168 hours. Coagulation Profile: No results for input(s): "INR", "PROTIME" in the last 168 hours. Cardiac Enzymes: No results for  input(s): "CKTOTAL", "CKMB", "CKMBINDEX", "TROPONINI" in the last 168 hours. BNP (last 3 results) No results for input(s): "PROBNP" in the last 8760 hours. HbA1C: No results for input(s): "HGBA1C" in the last 72 hours. CBG: Recent Labs  Lab 03/08/22 0230  GLUCAP 119*   Lipid Profile: No results for input(s): "CHOL", "HDL", "LDLCALC", "TRIG", "CHOLHDL", "LDLDIRECT" in the last 72 hours. Thyroid Function Tests: No results for input(s): "TSH", "T4TOTAL", "FREET4", "T3FREE", "THYROIDAB" in the last 72 hours. Anemia Panel: No results for input(s): "VITAMINB12", "FOLATE", "FERRITIN", "TIBC", "IRON", "RETICCTPCT" in the last 72 hours. Sepsis Labs: No results for input(s): "PROCALCITON", "LATICACIDVEN" in the last 168 hours.  No results found for this or any previous visit (from the past 240 hour(s)).       Radiology Studies: US RENAL  Result Date: 03/09/2022 CLINICAL DATA:  Renal dysfunction EXAM: RENAL / URINARY TRACT ULTRASOUND COMPLETE COMPARISON:  CT done on 12/11/2017 FINDINGS: Right Kidney: Renal measurements: 8.8 x 4.6 x 6.2 cm = volume: 130.6 mL. There is no hydronephrosis. There is marked increase in cortical echogenicity. Left Kidney: Renal measurements: 9.5 x 7.7 by 5.4 cm = volume: 203.7 mL. There is no hydronephrosis. There is increased cortical echogenicity. Evaluation is limited by patient's body habitus and adjacent bowel gas. Bladder: Not distended and not visualized. Other: None. IMPRESSION: There is no hydronephrosis. There is increased cortical echogenicity suggesting medical renal disease. Electronically Signed   By: Elmer Picker M.D.   On: 03/09/2022 12:15        Scheduled Meds:  sodium chloride   Intravenous Once   acetaminophen  325 mg Oral Q6H   FLUoxetine  10 mg Oral Daily   hydrALAZINE  50 mg Oral Q8H   levothyroxine  125 mcg Oral Q0600   NIFEdipine  60 mg Oral BID   pantoprazole  40 mg Oral BID   senna  1 tablet Oral BID   Continuous  Infusions:  ciprofloxacin 400 mg (03/09/22 0915)   lactated ringers 75 mL/hr at 03/09/22 0430     LOS: 1 day    Time spent: 45 minutes spent on chart review, discussion with nursing staff, consultants, updating family and interview/physical exam; more than 50% of that time was spent in counseling and/or coordination of care.    Geradine Girt, DO [facility] Available via Epic secure chat 7am-7pm After these hours, please refer to coverage provider listed on [email or web address] 03/09/2022, 12:58 PM

## 2022-03-10 DIAGNOSIS — Z9889 Other specified postprocedural states: Secondary | ICD-10-CM | POA: Diagnosis not present

## 2022-03-10 LAB — CBC
HCT: 23.1 % — ABNORMAL LOW (ref 36.0–46.0)
Hemoglobin: 7.4 g/dL — ABNORMAL LOW (ref 12.0–15.0)
MCH: 30 pg (ref 26.0–34.0)
MCHC: 32 g/dL (ref 30.0–36.0)
MCV: 93.5 fL (ref 80.0–100.0)
Platelets: 149 10*3/uL — ABNORMAL LOW (ref 150–400)
RBC: 2.47 MIL/uL — ABNORMAL LOW (ref 3.87–5.11)
RDW: 15.9 % — ABNORMAL HIGH (ref 11.5–15.5)
WBC: 7.1 10*3/uL (ref 4.0–10.5)
nRBC: 0 % (ref 0.0–0.2)

## 2022-03-10 LAB — BASIC METABOLIC PANEL
Anion gap: 6 (ref 5–15)
BUN: 36 mg/dL — ABNORMAL HIGH (ref 6–20)
CO2: 20 mmol/L — ABNORMAL LOW (ref 22–32)
Calcium: 7.6 mg/dL — ABNORMAL LOW (ref 8.9–10.3)
Chloride: 113 mmol/L — ABNORMAL HIGH (ref 98–111)
Creatinine, Ser: 3.58 mg/dL — ABNORMAL HIGH (ref 0.44–1.00)
GFR, Estimated: 15 mL/min — ABNORMAL LOW (ref >60)
Glucose, Bld: 93 mg/dL (ref 70–99)
Potassium: 3.9 mmol/L (ref 3.5–5.1)
Sodium: 139 mmol/L (ref 135–145)

## 2022-03-10 LAB — BPAM RBC
Blood Product Expiration Date: 202309012359
ISSUE DATE / TIME: 202308091347
Unit Type and Rh: 6200

## 2022-03-10 LAB — TYPE AND SCREEN
ABO/RH(D): A POS
Antibody Screen: NEGATIVE
Unit division: 0

## 2022-03-10 NOTE — TOC Initial Note (Signed)
Transition of Care [facility]) - Initial/Assessment Note    Patient Details  Name: Dawn Thomas MRN: [id number] Date of Birth: 1975/12/25  Transition of Care [facility]) CM/SW Contact:    Marilu Favre, RN Phone Number: 03/10/2022, 11:32 AM  Clinical Narrative:                  PAtient from home with her sister and children ( 34 and 46 year old daughters).   Discussed PT recommendations HHPT ( pending progression ) and [facility]. Patient does not want [facility].    NCM called home health agencies covering her address , unable to find an accepting home health agency for medicaid.  Patient's  daughter goes to OP therapy at [facility] , [facility] , [address] [place] phone [phone] fax [phone]. NCM did not find a referral form form on their website , called and left message.  Expected Discharge Plan: Home/Self Care Barriers to Discharge: Continued Medical Work up   Patient Goals and CMS Choice Patient states their goals for this hospitalization and ongoing recovery are:: to return to home   Choice offered to / list presented to : Patient  Expected Discharge Plan and Services Expected Discharge Plan: Home/Self Care   Discharge Planning Services: CM Consult Post Acute Care Choice:  (OP PT) Living arrangements for the past 2 months: Single Family Home                 DME Arranged:  (PAtient does NOT want a [facility])                    Prior Living Arrangements/Services Living arrangements for the past 2 months: Single Family Home Lives with:: Siblings Patient language and need for interpreter reviewed:: Yes        Need for Family Participation in Patient Care: Yes (Comment) Care giver support system in place?: Yes (comment)   Criminal Activity/Legal Involvement Pertinent to Current Situation/Hospitalization: No - Comment as needed  Activities of Daily Living      Permission Sought/Granted    Permission granted to share information with : No              Emotional Assessment Appearance:: Appears stated age Attitude/Demeanor/Rapport: Engaged Affect (typically observed): Accepting Orientation: : Oriented to Self, Oriented to Place, Oriented to  Time, Oriented to Situation Alcohol / Substance Use: Not Applicable Psych Involvement: No (comment)  Admission diagnosis:  Panniculitis [M79.3] Patient Active Problem List   Diagnosis Date Noted   Syncope 03/08/2022   ABLA (acute blood loss anemia) 03/08/2022   Mood disorder ([place]) 03/08/2022   S/P panniculectomy 05/21/2021   Symptomatic mammary hypertrophy 05/21/2021   Back pain 05/21/2021   Essential hypertension 08/20/2020   Chronic kidney disease, stage 3b ([place]) 08/20/2020   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   COVID-19 virus infection 08/19/2020   CAP (community acquired pneumonia) 07/11/2018   Acute respiratory failure with hypoxia ([place]) 07/11/2018   Multifocal pneumonia 07/11/2018   Hypothyroidism 07/11/2018   Acute kidney injury superimposed on chronic kidney disease ([place]) 05/31/2017   Bradycardia 05/31/2017   Morbid obesity ([place]) 05/30/2017   Right ankle pain 01/18/2012   PCP:  Virginia Rochester, [place] Pharmacy:   [place], [place] [address] Phone: [phone] Fax: [phone]     Social Determinants of Health (SDOH) Interventions  Readmission Risk Interventions     No data to display

## 2022-03-10 NOTE — Progress Notes (Signed)
Mobility Specialist - Progress Note   03/10/22 1230  Mobility  Activity Ambulated with assistance in hallway  Level of Assistance Contact guard assist, steadying assist  Assistive Device Front wheel [place]  Distance Ambulated (ft) 140 ft  Activity Response Tolerated well  $Mobility charge 1 Mobility   Pt received in bed and agreed to mobility. Used restroom prior to ambulation. Pt left in bed with all needs met and call bell in reach.   Roderick Pee Mobility Specialist

## 2022-03-10 NOTE — Consult Note (Addendum)
Reason for Consult: Acute kidney injury on chronic kidney disease stage IV Referring Physician: Rudean Curt DO [facility])  HPI:  46 year old woman with past medical history significant for hypertension, bronchial asthma, GERD, hypothyroidism, obesity, peripartum cardiomyopathy and chronic kidney disease stage IV (baseline creatinine around 2.5) who follows up with Dr. Olivia Mackie at [facility] in [place].  She was admitted to the hospital 3 days ago and underwent elective panniculectomy on 03/07/2022 that was unfortunately complicated by acute blood loss anemia (hemoglobin dropped from 12.5 to 7.1) with relative hypotension/syncope.  She suffered acute kidney injury with a peak creatinine of 4.3 on 8/9 and this morning, creatinine has improved to 3.6 after PRBC transfusion/fluids overnight.  Unfortunately she has continued serosanguineous JP drainage losses and hemoglobin again this morning was 7.4.  She informs me that her underlying chronic kidney disease has been worked up in the past and is due to hypertension.  She denies any nausea, vomiting or diarrhea and reports to be drinking fluids without any problems; she has not had any bowel movements but has passed flatus.  She denies any chest pain or shortness of breath at this time.  She is not on any RAAS blockers and has not gotten any nonsteroidal anti-inflammatory drugs.  She has not had any iodinated intravenous contrast and renal ultrasound shows features consistent with chronic kidney disease but no hydronephrosis.  She reports to be making "good amounts of urine" but this is not charted.  Urinalysis was significant for some hematuria and proteinuria with urine sodium of 19.  Past Medical History:  Diagnosis Date   Anemia    hx of   Asthma    Complication of anesthesia    woke up during surgery    Depression    GERD (gastroesophageal reflux disease)    Hypertension    Hypothyroidism    Migraines    Peripartum cardiomyopathy  08/20/2020   Renal disorder    stage 3- [facility] - Dr Olivia Mackie   Renal insufficiency    Sleep apnea    mild    Tendonitis    Thyroid disease     Past Surgical History:  Procedure Laterality Date   CESAREAN SECTION     CHOLECYSTECTOMY     DILATION AND CURETTAGE OF UTERUS     LAPAROSCOPIC GASTRIC SLEEVE RESECTION N/A 05/30/2017   Procedure: LAPAROSCOPIC GASTRIC SLEEVE RESECTION WITH UPPPER ENDO AND HIATAL HERNIA REPAIR;  Surgeon: Kieth Brightly Arta Bruce, MD;  Location: [facility];  Service: General;  Laterality: N/A;   PANNICULECTOMY N/A 03/07/2022   Procedure: PANNICULECTOMY WITH LIPOSUCTION;  Surgeon: Wallace Going, DO;  Location: [place];  Service: Plastics;  Laterality: N/A;  4 hours    Family History  Problem Relation Age of Onset   Diabetes Mother    Hypertension Mother    Hypertension Father    Diabetes Father    Sudden death Father    Heart attack Neg Hx    Hyperlipidemia Neg Hx     Social History:  reports that she has never smoked. She has never used smokeless tobacco. She reports that she does not drink alcohol and does not use drugs.  Allergies:  Allergies  Allergen Reactions   Maxalt [Rizatriptan Benzoate] Shortness Of Breath   Zithromax [Azithromycin] Other (See Comments)    "messes with my breathing"   Adhesive [Tape] Other (See Comments)    Burns skin   Hydrocodone Hives   Nsaids Other (See Comments)    Renal  insufficiency.   Omeprazole Hives   Penicillins Hives    Medications: I have reviewed the patient's current medications. Scheduled:  acetaminophen  325 mg Oral Q6H   FLUoxetine  10 mg Oral Daily   hydrALAZINE  50 mg Oral Q8H   levothyroxine  125 mcg Oral Q0600   NIFEdipine  60 mg Oral BID   pantoprazole  40 mg Oral BID   senna  1 tablet Oral BID       Latest Ref Rng & Units 03/10/2022    7:28 AM 03/09/2022    7:00 PM 03/09/2022    6:34 AM  BMP  Glucose 70 - 99 mg/dL 93  110  99   BUN 6 - 20 mg/dL 36  38  35   Creatinine 0.44 - 1.00  mg/dL 3.58  4.03  4.35   Sodium 135 - 145 mmol/L 139  138  137   Potassium 3.5 - 5.1 mmol/L 3.9  4.2  4.1   Chloride 98 - 111 mmol/L 113  111  109   CO2 22 - 32 mmol/L 20  20  22    Calcium 8.9 - 10.3 mg/dL 7.6  7.8  7.6       Latest Ref Rng & Units 03/10/2022    7:28 AM 03/09/2022    7:00 PM 03/09/2022    6:34 AM  CBC  WBC 4.0 - 10.5 K/uL 7.1   7.5   Hemoglobin 12.0 - 15.0 g/dL 7.4  8.7  7.1   Hematocrit 36.0 - 46.0 % 23.1  27.3  22.9   Platelets 150 - 400 K/uL 149   154    Urinalysis    Component Value Date/Time   COLORURINE YELLOW 03/09/2022 1526   APPEARANCEUR CLEAR 03/09/2022 1526   LABSPEC 1.005 03/09/2022 1526   PHURINE 5.0 03/09/2022 1526   GLUCOSEU NEGATIVE 03/09/2022 1526   HGBUR LARGE (A) 03/09/2022 1526   BILIRUBINUR NEGATIVE 03/09/2022 1526   KETONESUR NEGATIVE 03/09/2022 1526   PROTEINUR 100 (A) 03/09/2022 1526   UROBILINOGEN 1.0 10/04/2013 2130   NITRITE NEGATIVE 03/09/2022 [place] 03/09/2022 1526      US RENAL  Result Date: 03/09/2022 CLINICAL DATA:  Renal dysfunction EXAM: RENAL / URINARY TRACT ULTRASOUND COMPLETE COMPARISON:  CT done on 12/11/2017 FINDINGS: Right Kidney: Renal measurements: 8.8 x 4.6 x 6.2 cm = volume: 130.6 mL. There is no hydronephrosis. There is marked increase in cortical echogenicity. Left Kidney: Renal measurements: 9.5 x 7.7 by 5.4 cm = volume: 203.7 mL. There is no hydronephrosis. There is increased cortical echogenicity. Evaluation is limited by patient's body habitus and adjacent bowel gas. Bladder: Not distended and not visualized. Other: None. IMPRESSION: There is no hydronephrosis. There is increased cortical echogenicity suggesting medical renal disease. Electronically Signed   By: Elmer Picker M.D.   On: 03/09/2022 12:15    Review of Systems  Constitutional:  Positive for fatigue. Negative for activity change, appetite change, chills and fever.  HENT:  Negative for nosebleeds, sore throat and trouble  swallowing.   Eyes:  Negative for pain, redness and visual disturbance.  Respiratory:  Negative for cough, chest tightness and shortness of breath.   Cardiovascular:  Positive for leg swelling. Negative for chest pain.       Chronic right lower extremity >left lower extremity edema  Gastrointestinal:  Positive for abdominal pain.       Postoperative pain status post panniculectomy  Endocrine: Negative for polydipsia and polyuria.  Genitourinary:  Negative for  dysuria, flank pain, hematuria and urgency.  Musculoskeletal:  Positive for back pain. Negative for arthralgias, myalgias and neck pain.  Neurological:  Positive for weakness and light-headedness. Negative for dizziness and headaches.       Transient lightheadedness   Blood pressure (!) 149/69, pulse 70, temperature 98 F (36.7 C), temperature source Oral, resp. rate 17, height 5' 6.5" (1.689 m), weight 119.7 kg, last menstrual period 02/24/2022, SpO2 100 %, unknown if currently breastfeeding. Physical Exam Vitals and nursing note reviewed.  Constitutional:      General: She is not in acute distress.    Appearance: Normal appearance. She is obese. She is not toxic-appearing.  HENT:     Head: Normocephalic and atraumatic.     Right Ear: External ear normal.     Left Ear: External ear normal.     Nose: Nose normal.     Mouth/Throat:     Mouth: Mucous membranes are dry.     Pharynx: Oropharynx is clear.  Eyes:     General: No scleral icterus.    Extraocular Movements: Extraocular movements intact.     Conjunctiva/sclera: Conjunctivae normal.  Cardiovascular:     Rate and Rhythm: Normal rate and regular rhythm.     Pulses: Normal pulses.  Pulmonary:     Effort: Pulmonary effort is normal.     Breath sounds: Normal breath sounds. No wheezing or rales.  Abdominal:     Comments: Abdominal binder in place with bilateral JP drains in upper quadrants (bulb filled with serosanguineous fluid)  Musculoskeletal:     Cervical back:  Normal range of motion and neck supple.     Right lower leg: Edema present.     Left lower leg: No edema.     Comments: Trace right lower extremity edema  Skin:    General: Skin is warm and dry.     Findings: No bruising.  Neurological:     General: No focal deficit present.     Mental Status: She is alert and oriented to person, place, and time.  Psychiatric:        Mood and Affect: Mood normal.     Assessment/Plan: 1.  Acute kidney injury on chronic kidney disease stage IV: Underlying chronic kidney disease from hypertensive nephrosclerosis with acute kidney injury that appears to be hemodynamically mediated in the setting of acute blood loss anemia/hemodynamic instability.  She appears to be nonoliguric with progressive improvement of renal function/creatinine as seen on labs over the past 24 hours following PRBC transfusion/intravenous fluids.  Renal ultrasound did not display any obstruction.  Management at this time will continue to remain supportive with intravenous fluids to supplement oral intake as we will continue to monitor for renal recovery.  She does not have any acute electrolyte abnormality or signs/symptoms prompting additional evaluation or intervention.  Anticipate ability to discharge her home within the next 24 to 48 hours from a renal standpoint if remains surgically appropriate. Avoid nephrotoxic medications including NSAIDs and iodinated intravenous contrast exposure unless the latter is absolutely indicated.  Preferred narcotic agents for pain control are hydromorphone, fentanyl, and methadone. Morphine should not be used. Avoid Baclofen and avoid oral sodium phosphate and magnesium citrate based laxatives / bowel preps. Continue strict Input and Output monitoring. Will monitor the patient closely with you and intervene or adjust therapy as indicated by changes in clinical status/labs.  She is an established patient with a regular nephrology follow-up under the care of Dr.  Olivia Mackie at [facility]  [employer]. 2.  Acute blood loss anemia: Secondary to postoperative losses with ongoing serosanguineous drainage from JP tubes.  PRBC transfusions as indicated for hemoglobin <7.0 or hemodynamic instability. 3.  Status post panniculectomy: Status post panniculectomy 3 days ago complicated by postoperative acute blood loss anemia/hemodynamic instability and acute kidney injury.  On antimicrobial coverage with ciprofloxacin. 4.  Hypertension: Agree with continuing to hold spironolactone at this time that she was taking prior to admission and resuming nifedipine.  Paige Monarrez K. 03/10/2022, 1:46 PM

## 2022-03-10 NOTE — Progress Notes (Signed)
PROGRESS NOTE    Dawn Thomas  XHB:[id number] DOB: 1975/11/12 DOA: 03/07/2022 PCP: Virginia Rochester, PA    Brief Narrative:  Panniculectomy yesterday.  This AM with low BPs, probable bleed.  Hgb 12.5 -> 8.6, slowing down?  CKD jumped from 2.4-2.6 -> 3.6. follows with renal MD at [facility].  Fena appears to be pre-renal.  Improved with IVF and PRBC.    Assessment and Plan: * S/P panniculectomy -Patient was admitted by plastic surgery for panniculectomy -She remains on Cipro -She was planned for dc today but had worsening anemia, worsening renal function, syncopal event (see below) -Management of this issue is by plastic surgery  Syncope -Patient did well on POD #0 but had a syncopal event  -Very likely associated with equilibration from her blood loss anemia/AKI -resolved  ABLA (acute blood loss anemia) -Between post-operative blood (which was heavy but now is decreasing) and menstrual flow, the patient dropped her Hgb significantly -Hgb around 7 so will transfuse -suspect some volume  dilution  Acute kidney injury superimposed on chronic kidney disease ([place]) -She has advanced renal disease at baseline- 2.5 -Suspect that she had AKI associated with ABLA -she follow with a renal MD at [facility] -renal U/S does not show hydronephrosis -avoid hypotension -Cr improving -plastics would like consult from renal  Mood disorder ([place]) -Continue fluoxetine  Essential hypertension -avoid hypotension -Hold spironolactone until outpatient f/u  Hypothyroidism -Continue Synthroid  Morbid obesity ([place]) -Body mass index is 41.97 kg/m..  -Weight loss should be encouraged -Outpatient PCP/bariatric medicine/bariatric surgery f/u encouraged   Will sign off with renal managing the elevated CR   DVT prophylaxis: Place and maintain sequential compression device Start: 03/08/22 1117 SCDs Start: 03/07/22 1449    Code Status: Full Code   Disposition Plan:  Level of care: Med-Surg Status  is: Inpatient        Subjective: No current complaints, thinks the bleeding from drains has improved  Objective: Vitals:   03/09/22 1719 03/09/22 2052 03/10/22 0449 03/10/22 0745  BP: 135/65 118/61 (!) 136/58 (!) 149/69  Pulse: 77 82 68 70  Resp: 17 20 18 17   Temp: 99.2 F (37.3 C) 99.3 F (37.4 C) 98.6 F (37 C) 98 F (36.7 C)  TempSrc: Oral Oral Oral Oral  SpO2: 100% 97% 96% 100%  Weight:      Height:        Intake/Output Summary (Last 24 hours) at 03/10/2022 1159 Last data filed at 03/10/2022 8101 Gross per 24 hour  Intake 1509.92 ml  Output 390 ml  Net 1119.92 ml   Filed Weights   03/07/22 0647  Weight: 119.7 kg    Examination:   General: Appearance:    Severely obese female in no acute distress     Lungs:    respirations unlabored  Heart:    Normal heart rate. Normal rhythm. No murmurs, rubs, or gallops.    MS:   All extremities are intact.    Neurologic:   Awake, alert, oriented x 3. No apparent focal neurological           defect.        Data Reviewed: I have personally reviewed following labs and imaging studies  CBC: Recent Labs  Lab 03/08/22 0811 03/08/22 1821 03/09/22 0634 03/09/22 1900 03/10/22 0728  WBC 8.1 8.9 7.5  --  7.1  HGB 8.6* 8.9* 7.1* 8.7* 7.4*  HCT 27.8* 27.9* 22.9* 27.3* 23.1*  MCV 95.9 94.6 96.2  --  93.5  PLT 197 171 154  --  998*   Basic Metabolic Panel: Recent Labs  Lab 03/08/22 0811 03/09/22 0634 03/09/22 1900 03/10/22 0728  NA 137 137 138 139  K 4.8 4.1 4.2 3.9  CL 108 109 111 113*  CO2 22 22 20* 20*  GLUCOSE 118* 99 110* 93  BUN 30* 35* 38* 36*  CREATININE 3.56* 4.35* 4.03* 3.58*  CALCIUM 7.8* 7.6* 7.8* 7.6*   GFR: Estimated Creatinine Clearance: 26.1 mL/min (A) (by C-G formula based on SCr of 3.58 mg/dL (H)). Liver Function Tests: No results for input(s): "AST", "ALT", "ALKPHOS", "BILITOT", "PROT", "ALBUMIN" in the last 168 hours. No results for input(s): "LIPASE", "AMYLASE" in the last 168  hours. No results for input(s): "AMMONIA" in the last 168 hours. Coagulation Profile: No results for input(s): "INR", "PROTIME" in the last 168 hours. Cardiac Enzymes: No results for input(s): "CKTOTAL", "CKMB", "CKMBINDEX", "TROPONINI" in the last 168 hours. BNP (last 3 results) No results for input(s): "PROBNP" in the last 8760 hours. HbA1C: No results for input(s): "HGBA1C" in the last 72 hours. CBG: Recent Labs  Lab 03/08/22 0230  GLUCAP 119*   Lipid Profile: No results for input(s): "CHOL", "HDL", "LDLCALC", "TRIG", "CHOLHDL", "LDLDIRECT" in the last 72 hours. Thyroid Function Tests: No results for input(s): "TSH", "T4TOTAL", "FREET4", "T3FREE", "THYROIDAB" in the last 72 hours. Anemia Panel: No results for input(s): "VITAMINB12", "FOLATE", "FERRITIN", "TIBC", "IRON", "RETICCTPCT" in the last 72 hours. Sepsis Labs: No results for input(s): "PROCALCITON", "LATICACIDVEN" in the last 168 hours.  No results found for this or any previous visit (from the past 240 hour(s)).       Radiology Studies: US RENAL  Result Date: 03/09/2022 CLINICAL DATA:  Renal dysfunction EXAM: RENAL / URINARY TRACT ULTRASOUND COMPLETE COMPARISON:  CT done on 12/11/2017 FINDINGS: Right Kidney: Renal measurements: 8.8 x 4.6 x 6.2 cm = volume: 130.6 mL. There is no hydronephrosis. There is marked increase in cortical echogenicity. Left Kidney: Renal measurements: 9.5 x 7.7 by 5.4 cm = volume: 203.7 mL. There is no hydronephrosis. There is increased cortical echogenicity. Evaluation is limited by patient's body habitus and adjacent bowel gas. Bladder: Not distended and not visualized. Other: None. IMPRESSION: There is no hydronephrosis. There is increased cortical echogenicity suggesting medical renal disease. Electronically Signed   By: Elmer Picker M.D.   On: 03/09/2022 12:15        Scheduled Meds:  acetaminophen  325 mg Oral Q6H   FLUoxetine  10 mg Oral Daily   hydrALAZINE  50 mg Oral Q8H    levothyroxine  125 mcg Oral Q0600   NIFEdipine  60 mg Oral BID   pantoprazole  40 mg Oral BID   senna  1 tablet Oral BID   Continuous Infusions:  ciprofloxacin 400 mg (03/10/22 1011)   lactated ringers 75 mL/hr at 03/09/22 0430     LOS: 2 days    Time spent: 45 minutes spent on chart review, discussion with nursing staff, consultants, updating family and interview/physical exam; more than 50% of that time was spent in counseling and/or coordination of care.    Geradine Girt, DO [facility] Available via Epic secure chat 7am-7pm After these hours, please refer to coverage provider listed on [email or web address] 03/10/2022, 11:59 AM

## 2022-03-10 NOTE — Progress Notes (Signed)
3 Days Post-Op  Subjective: Patient is a 46 year old female status post panniculectomy with liposuction of her abdomen at [facility] with Dr. Marla Roe on 03/07/2022.  She stayed overnight for observation, postoperatively she did develop a questionable bleed and has had significant bloody drainage from her JP drains.  Hospitalist service was consulted for assistance with medical management.   Patient received 1 unit PRBC yesterday  Today, patient reports she is doing well. She states that she feels the drainage output is slowing down, especially to the left drain. Patient states she has been up and walking to the bathroom, as well as walking in the hallway with PT without issue. She denies any dizziness or lightheadedness. Patient reports she has been eating and drinking without issue. Patient states she has been voiding without issue and has been passing gas.   Patient states she would like to go home. She reports she has her sister and her daughter to help care for her at home.   Hgb 8.7 last night -> 7.4 this morning.   Objective: Vital signs in last 24 hours: Temp:  [98 F (36.7 C)-99.6 F (37.6 C)] 98 F (36.7 C) (08/10 0745) Pulse Rate:  [66-82] 70 (08/10 0745) Resp:  [16-20] 17 (08/10 0745) BP: (113-149)/(55-76) 149/69 (08/10 0745) SpO2:  [96 %-100 %] 100 % (08/10 0745)    Intake/Output from previous day: 08/09 0701 - 08/10 0700 In: 1629.9 [P.O.:120; I.V.:1509.9] Out: 430 [Drains:430] Intake/Output this shift: No intake/output data recorded.  General appearance: alert, cooperative, and no distress Resp: unlabored breathing, no respiratory distress GI: Abdomen is soft, and mildly tender to palpation near the incision. There is no overlying erythema or ecchymosis to the abdomen. There are no significant fluid collections palpated on exam. Drains are in place and functioning bilaterally. There is approximately 20 cc of serosanguinous drainage in each bulb.   Lab Results:      Latest Ref Rng & Units 03/10/2022    7:28 AM 03/09/2022    7:00 PM 03/09/2022    6:34 AM  CBC  WBC 4.0 - 10.5 K/uL 7.1   7.5   Hemoglobin 12.0 - 15.0 g/dL 7.4  8.7  7.1   Hematocrit 36.0 - 46.0 % 23.1  27.3  22.9   Platelets 150 - 400 K/uL 149   154     BMET Recent Labs    03/09/22 1900 03/10/22 0728  NA 138 139  K 4.2 3.9  CL 111 113*  CO2 20* 20*  GLUCOSE 110* 93  BUN 38* 36*  CREATININE 4.03* 3.58*  CALCIUM 7.8* 7.6*   PT/INR No results for input(s): "LABPROT", "INR" in the last 72 hours. ABG No results for input(s): "PHART", "HCO3" in the last 72 hours.  Invalid input(s): "PCO2", "PO2"  Studies/Results: US RENAL  Result Date: 03/09/2022 CLINICAL DATA:  Renal dysfunction EXAM: RENAL / URINARY TRACT ULTRASOUND COMPLETE COMPARISON:  CT done on 12/11/2017 FINDINGS: Right Kidney: Renal measurements: 8.8 x 4.6 x 6.2 cm = volume: 130.6 mL. There is no hydronephrosis. There is marked increase in cortical echogenicity. Left Kidney: Renal measurements: 9.5 x 7.7 by 5.4 cm = volume: 203.7 mL. There is no hydronephrosis. There is increased cortical echogenicity. Evaluation is limited by patient's body habitus and adjacent bowel gas. Bladder: Not distended and not visualized. Other: None. IMPRESSION: There is no hydronephrosis. There is increased cortical echogenicity suggesting medical renal disease. Electronically Signed   By: Elmer Picker M.D.   On: 03/09/2022 12:15  Anti-infectives: Anti-infectives (From admission, onward)    Start     Dose/Rate Route Frequency Ordered Stop   03/10/22 1000  ciprofloxacin (CIPRO) IVPB 400 mg        400 mg 200 mL/hr over 60 Minutes Intravenous Daily 03/09/22 1410 03/15/22 0959   03/07/22 1545  ciprofloxacin (CIPRO) IVPB 400 mg  Status:  Discontinued        400 mg 200 mL/hr over 60 Minutes Intravenous Every 12 hours 03/07/22 1448 03/09/22 1410   03/07/22 0645  ceFAZolin (ANCEF) IVPB 3g/100 mL premix        3 g 200 mL/hr over 30  Minutes Intravenous On call to O.R. 03/07/22 9233 03/07/22 0804       Assessment/Plan: s/p Procedure(s): PANNICULECTOMY WITH LIPOSUCTION  Plan: -Continue to monitor Hgb, will recheck in the morning -Monitor drain output  -Continue abdominal binder at all times -Plan for possible discharge tomorrow -Medical management per hospitalist team - possible nephrology consult while patient is in hospital; pt will need to f/u with her nephrologist once discharged as well as her PCP for outpatient labs  Objective findings and plan were discussed with Dr. Marla Roe     LOS: 2 days    Clance Boll, PA-C 03/10/2022

## 2022-03-11 LAB — RENAL FUNCTION PANEL
Albumin: 2.8 g/dL — ABNORMAL LOW (ref 3.5–5.0)
Anion gap: 5 (ref 5–15)
BUN: 33 mg/dL — ABNORMAL HIGH (ref 6–20)
CO2: 21 mmol/L — ABNORMAL LOW (ref 22–32)
Calcium: 8 mg/dL — ABNORMAL LOW (ref 8.9–10.3)
Chloride: 113 mmol/L — ABNORMAL HIGH (ref 98–111)
Creatinine, Ser: 3.03 mg/dL — ABNORMAL HIGH (ref 0.44–1.00)
GFR, Estimated: 19 mL/min — ABNORMAL LOW (ref >60)
Glucose, Bld: 103 mg/dL — ABNORMAL HIGH (ref 70–99)
Phosphorus: 3.9 mg/dL (ref 2.5–4.6)
Potassium: 4.3 mmol/L (ref 3.5–5.1)
Sodium: 139 mmol/L (ref 135–145)

## 2022-03-11 LAB — MAGNESIUM: Magnesium: 2 mg/dL (ref 1.7–2.4)

## 2022-03-11 LAB — CBC
HCT: 26.7 % — ABNORMAL LOW (ref 36.0–46.0)
Hemoglobin: 8.4 g/dL — ABNORMAL LOW (ref 12.0–15.0)
MCH: 29.7 pg (ref 26.0–34.0)
MCHC: 31.5 g/dL (ref 30.0–36.0)
MCV: 94.3 fL (ref 80.0–100.0)
Platelets: 191 10*3/uL (ref 150–400)
RBC: 2.83 MIL/uL — ABNORMAL LOW (ref 3.87–5.11)
RDW: 15.8 % — ABNORMAL HIGH (ref 11.5–15.5)
WBC: 8.2 10*3/uL (ref 4.0–10.5)
nRBC: 0 % (ref 0.0–0.2)

## 2022-03-11 NOTE — Discharge Summary (Signed)
Physician Discharge Summary  Patient ID: Dawn Thomas MRN: [id number] DOB/AGE: 01/11/1976 46 y.o.  Admit date: 03/07/2022 Discharge date: 03/11/2022  Admission Diagnoses: Panniculitis  Discharge Diagnoses:  Principal Problem:   S/P panniculectomy Active Problems:   Morbid obesity ([place])   Acute kidney injury superimposed on chronic kidney disease ([place])   Hypothyroidism   Essential hypertension   Syncope   ABLA (acute blood loss anemia)   Mood disorder [facility])   Discharged Condition: fair  Hospital Course: Patient is a 46 year old female who presented to the [facility] on 03/07/2022 for panniculectomy with liposuction with Dr. Marla Roe.  She did stay overnight for observation on 03/07/2022, subsequently developed a bleed with significant bloody drainage from her bilateral JP drains.  Hospital service was consulted for assistance with medical management, nephrology was subsequently consulted due to AKI related to acute blood loss anemia as well as management of her CKD while hospitalized.  Baseline creatinine preoperatively approximately 2.4, subsequently developed AKI with creatinine peaking at 4.35.  Creatinine improved today to 3.03.  Hemoglobin preoperatively 12.5 after recent iron infusions.  Hemoglobin improved to 8.4 this a.m. after a low of 7.1 on 03/09/2022.  Vital signs have been stable, pain well-controlled.  Patient denies any pulmonary or cardiac symptoms.  She denies any lightheadedness or dizziness.  She is ambulating normally and has been evaluated by PT and will follow-up with PT as an outpatient.  Patient is aware to follow-up with nephrology and PCP outpatient as well as close follow-up with plastic surgery.  Consults: nephrology and internal medicine, physical therapy, case management  Significant Diagnostic Studies: labs: CBC, BMP, renal function panel and ultrasound  Treatments: IV hydration, antibiotics: Cipro, analgesia: acetaminophen, Morphine, and tramadol,  and surgery: Panniculectomy and liposuction of abdomen  Discharge Exam: Blood pressure 136/71, pulse 65, temperature 98.1 F (36.7 C), temperature source Oral, resp. rate 18, height 5' 6.5" (1.689 m), weight 119.7 kg, last menstrual period 02/24/2022, SpO2 100 %, unknown if currently breastfeeding. General appearance: alert, cooperative, no distress, and resting in bed Head: Normocephalic, without obvious abnormality, atraumatic Resp: Unlabored GI: Soft, mild tenderness palpation as expected, no significant swelling noted, honeycomb dressings in place with mild drainage noted.  Bilateral JP drains in place with serosanguineous drainage in bulbs.  50 cc in each bulb.  No erythema or cellulitic changes noted.  No ecchymosis noted of her abdomen. Extremities: extremities normal, atraumatic, no cyanosis or edema Pulses: 2+ and symmetric  Disposition: Discharge disposition: 01-Home or Self Care       Discharge Instructions     Call MD for:  difficulty breathing, headache or visual disturbances   Complete by: As directed    Call MD for:  extreme fatigue   Complete by: As directed    Call MD for:  hives   Complete by: As directed    Call MD for:  persistant dizziness or light-headedness   Complete by: As directed    Call MD for:  persistant nausea and vomiting   Complete by: As directed    Call MD for:  redness, tenderness, or signs of infection (pain, swelling, redness, odor or green/yellow discharge around incision site)   Complete by: As directed    Call MD for:  severe uncontrolled pain   Complete by: As directed    Call MD for:  temperature >100.4   Complete by: As directed    Diet - low sodium heart healthy   Complete by: As directed    Increase activity  slowly   Complete by: As directed       Allergies as of 03/11/2022       Reactions   Maxalt [rizatriptan Benzoate] Shortness Of Breath   Zithromax [azithromycin] Other (See Comments)   "messes with my breathing"    Adhesive [tape] Other (See Comments)   Burns skin   Hydrocodone Hives   Nsaids Other (See Comments)   Renal insufficiency.   Omeprazole Hives   Penicillins Hives        Medication List     TAKE these medications    acetaminophen 500 MG tablet Commonly known as: TYLENOL Take 1,000 mg by mouth every 6 (six) hours as needed for mild pain.   albuterol 108 (90 Base) MCG/ACT inhaler Commonly known as: VENTOLIN HFA Inhale 2 puffs into the lungs every 6 (six) hours as needed for wheezing or shortness of breath.   B-12 PO Take 1 tablet by mouth daily.   ergocalciferol 1.25 MG (50000 UT) capsule Commonly known as: VITAMIN D2 Take 50,000 Units by mouth once a week.   FLUoxetine 10 MG capsule Commonly known as: PROZAC Take 10 mg by mouth daily.   folic acid 1 MG tablet Commonly known as: FOLVITE Take 1 mg by mouth daily.   hydrALAZINE 50 MG tablet Commonly known as: APRESOLINE Take 1 tablet (50 mg total) by mouth every 8 (eight) hours.   levothyroxine 125 MCG tablet Commonly known as: SYNTHROID Take 125 mcg by mouth daily.   NIFEdipine 60 MG 24 hr tablet Commonly known as: ADALAT CC Take 60 mg by mouth 2 (two) times daily.   ondansetron 4 MG tablet Commonly known as: Zofran Take 1 tablet (4 mg total) by mouth every 8 (eight) hours as needed for nausea or vomiting.   pantoprazole 40 MG tablet Commonly known as: PROTONIX Take 40 mg by mouth 2 (two) times daily.   spironolactone 25 MG tablet Commonly known as: ALDACTONE Take 25 mg by mouth daily.        Follow-up Information     Dillingham, Loel Lofty, DO Follow up in 10 day(s).   Specialty: Plastic Surgery Contact information: [address] [place] [address] [phone]         Thereasa Distance, MD Follow up in 1 week(s).   Specialty: Nephrology Why: Lodema Hong information: [address] [place] [address] [phone]                 [facility] [address] [place], [place] [address] [phone]  Signed: Carola Rhine Trejan Buda 03/11/2022, 11:12 AM

## 2022-03-11 NOTE — Progress Notes (Signed)
Mobility Specialist - Progress Note     03/11/22 1123  Mobility  Activity Ambulated independently in hallway  Level of Assistance Independent after set-up  Assistive Device None  Distance Ambulated (ft) 550 ft  Activity Response Tolerated well  $Mobility charge 1 Mobility   Pt was agreeable to mobilize was found in recliner chair. Pt c/o abdominal binder being too big for her. After ambulation pt returned to recliner chair for lunch with all necessities in reach.  Ferd Hibbs Mobility Specialist

## 2022-03-11 NOTE — Progress Notes (Signed)
4 Days Post-Op  Subjective: 46 year old female status post panniculectomy with liposuction of her abdomen at [facility] on 03/07/2022 with Dr. Marla Roe.  She stayed overnight for observation, subsequently developed a bleed with significant bloody drainage from her bilateral JP drains.  Hospitalist service was consulted for assistance with medical management.  Nephrology was subsequently consulted due to AKI related to acute blood loss anemia.  Patient is doing well today, she requests to go home.  Per nursing staff, patient requested removal of her IV yesterday evening due to pain in her left arm.   This AM patient is resting in bed, denies any symptomatic changes overnight.  She denies any chest pain shortness of breath.  Denies any dizziness.  She reports she has been ambulating with PT and in her room without any issues.  She feels as if the JP drainage has decreased, reports last time it was emptied was yesterday afternoon.  She reports that she has some burning sensation of the left JP drain insertion site.  She has been eating and drinking normally.  JP drain output over the past 24 hours approximately 390 cc per I/O report.  Hemoglobin this a.m. 8.4, up from 7.4 yesterday.  Hematocrit 26.7, up from 23.1 yesterday.  Creatinine improved from 3.58-3.03 with her baseline approximately 2.4-2.6.  Objective: Vital signs in last 24 hours: Temp:  [98 F (36.7 C)-98.9 F (37.2 C)] 98.9 F (37.2 C) (08/11 0416) Pulse Rate:  [69-70] 70 (08/11 0416) Resp:  [16-20] 16 (08/11 0416) BP: (110-152)/(51-66) 110/51 (08/11 0416) SpO2:  [98 %-100 %] 98 % (08/11 0416)    Intake/Output from previous day: 08/10 0701 - 08/11 0700 In: 680 [P.O.:480; IV Piggyback:200] Out: 390 [Drains:390] Intake/Output this shift: No intake/output data recorded.  General appearance: alert, cooperative, no distress, and resting in bed Head: Normocephalic, without obvious abnormality, atraumatic Resp:  Unlabored GI: Soft, mild tenderness to palpation as expected, no significant swelling noted.  Honeycomb dressings in place with mild drainage noted.  Bilateral JP drains in place with serosanguineous drainage, 50 cc in each bulb.  No erythema or cellulitic changes noted.  No ecchymosis noted of her abdomen. Extremities: extremities normal, atraumatic, no cyanosis or edema. Pulses: Palpable radial pulses and palpable DP pulses noted.  Lab Results:     Latest Ref Rng & Units 03/11/2022    1:08 AM 03/10/2022    7:28 AM 03/09/2022    7:00 PM  CBC  WBC 4.0 - 10.5 K/uL 8.2  7.1    Hemoglobin 12.0 - 15.0 g/dL 8.4  7.4  8.7   Hematocrit 36.0 - 46.0 % 26.7  23.1  27.3   Platelets 150 - 400 K/uL 191  149      BMET Recent Labs    03/10/22 0728 03/11/22 0108  NA 139 139  K 3.9 4.3  CL 113* 113*  CO2 20* 21*  GLUCOSE 93 103*  BUN 36* 33*  CREATININE 3.58* 3.03*  CALCIUM 7.6* 8.0*   PT/INR No results for input(s): "LABPROT", "INR" in the last 72 hours. ABG No results for input(s): "PHART", "HCO3" in the last 72 hours.  Invalid input(s): "PCO2", "PO2"  Studies/Results: US RENAL  Result Date: 03/09/2022 CLINICAL DATA:  Renal dysfunction EXAM: RENAL / URINARY TRACT ULTRASOUND COMPLETE COMPARISON:  CT done on 12/11/2017 FINDINGS: Right Kidney: Renal measurements: 8.8 x 4.6 x 6.2 cm = volume: 130.6 mL. There is no hydronephrosis. There is marked increase in cortical echogenicity. Left Kidney: Renal measurements: 9.5 x  7.7 by 5.4 cm = volume: 203.7 mL. There is no hydronephrosis. There is increased cortical echogenicity. Evaluation is limited by patient's body habitus and adjacent bowel gas. Bladder: Not distended and not visualized. Other: None. IMPRESSION: There is no hydronephrosis. There is increased cortical echogenicity suggesting medical renal disease. Electronically Signed   By: Elmer Picker M.D.   On: 03/09/2022 12:15    Anti-infectives: Anti-infectives (From admission, onward)     Start     Dose/Rate Route Frequency Ordered Stop   03/10/22 1000  ciprofloxacin (CIPRO) IVPB 400 mg        400 mg 200 mL/hr over 60 Minutes Intravenous Daily 03/09/22 1410 03/15/22 0959   03/07/22 1545  ciprofloxacin (CIPRO) IVPB 400 mg  Status:  Discontinued        400 mg 200 mL/hr over 60 Minutes Intravenous Every 12 hours 03/07/22 1448 03/09/22 1410   03/07/22 0645  ceFAZolin (ANCEF) IVPB 3g/100 mL premix        3 g 200 mL/hr over 30 Minutes Intravenous On call to O.R. 03/07/22 6153 03/07/22 0804       Assessment/Plan: s/p Procedure(s): PANNICULECTOMY WITH LIPOSUCTION  Status post panniculectomy complicated by ABLA Patient evaluated this a.m., she reports she is doing well.  She requests to go home.  Hemoglobin slightly improved to 8.4.  She is asymptomatic at this time.  No additional syncopal events.  Voiding normally.  She has been evaluated by PT, recommend home health PT unless progression noted.  Case management working on referrals.  Appreciate their assistance.  DVT prophylaxis with SCDs. FEN: Normal diet  Medical management per hospitalist team - appreciate their assistance.  Appreciate nephrology consult yesterday.  Will follow-up with nephrology outpatient.  Patient objective findings and plan discussed with Dr. Marla Roe, patient is stable for discharge pending nephrology and hospitalist recommendations.    LOS: 3 days    Charlies Constable, PA-C 03/11/2022

## 2022-03-11 NOTE — Progress Notes (Addendum)
Patient ID: Dawn Thomas, female   DOB: Mar 22, 1976, 46 y.o.   MRN: [id number] S: Feels well, no complaints.  O:BP 136/71 (BP Location: Right Arm)   Pulse 65   Temp 98.1 F (36.7 C) (Oral)   Resp 18   Ht 5' 6.5" (1.689 m)   Wt 119.7 kg   LMP 02/24/2022 (Approximate)   SpO2 100%   BMI 41.97 kg/m   Intake/Output Summary (Last 24 hours) at 03/11/2022 1225 Last data filed at 03/11/2022 0931 Gross per 24 hour  Intake 680 ml  Output 420 ml  Net 260 ml   Intake/Output: I/O last 3 completed shifts: In: 680 [P.O.:480; IV Piggyback:200] Out: 690 [Drains:690]  Intake/Output this shift:  Total I/O In: -  Out: 120 [Drains:120] Weight change:  Gen: NAD CVS: RRR Resp:CTA Abd: +BS, soft, NT/ND Ext: no edema  Recent Labs  Lab 03/08/22 0811 03/09/22 0634 03/09/22 1900 03/10/22 0728 03/11/22 0108  NA 137 137 138 139 139  K 4.8 4.1 4.2 3.9 4.3  CL 108 109 111 113* 113*  CO2 22 22 20* 20* 21*  GLUCOSE 118* 99 110* 93 103*  BUN 30* 35* 38* 36* 33*  CREATININE 3.56* 4.35* 4.03* 3.58* 3.03*  ALBUMIN  --   --   --   --  2.8*  CALCIUM 7.8* 7.6* 7.8* 7.6* 8.0*  PHOS  --   --   --   --  3.9   Liver Function Tests: Recent Labs  Lab 03/11/22 0108  ALBUMIN 2.8*   No results for input(s): "LIPASE", "AMYLASE" in the last 168 hours. No results for input(s): "AMMONIA" in the last 168 hours. CBC: Recent Labs  Lab 03/08/22 0811 03/08/22 1821 03/09/22 0634 03/09/22 1900 03/10/22 0728 03/11/22 0108  WBC 8.1 8.9 7.5  --  7.1 8.2  HGB 8.6* 8.9* 7.1* 8.7* 7.4* 8.4*  HCT 27.8* 27.9* 22.9* 27.3* 23.1* 26.7*  MCV 95.9 94.6 96.2  --  93.5 94.3  PLT 197 171 154  --  149* 191   Cardiac Enzymes: No results for input(s): "CKTOTAL", "CKMB", "CKMBINDEX", "TROPONINI" in the last 168 hours. CBG: Recent Labs  Lab 03/08/22 0230  GLUCAP 119*    Iron Studies: No results for input(s): "IRON", "TIBC", "TRANSFERRIN", "FERRITIN" in the last 72 hours. Studies/Results: No results found.   acetaminophen  325 mg Oral Q6H   FLUoxetine  10 mg Oral Daily   hydrALAZINE  50 mg Oral Q8H   levothyroxine  125 mcg Oral Q0600   NIFEdipine  60 mg Oral BID   pantoprazole  40 mg Oral BID   senna  1 tablet Oral BID    BMET    Component Value Date/Time   NA 139 03/11/2022 0108   K 4.3 03/11/2022 0108   CL 113 (H) 03/11/2022 0108   CO2 21 (L) 03/11/2022 0108   GLUCOSE 103 (H) 03/11/2022 0108   BUN 33 (H) 03/11/2022 0108   CREATININE 3.03 (H) 03/11/2022 0108   CALCIUM 8.0 (L) 03/11/2022 0108   GFRNONAA 19 (L) 03/11/2022 0108   GFRAA 35 (L) 01/21/2019 1633   CBC    Component Value Date/Time   WBC 8.2 03/11/2022 0108   RBC 2.83 (L) 03/11/2022 0108   HGB 8.4 (L) 03/11/2022 0108   HCT 26.7 (L) 03/11/2022 0108   PLT 191 03/11/2022 0108   MCV 94.3 03/11/2022 0108   MCH 29.7 03/11/2022 0108   MCHC 31.5 03/11/2022 0108   RDW 15.8 (H) 03/11/2022 0108   LYMPHSABS 1.5  08/22/2020 0259   MONOABS 0.3 08/22/2020 0259   EOSABS 0.0 08/22/2020 0259   BASOSABS 0.0 08/22/2020 0259     Assessment/Plan:  AKI/CKD Stage IV - underlying CKD due to HTN and AKI likely hemodynamically mediated in setting of ABLA/hypotension.  BUN/Cr continue to improve following blood transfusions and IVF's.  Has an appointment with Dr. Olivia Mackie who normally follows her as an outpatient.  Nothing further to add.  Will sign off.  Please call with questions or concerns.  ABLA - improved HTN - stable S/p panniculectomy - POD 4.  Complicated by ABLA and hemodynamic instability and AKI as above.   Disposition - stable for discharge from renal standpoint.   Donetta Potts, MD [facility]

## 2022-03-11 NOTE — Progress Notes (Signed)
Pt discharged home.

## 2022-03-11 NOTE — Progress Notes (Signed)
Physical Therapy Treatment Patient Details Name: Dawn Thomas MRN: [id number] DOB: 11/28/1975 Today's Date: 03/11/2022   History of Present Illness Patient is a 46 year old female status post panniculectomy with liposuction of her abdomen at [place] with Dr. Marla Roe on 03/07/2022.  She stayed overnight for observation, postoperatively she did develop a questionable bleed and has had significant bloody drainage from her JP drains.  PMH:  HTN, syncope, mood disorder    PT Comments    Pt admitted with above diagnosis. Pt was able to ambulate into hallway without device with good safety and good balance.  Pt progressing well and should not need f/u as well as shouldn't need a device.  Pt anxious to go home and hopeful that she can go today.  Pt currently with functional limitations due to balance and endurance deficits. Pt will benefit from skilled PT to increase their independence and safety with mobility to allow discharge to the venue listed below.      Recommendations for follow up therapy are one component of a multi-disciplinary discharge planning process, led by the attending physician.  Recommendations may be updated based on patient status, additional functional criteria and insurance authorization.  Follow Up Recommendations  No PT follow up     Assistance Recommended at Discharge PRN  Patient can return home with the following Assistance with cooking/housework;Assist for transportation;Help with stairs or ramp for entrance   Equipment Recommendations  None recommended by PT    Recommendations for Other Services       Precautions / Restrictions Precautions Precautions: Fall Precaution Comments: 2 JP drains Restrictions Weight Bearing Restrictions: No     Mobility  Bed Mobility Overal bed mobility: Independent             General bed mobility comments: incr time but no assist given.    Transfers Overall transfer level: Needs assistance Equipment used:  None Transfers: Sit to/from Stand Sit to Stand: Supervision           General transfer comment: No assist to stand from slightly raised bed    Ambulation/Gait Ambulation/Gait assistance: Supervision Gait Distance (Feet): 380 Feet Assistive device: None Gait Pattern/deviations: Step-through pattern, Decreased stride length   Gait velocity interpretation: 1.31 - 2.62 ft/sec, indicative of limited community ambulator   General Gait Details: Pt was able to ambulate without device and  without LOB  with good safety. Pt reports feeling much better amd states she is going home.   Stairs Stairs:  (declines practice)           Wheelchair Mobility    Modified Rankin (Stroke Patients Only)       Balance Overall balance assessment: Needs assistance Sitting-balance support: No upper extremity supported, Feet supported Sitting balance-Leahy Scale: Good     Standing balance support: During functional activity, No upper extremity supported Standing balance-Leahy Scale: Good                              Cognition Arousal/Alertness: Awake/alert Behavior During Therapy: WFL for tasks assessed/performed Overall Cognitive Status: Within Functional Limits for tasks assessed                                          Exercises      General Comments General comments (skin integrity, edema, etc.): VSS  Pertinent Vitals/Pain Pain Assessment Pain Assessment: Faces Faces Pain Scale: Hurts little more Pain Location: abdomen Pain Descriptors / Indicators: Discomfort, Grimacing, Guarding Pain Intervention(s): Limited activity within patient's tolerance, Monitored during session, Repositioned    Home Living                          Prior Function            PT Goals (current goals can now be found in the care plan section) Acute Rehab PT Goals Patient Stated Goal: to go home Progress towards PT goals: Progressing toward goals     Frequency    Min 3X/week      PT Plan Discharge plan needs to be updated;Equipment recommendations need to be updated    Co-evaluation              AM-PAC PT "6 Clicks" Mobility   Outcome Measure  Help needed turning from your back to your side while in a flat bed without using bedrails?: None Help needed moving from lying on your back to sitting on the side of a flat bed without using bedrails?: None Help needed moving to and from a bed to a chair (including a wheelchair)?: None Help needed standing up from a chair using your arms (e.g., wheelchair or bedside chair)?: None Help needed to walk in hospital room?: A Little Help needed climbing 3-5 steps with a railing? : A Little 6 Click Score: 22    End of Session Equipment Utilized During Treatment: Gait belt Activity Tolerance: Patient tolerated treatment well Patient left: in chair;with call bell/phone within reach;with chair alarm set Nurse Communication: Mobility status PT Visit Diagnosis: Muscle weakness (generalized) (M62.81)     Time: 1779-3903 PT Time Calculation (min) (ACUTE ONLY): 20 min  Charges:  $Gait Training: 8-22 mins                     Chaka Jefferys M,PT Acute Rehab Services [place] 03/11/2022, 12:47 PM

## 2022-03-11 NOTE — TOC Progression Note (Signed)
Transition of Care [facility]) - Progression Note    Patient Details  Name: Brelee Renk MRN: [id number] Date of Birth: Aug 27, 1975  Transition of Care [facility]) CM/SW Contact  Jacalyn Lefevre, Edson Snowball, RN Phone Number: 03/11/2022, 11:18 AM  Clinical Narrative:    Damaris Schooner to Amy at [facility] , referral sent for OP PT    Expected Discharge Plan: Home/Self Care Barriers to Discharge: Continued Medical Work up  Expected Discharge Plan and Services Expected Discharge Plan: Home/Self Care   Discharge Planning Services: CM Consult Post Acute Care Choice:  (OP PT) Living arrangements for the past 2 months: Single Family Home Expected Discharge Date: 03/11/22               DME Arranged:  (PAtient does NOT want a [facility])                     Social Determinants of Health (SDOH) Interventions    Readmission Risk Interventions     No data to display

## 2022-03-15 ENCOUNTER — Ambulatory Visit (INDEPENDENT_AMBULATORY_CARE_PROVIDER_SITE_OTHER): Payer: Medicaid Other | Admitting: Physician Assistant

## 2022-03-15 ENCOUNTER — Encounter: Payer: Self-pay | Admitting: Physician Assistant

## 2022-03-15 DIAGNOSIS — M793 Panniculitis, unspecified: Secondary | ICD-10-CM

## 2022-03-15 NOTE — Progress Notes (Signed)
This is a 46 year old female status post panniculectomy on 03/07/2022 by Dr. Marla Roe.  Postoperatively the patient developed a bleed with increased JP drain output and a drop in hemoglobin.  She did have a transfusion and was also seen by nephrology secondary to AKI.  She was discharged from the hospital 03/11/2022.  She notes since that time she has been doing well.  She reports she has itchiness in her abdomen and incision, she denies nausea vomiting or fever, she notes tenderness along the incision and lower abdomen.  She denies any dizziness, headedness, chest pain or shortness of breath.  She notes that her right JP drain has put out approximately 150 cc of serosanguineous output as well as 100 cc of serosanguineous output out of the left drain.  She notes she has a follow-up appoint with her nephrologist today.  She has not seen her primary care since discharge.  Patient also notes that she has not been wearing her binder as she feels that it is too tight.  Exam her abdominal incision is covered in honeycomb dressings they are intact no signs of surrounding redness, she does have ecchymosis along the lower abdomen, abdomen is soft with minimal tenderness, no fluid collections or firmness.  Bilateral JP drains are in place with serosanguineous output, drain sites are clean with no surrounding redness or discharge.   Overall the patient is progressing well.  She has significant output out of her drains we will continue to monitor this.  Her incisions remain covered by honeycomb dressing we will leave these in place until her next office visit.  The patient reports she has not been wearing her binder as she feels it is too tight.  We did instruct her that she would need to find a well fitting compressive device, I did give her recommendations for local pharmacy that carries them.  We would like to see the patient back in our office in 1 week for repeat evaluation or sooner as needed if she develops any new  or worsening signs or symptoms she will reach out immediately.  She is following up with her nephrologist today, she does need a repeat CBC, if they are not checking her hemoglobin she will reach out to our office today for further recommendations.  There was a chaperone present throughout entire exam today.

## 2022-03-17 ENCOUNTER — Encounter: Payer: Self-pay | Admitting: Plastic Surgery

## 2022-03-18 ENCOUNTER — Encounter: Payer: Medicaid Other | Admitting: Plastic Surgery

## 2022-03-22 ENCOUNTER — Encounter: Payer: Self-pay | Admitting: Plastic Surgery

## 2022-03-22 ENCOUNTER — Ambulatory Visit (INDEPENDENT_AMBULATORY_CARE_PROVIDER_SITE_OTHER): Payer: Medicaid Other | Admitting: Plastic Surgery

## 2022-03-22 DIAGNOSIS — Z9889 Other specified postprocedural states: Secondary | ICD-10-CM

## 2022-03-22 NOTE — Progress Notes (Signed)
   Subjective:    Patient ID: Dawn Thomas, female    DOB: 1976/04/06, 46 y.o.   MRN: [id number]  The patient is a 46 year old female here with her daughter for follow-up on her panniculectomy.  She is doing better.  The drains are still working.  They are putting out too much to remove today.  It is dark in color.  She has some swelling and bruising of the abdomen as expected.  She has low appetite so her protein intake has been low.  Her bowels are moving and her pain is controlled.  I do not feel a seroma or a hematoma at this time.      Review of Systems  Constitutional:  Positive for activity change and appetite change.  Eyes: Negative.   Respiratory: Negative.    Cardiovascular: Negative.        Objective:   Physical Exam Cardiovascular:     Rate and Rhythm: Normal rate.     Pulses: Normal pulses.  Skin:    Capillary Refill: Capillary refill takes less than 2 seconds.  Neurological:     Mental Status: She is alert and oriented to person, place, and time.  Psychiatric:        Mood and Affect: Mood normal.        Behavior: Behavior normal.        Thought Content: Thought content normal.        Judgment: Judgment normal.        Assessment & Plan:     ICD-10-CM   1. S/P panniculectomy  Z98.890       We will plan to see her back next week.  Hopefully we can get out one of the drains and will remove the dressing.  Call with any questions or concerns.

## 2022-03-24 ENCOUNTER — Telehealth: Payer: Self-pay

## 2022-03-24 NOTE — Telephone Encounter (Signed)
Pt called in stating that her incision area is starting to burn. She still has her honeycomb on, I told her that she could remove that because it could be irritating the skin. She does not want to do that. She has an appointment on 9/1 and wanted to wait until then. No fever, chills, redness, or drainage (just irritated looking). I told her I would pass along the information and call with any recommendations.

## 2022-03-25 NOTE — Telephone Encounter (Signed)
Called pt and l/m with recommendations and to call if still having issues.

## 2022-03-25 NOTE — Telephone Encounter (Signed)
Thanks, yes I agree if she is having burning she should remove the dressing

## 2022-03-29 ENCOUNTER — Telehealth: Payer: Self-pay | Admitting: Plastic Surgery

## 2022-03-29 NOTE — Telephone Encounter (Signed)
Pt called to reschedule due to conflict with her dtrs appt in [place] but wanted physician to be aware that her primary physician started her on an antibiotic SMZ/tmpds [id number] tab 2x a day and wanted the physician to see her test results.

## 2022-03-30 ENCOUNTER — Emergency Department (HOSPITAL_BASED_OUTPATIENT_CLINIC_OR_DEPARTMENT_OTHER)
Admission: EM | Admit: 2022-03-30 | Discharge: 2022-03-30 | Disposition: A | Discharge status: Home / Self Care | Payer: Medicaid Other | Attending: Emergency Medicine | Admitting: Emergency Medicine

## 2022-03-30 ENCOUNTER — Emergency Department (HOSPITAL_BASED_OUTPATIENT_CLINIC_OR_DEPARTMENT_OTHER): Payer: Medicaid Other

## 2022-03-30 ENCOUNTER — Encounter (HOSPITAL_BASED_OUTPATIENT_CLINIC_OR_DEPARTMENT_OTHER): Payer: Self-pay

## 2022-03-30 DIAGNOSIS — I82612 Acute embolism and thrombosis of superficial veins of left upper extremity: Secondary | ICD-10-CM | POA: Diagnosis not present

## 2022-03-30 DIAGNOSIS — I10 Essential (primary) hypertension: Secondary | ICD-10-CM | POA: Diagnosis not present

## 2022-03-30 DIAGNOSIS — J45909 Unspecified asthma, uncomplicated: Secondary | ICD-10-CM | POA: Insufficient documentation

## 2022-03-30 DIAGNOSIS — M79602 Pain in left arm: Secondary | ICD-10-CM | POA: Diagnosis present

## 2022-03-30 DIAGNOSIS — I808 Phlebitis and thrombophlebitis of other sites: Secondary | ICD-10-CM

## 2022-03-30 DIAGNOSIS — Z79899 Other long term (current) drug therapy: Secondary | ICD-10-CM | POA: Insufficient documentation

## 2022-03-30 LAB — BASIC METABOLIC PANEL
Anion gap: 7 (ref 5–15)
BUN: 19 mg/dL (ref 6–20)
CO2: 22 mmol/L (ref 22–32)
Calcium: 8.5 mg/dL — ABNORMAL LOW (ref 8.9–10.3)
Chloride: 113 mmol/L — ABNORMAL HIGH (ref 98–111)
Creatinine, Ser: 2.78 mg/dL — ABNORMAL HIGH (ref 0.44–1.00)
GFR, Estimated: 21 mL/min — ABNORMAL LOW (ref >60)
Glucose, Bld: 96 mg/dL (ref 70–99)
Potassium: 4.6 mmol/L (ref 3.5–5.1)
Sodium: 142 mmol/L (ref 135–145)

## 2022-03-30 LAB — CBC
HCT: 31.7 % — ABNORMAL LOW (ref 36.0–46.0)
Hemoglobin: 10 g/dL — ABNORMAL LOW (ref 12.0–15.0)
MCH: 29.9 pg (ref 26.0–34.0)
MCHC: 31.5 g/dL (ref 30.0–36.0)
MCV: 94.9 fL (ref 80.0–100.0)
Platelets: 262 10*3/uL (ref 150–400)
RBC: 3.34 MIL/uL — ABNORMAL LOW (ref 3.87–5.11)
RDW: 15 % (ref 11.5–15.5)
WBC: 4.8 10*3/uL (ref 4.0–10.5)
nRBC: 0 % (ref 0.0–0.2)

## 2022-03-30 MED ORDER — FENTANYL CITRATE PF 50 MCG/ML IJ SOSY
50.0000 ug | PREFILLED_SYRINGE | Freq: Once | INTRAMUSCULAR | Status: AC
  Administered 2022-03-30: 50 ug via INTRAMUSCULAR
  Filled 2022-03-30: qty 1

## 2022-03-30 NOTE — ED Provider Notes (Signed)
[facility] EMERGENCY DEPARTMENT Provider Note   CSN: [id number] Arrival date & time: 03/30/22  5916     History  Chief Complaint  Patient presents with   Arm Pain    Dawn Thomas is a 46 y.o. female with medical history significant for anemia, asthma, depression, GERD, hypertension, migraines, kidney disease, tendinitis.  Patient presents to ED for evaluation of left elbow pain.  Patient reports that on 8/7 she underwent panniculectomy at outpatient facility.  Patient reports that day of procedure, they had issues with IV placement.  The patient reports that she believes that the IV was leaking fluid into her arm as well as onto her hospital bed.  The patient states that they also had to give her a blood transfusion due to this IV issue.  Patient states that since this time on 8/7 she has had numbness and tingling in her forearm of the left side that is constant.  The patient reports that yesterday, the tingling in her arm developed into pain in her elbow.  The patient reports the pain feels like "burning and pressure".  The patient states that she is taken Tylenol for this pain which did relieve it slightly however the pain returned.  Patient denies any preceding trauma or event to account for this increased pain.  Patient is complaining of decreased range of motion of his elbow.  The patient denies any fevers, nausea, vomiting or diarrhea.   Arm Pain       Home Medications Prior to Admission medications   Medication Sig Start Date End Date Taking? Authorizing Provider  acetaminophen (TYLENOL) 500 MG tablet Take 1,000 mg by mouth every 6 (six) hours as needed for mild pain.    [provider]  albuterol (PROVENTIL HFA;VENTOLIN HFA) 108 (90 BASE) MCG/ACT inhaler Inhale 2 puffs into the lungs every 6 (six) hours as needed for wheezing or shortness of breath.     [provider]  atorvastatin (LIPITOR) 10 MG tablet Take 10 mg by mouth daily. 03/01/22   [provider]  Cyanocobalamin (B-12 PO) Take 1 tablet by mouth daily.    [provider]  ergocalciferol (VITAMIN D2) 1.25 MG (50000 UT) capsule Take 50,000 Units by mouth once a week.    [provider]  FLUoxetine (PROZAC) 10 MG capsule Take 10 mg by mouth daily. 02/13/22   [provider]  folic acid (FOLVITE) 1 MG tablet Take 1 mg by mouth daily. 12/07/21   [provider]  hydrALAZINE (APRESOLINE) 50 MG tablet Take 1 tablet (50 mg total) by mouth every 8 (eight) hours. 09/17/20 03/07/22  Werner Lean, MD  levothyroxine (SYNTHROID) 125 MCG tablet Take 125 mcg by mouth daily.    [provider]  NIFEdipine (ADALAT CC) 60 MG 24 hr tablet Take 60 mg by mouth 2 (two) times daily. 07/24/20   [provider]  ondansetron (ZOFRAN) 4 MG tablet Take 1 tablet (4 mg total) by mouth every 8 (eight) hours as needed for nausea or vomiting. 02/10/22   Scheeler, Carola Rhine, PA-C  pantoprazole (PROTONIX) 40 MG tablet Take 40 mg by mouth 2 (two) times daily. 04/27/20   [provider]  spironolactone (ALDACTONE) 25 MG tablet Take 25 mg by mouth daily.    [provider]      Allergies    Maxalt [rizatriptan benzoate], Zithromax [azithromycin], Adhesive [tape], Hydrocodone, Nsaids, Omeprazole, and Penicillins    Review of Systems   Review of Systems  Constitutional:  Negative for fever.  Gastrointestinal:  Negative for nausea and vomiting.  Musculoskeletal:  Positive for arthralgias and myalgias.  Skin:  Negative for color change.  Neurological:  Positive for numbness.  All other systems reviewed and are negative.   Physical Exam Updated Vital Signs BP (!) 150/76   Pulse 61   Temp 98.1 F (36.7 C) (Oral)   Resp 18   Ht 5' 6.5" (1.689 m)   Wt 122 kg   LMP 03/06/2022 (Exact Date)   SpO2 98%   BMI 42.77 kg/m  Physical Exam Vitals and nursing note reviewed.  Constitutional:      General: She is not in acute distress.     Appearance: Normal appearance. She is not ill-appearing, toxic-appearing or diaphoretic.  HENT:     Head: Normocephalic and atraumatic.     Nose: Nose normal. No congestion.     Mouth/Throat:     Mouth: Mucous membranes are moist.     Pharynx: Oropharynx is clear.  Eyes:     Extraocular Movements: Extraocular movements intact.     Conjunctiva/sclera: Conjunctivae normal.     Pupils: Pupils are equal, round, and reactive to light.  Cardiovascular:     Rate and Rhythm: Normal rate and regular rhythm.  Pulmonary:     Effort: Pulmonary effort is normal.     Breath sounds: Normal breath sounds. No wheezing.  Abdominal:     General: Abdomen is flat. Bowel sounds are normal.     Palpations: Abdomen is soft.     Tenderness: There is no abdominal tenderness.  Musculoskeletal:     Left shoulder: No tenderness. Normal range of motion.     Left upper arm: No swelling or tenderness.     Right elbow: Normal.     Left elbow: No swelling, deformity, effusion or lacerations. Decreased range of motion. Tenderness present.     Left forearm: Tenderness present. No swelling, deformity or lacerations.     Left wrist: No tenderness. Normal range of motion.     Cervical back: Normal range of motion and neck supple. No tenderness.     Comments: Patient left shoulder has no overlying deformity, skin change, decreased range of motion.  The patient has forage of motion of her left shoulder.  The patient left elbow does have decreased range of motion, patient can only flex to 90 degrees, can only extend to 45 degrees.  Patient able to passively allow me to range her elbow however it is very painful.  No overlying skin change, appearance of effusion.  Patient has 3/5 grip strength in left hand, 2+ radial pulse, less than 2-second cap refill.  Patient has full sensation of hand.  Skin:    General: Skin is warm and dry.     Capillary Refill: Capillary refill takes less than 2 seconds.  Neurological:     Mental  Status: She is alert and oriented to person, place, and time.     ED Results / Procedures / Treatments   Labs (all labs ordered are listed, but only abnormal results are displayed) Labs Reviewed  CBC - Abnormal; Notable for the following components:      Result Value   RBC 3.34 (*)    Hemoglobin 10.0 (*)    HCT 31.7 (*)    All other components within normal limits  BASIC METABOLIC PANEL - Abnormal; Notable for the following components:   Chloride 113 (*)    Creatinine, Ser 2.78 (*)    Calcium 8.5 (*)  GFR, Estimated 21 (*)    All other components within normal limits    EKG None  Radiology US Venous Img Upper Left (DVT Study)  Result Date: 03/30/2022 CLINICAL DATA:  Left upper extremity pain. EXAM: Left UPPER EXTREMITY VENOUS DOPPLER ULTRASOUND TECHNIQUE: Gray-scale sonography with graded compression, as well as color Doppler and duplex ultrasound were performed to evaluate the upper extremity deep venous system from the level of the subclavian vein and including the jugular, axillary, basilic, radial, ulnar and upper cephalic vein. Spectral Doppler was utilized to evaluate flow at rest and with distal augmentation maneuvers. COMPARISON:  None Available. FINDINGS: Contralateral Subclavian Vein: Respiratory phasicity is normal and symmetric with the symptomatic side. No evidence of thrombus. Normal compressibility. Internal Jugular Vein: No evidence of thrombus. Normal compressibility, respiratory phasicity and response to augmentation. Subclavian Vein: No evidence of thrombus. Normal compressibility, respiratory phasicity and response to augmentation. Axillary Vein: No evidence of thrombus. Normal compressibility, respiratory phasicity and response to augmentation. Cephalic Vein: Noncompressible consistent with occlusive thrombus. Basilic Vein: No evidence of thrombus. Normal compressibility, respiratory phasicity and response to augmentation. Brachial Veins: No evidence of thrombus.  Normal compressibility, respiratory phasicity and response to augmentation. Radial Veins: No evidence of thrombus. Normal compressibility, respiratory phasicity and response to augmentation. Ulnar Veins: No evidence of thrombus. Normal compressibility, respiratory phasicity and response to augmentation. Venous Reflux:  None visualized. Other Findings:  None visualized. IMPRESSION: Occlusive thrombus is noted within the left cephalic vein consistent with superficial thrombophlebitis. No evidence of deep venous thrombosis within the left upper extremity. Electronically Signed   By: Marijo Conception M.D.   On: 03/30/2022 12:33   DG Elbow Complete Left  Result Date: 03/30/2022 CLINICAL DATA:  Left elbow pain, decreased range of motion EXAM: LEFT ELBOW - COMPLETE 3+ VIEW COMPARISON:  None Available. FINDINGS: Frontal, bilateral oblique, lateral views of the left elbow are obtained. No acute fracture, subluxation, or dislocation. Joint spaces are well preserved. No joint effusion. Soft tissues are normal. IMPRESSION: 1. Unremarkable left elbow. Electronically Signed   By: Randa Ngo M.D.   On: 03/30/2022 10:06    Procedures Procedures   Medications Ordered in ED Medications  fentaNYL (SUBLIMAZE) injection 50 mcg (50 mcg Intramuscular Given 03/30/22 1420)    ED Course/ Medical Decision Making/ A&P                           Medical Decision Making  46 year old female presents to the ED for evaluation.  Please see HPI for further details.  On examination the patient is afebrile and nontachycardic.  Patient lung sounds clear bilaterally, not hypoxic.  Patient abdomen soft and compressible.  Patient left elbow has decreased ability to flex and extend.  This is secondary to pain.  The patient has no decreased range of motion to her left wrist, left shoulder.  There is no overlying skin change throughout the entire left upper extremity.  Patient has 2+ radial pulse, less than 2-second capillary fill.   Patient neurovascular intact.  Patient worked up utilizing the following imaging studies interpreted by me personally: - Plain film imaging of elbow shows no soft tissue swelling effusion - Ultrasound of left upper extremity shows occlusive thrombus of the cephalic vein superficially.  There is no evidence of DVT.  Myself and my attending, Dr. Pearline Cables, or receiving conflicting information on whether or not to put this patient on anticoagulation.  Dr. Roselie Awkward of vascular was consulted due  to the patient decreased renal function, question about anticoagulation.  Dr. Roselie Awkward stated that this patient did not need to follow-up in the office, she did not need to be placed on oral anticoagulation.  Dr. Roselie Awkward advised symptomatic care at home.  At this time, the patient was discharged home and advised to follow-up with her PCP.  The patient will be advised to elevate her arm, utilize NSAIDs, utilize heat therapy to relieve pain.  The patient was counseled on return precautions and she voiced understanding.  The patient had all of her questions answered to her satisfaction prior to discharge.  Patient stable for discharge.  Final Clinical Impression(s) / ED Diagnoses Final diagnoses:  Superficial thrombophlebitis of left upper extremity    Rx / DC Orders ED Discharge Orders     None         Azucena Cecil, PA-C 22/48/25 0037    Campbell Stall P, DO 04/88/89 1540

## 2022-03-30 NOTE — ED Triage Notes (Signed)
States had panniculectomy procedure on 08/7, states had IV in left [facility], has had tingling since then. Started having pain with movement from bicep down to wrist since yesterday.  Adds that she needed blood transfusion, states they were having issues with the IV that was in Left AC.

## 2022-03-30 NOTE — Discharge Instructions (Addendum)
Please return to the ED with any new or worsening signs or symptoms such as chest pain, shortness of breath Please read the attached guide concerning thrombophlebitis Please treat your symptoms at home.  He may utilize Tylenol for pain, heating pads to the elbow, elevation Please pick up pain medication I sent in for you.

## 2022-03-31 ENCOUNTER — Ambulatory Visit (INDEPENDENT_AMBULATORY_CARE_PROVIDER_SITE_OTHER): Payer: Medicaid Other | Admitting: Surgical

## 2022-03-31 DIAGNOSIS — Z9889 Other specified postprocedural states: Secondary | ICD-10-CM

## 2022-03-31 NOTE — Progress Notes (Signed)
Patient is a 46 year old female here for follow-up after panniculectomy with Dr. Marla Roe on 03/07/2022.  Her postoperative course was complicated by acute blood loss anemia due to postoperative bleed.  She was subsequently discharged from the hospital on 03/11/2022.  She reports that overall she is doing well today.  She does report some complications with her left arm due to an IV, reports she was in the emergency room yesterday and had an ultrasound of her left arm which was positive for occlusive thrombus in the left cephalic vein consistent with superficial thrombophlebitis.  No evidence of deep venous thrombosis within the left upper extremity.  She does report that she spoke with her PCP about this and was recommended to start ASA 81 mg daily.  She reports that she was at her PCP recently and had a culture of the right JP drain insertion site and was placed on Bactrim due to positive culture for corynebacterium, Staphylococcus epidermidis and Pseudomonas aeruginosa.  Susceptibilities were not provided.  Patient denies any infectious symptoms, denies any chest pain or shortness of breath.  Denies any significant lower extremity swelling.  She is not having any nausea or vomiting, fevers or chills.  She reports she has been having approximately 75 cc of drainage from the right drain daily and approximately 10 cc of drainage from the left JP drain daily.  Chaperone present on exam On exam honeycomb dressings are in place, foul odor is noted from the abdominal fold.  Significant drainage is noted on the dressings.  Steri-Strips are still in place, significant amount of drainage noted.  I do not appreciate any crepitus of her abdomen with palpation.  Bilateral JP drains in place.  She does have a wound of the central abdomen that is approximately 3 x 0.4 cm.  There is no surrounding erythema or cellulitic changes.  I do not appreciate any cellulitic changes of her abdomen.  No ecchymosis is noted at this  time.  She does have a significant amount of moisture within the fold of her abdomen.  No erythema surrounding the right drain JP insertion site, but there is some fibrinous exudate within the tunneling wound.  I discussed with the patient that she needs to be sure to keep the area within the fold of her abdomen dry and free from moisture to help decrease risk of infection and wound breakdown.  I discussed with the patient to avoid any strenuous activities or heavy lifting.  Continue with compressive garments.  I recommend she complete her dosing of Bactrim which was prescribed by her PCP for 14 days.  I do not see any signs of active infection today on exam.  Recommend following up in 1 week for reevaluation.  Left JP drain was removed due to low output.  Call with questions or concerns.  Recommend following up with PCP in regards to her left arm superficial thrombophlebitis.

## 2022-04-01 ENCOUNTER — Encounter: Payer: Medicaid Other | Admitting: Physician Assistant

## 2022-04-03 ENCOUNTER — Encounter: Payer: Self-pay | Admitting: Plastic Surgery

## 2022-04-05 ENCOUNTER — Telehealth: Payer: Self-pay | Admitting: Student

## 2022-04-05 NOTE — Telephone Encounter (Signed)
Patient called the clinic today regarding her drains.  I called the patient back in regards to this.  She reports she called the on-call service this weekend with concerns that her drain was falling out.  Patient was told to reinforce the drain with tape.    Today, patient states that she is starting to have some drainage around her drain site.  Patient states that the drainage that is coming out of the drain site is fairly minimal.  She reports that suction is still held on the bulb.  Patient reports that she is still putting around 50 cc of drainage output in the bulb.  Patient also states that she thinks the thread is still intact in her skin and around the drain.  I discussed with the patient that she should continue to reinforce the drain with tape and that she may put gauze around the drain site to help with the drainage that is coming out.  I discussed that she may change this gauze as needed.  I also discussed with the patient that she may support the area with ABDs so that is a little more comfortable when she puts her binder on.  Patient expressed understanding.  Patient already has an appointment for this Friday.  I discussed with the patient that she should just keep that appointment, but if she has any other issues or concerns in the meantime that she may call us.  Patient expressed understanding.

## 2022-04-08 ENCOUNTER — Ambulatory Visit (INDEPENDENT_AMBULATORY_CARE_PROVIDER_SITE_OTHER): Payer: Medicaid Other | Admitting: Student

## 2022-04-08 DIAGNOSIS — Z9889 Other specified postprocedural states: Secondary | ICD-10-CM

## 2022-04-08 NOTE — Progress Notes (Signed)
Patient is a 46 year old female here for follow-up after panniculectomy with Dr. Marla Roe on 03/07/2022.  Her postoperative course was complicated by acute blood loss anemia due to postoperative bleed.  She was subsequently discharged from the hospital on 03/11/2022.  Patient presents to the clinic today for postoperative follow-up.  Patient was last seen in the clinic on 03/31/2022.  Patient at this visit reported she was doing well.  Her left JP drain was removed.  Plan is for patient to follow-up in 1 week.  Patient called earlier this week with concerns about drainage around her drain site.  Patient was instructed to reinforce the drain and put gauze around the drain site for the drainage.  Today, patient reports she is doing well.  She states that her drain has stopped draining around the drain site.  Patient reports that there is been about 50 cc in total for the past 3 days in her drain.  Patient denies any other issues at this time.  She denies any fevers or chills.  She states that she is eating and drinking without issue.  She states she is having bowel movements and voiding without issue.  Chaperone present on exam.  On exam, patient sitting upright in no acute distress.  Abdomen is soft.  There is an area of firmness to the right lower quadrant of the abdomen, that appears to be consistent with fat necrosis.  There is a foul odor upon examining the abdominal fold.  There is a small superficial wound to the midline of the incision.  Incision is otherwise intact.  There is no surrounding erythema.  Her JP drain is in place and functioning to the right abdomen.  There is approximately 35 cc of serous/somewhat cloudy drainage in the bulb.  Dr. Marla Roe also had the opportunity to examine the patient.   We discussed with the patient that she should keep her abdominal fold area and her incision clean dry and intact.  I discussed with the patient that if she does experience drainage around her drain  site, that she can place gauze in the area and change once the gauze or soiled.  Patient expressed understanding.  I discussed with the patient that she should gently massage the area of firmness to her right abdomen.  Patient to follow-up next week for possible drain removal and evaluation.

## 2022-04-11 ENCOUNTER — Ambulatory Visit (INDEPENDENT_AMBULATORY_CARE_PROVIDER_SITE_OTHER): Payer: Medicaid Other | Admitting: Surgical

## 2022-04-11 DIAGNOSIS — Z9889 Other specified postprocedural states: Secondary | ICD-10-CM

## 2022-04-11 NOTE — Progress Notes (Signed)
Patient is a 46 year old female here for follow-up after panniculectomy with Dr. Marla Roe on 03/07/2022.  She is just over 1 month postop.  She reports the right JP drain has had minimal output over the past 24 hours, approximately 5 cc.  She is not having any infectious symptoms.  She reports she has been seen her nephrologist for follow-up in regards to her elevated creatinine and drop in hemoglobin after surgery. She feels that this is going well, denies any dizziness, weakness.  Chaperone present on exam On exam abdominal incision is intact, healing well.  There is no erythema or cellulitic changes noted.  Right JP drain is in place with approximately 5 cc of drainage.  No subcutaneous fluid collection noted palpation.  Minimal tenderness with palpation of abdomen.  No wounds are noted.  Recommend continue with compressive garments 24/7 for 2 more weeks, avoid heavy lifting or strenuous activities. 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 Recommend following up in 3 weeks for reevaluation.  Right JP drain was removed, patient tolerated it well.  There is no sign of infection on exam.

## 2022-04-21 ENCOUNTER — Ambulatory Visit (INDEPENDENT_AMBULATORY_CARE_PROVIDER_SITE_OTHER): Payer: Medicaid Other | Admitting: Student

## 2022-04-21 DIAGNOSIS — Z9889 Other specified postprocedural states: Secondary | ICD-10-CM

## 2022-04-21 NOTE — Progress Notes (Signed)
Patient is a 46 year old female here for follow-up after panniculectomy with Dr. Marla Roe on 03/07/2022.  Patient presents to the clinic today for concern for swelling to the right side of her abdomen.  Patient was last seen in the clinic on 04/11/2022.  At this visit, patient did not have any infectious symptoms.  On exam, her right drain was putting out minimal drainage.  Her right drain was removed without issue.  Plan is for patient to follow-up in 3 weeks.  Today, patient reports she is doing well.  She states that since she had the drain removed on the right side, she feels the right side is more swollen than the left side.  She denies any overlying erythema or warmth.  She denies any fevers or chills.  She denies any nausea or vomiting.  Patient reports that her drain site still has some drainage, and that she has had to use a Band-Aid to help catch the drainage.  Patient denies any other issues or concerns.  Chaperone present on exam.  On exam, patient has in no acute distress.  Upper abdomen and left abdomen are soft and nontender to palpation.  There is some swelling noted to the right side of the abdomen slightly more than the left.  The right lower abdomen is soft, there is an area of firmness that appears consistent with fat necrosis.  Very minimal tenderness to palpation.  There is not an obvious fluid collection palpated on exam.  Lower abdominal incision is intact and healed well.  There are no wounds noted to the incision.  There is a small superficial wound at the drain site.  There is no surrounding erythema or drainage noted at time of exam.  Discussed with the patient that she should continue compression at all times.  Patient expressed understanding.  I discussed with the patient that she should continue to monitor the area, and follow-up with [place] in 2 weeks.  I discussed with the patient the possibility of an ultrasound, and told her that we would reevaluate in 2 weeks.  I instructed  the patient to continue to monitor the area.  I told the patient that if the swelling were to worsen, the area becomes red, if it becomes warm or if she develops fevers or chills, or if she has concerns, to call us.  I discussed with the patient that she should call us if she has any questions or concerns related to the area.  I instructed the patient to put Vaseline and gauze over her drain site daily.  Patient to follow-up in 2 weeks.

## 2022-05-03 ENCOUNTER — Ambulatory Visit: Payer: Medicaid Other | Admitting: Surgical

## 2022-05-04 ENCOUNTER — Ambulatory Visit: Payer: Medicaid Other | Admitting: Student

## 2022-05-05 ENCOUNTER — Ambulatory Visit (INDEPENDENT_AMBULATORY_CARE_PROVIDER_SITE_OTHER): Payer: Medicaid Other | Admitting: Student

## 2022-05-05 ENCOUNTER — Encounter: Payer: Self-pay | Admitting: Student

## 2022-05-05 DIAGNOSIS — Z9889 Other specified postprocedural states: Secondary | ICD-10-CM

## 2022-05-05 NOTE — Progress Notes (Signed)
Patient is a 46 year old female here for follow-up after panniculectomy with Dr. Marla Roe on 03/07/2022.  Patient presents to the clinic today for follow-up.  Patient was last seen in the clinic on 04/21/2022.  At this visit, patient reported she was doing well.  She reported that since her drain has been removed on the right side, she felt her right side was a little more swollen than the left.  She denied any infectious symptoms.  On exam, there is a little swelling noted to the right side of the abdomen slightly more than the left.  There is an area of firmness that appear to be consistent with fat necrosis.  There is no obvious fluid collection palpated on exam.  Plan was for patient to continue compression at all times, to closely monitor the area.  Plan was for patient to follow-up in 2 weeks to reevaluate her.  Today, patient reports she is doing well.  She states overall she is very happy with her results.  She states that she feels her swelling to the right side of her abdomen has significantly improved.  She states that she is no longer draining from the previous right drain site.  Patient reports she also feels the area of firmness has softened up.  She denies any fevers or chills.  She denies any redness over the area.  She states that she has been eating and drinking without issue.  She denies any nausea or vomiting.  Chaperone present on exam.  On exam, patient is sitting upright in no acute distress.  Her abdomen is soft and nontender to palpation.  Swelling appears improved from previous exam.  There is no overlying erythema or ecchymosis.  There is a small area of firmness to the right lower abdomen that appears to be consistent with fat necrosis.  This has improved from previous exam.  There are no significant fluid collections palpated on exam.  Incision appears to have healed well and is intact.  There is no drainage from the previous drain sites noted on exam.  I discussed with the  patient that she should continue compression at all times and to continue to gently massage the area of firmness.  I discussed with her to continue to monitor the area, and if the area becomes red, swollen or if she has concerns she should let us know.  Patient expressed understanding.  Instructed patient to call if she has any questions or concerns.  Patient to follow-up in 2 weeks.  Pictures were obtained of the patient and placed in the chart with the patient's or guardian's permission.

## 2022-05-19 ENCOUNTER — Ambulatory Visit (INDEPENDENT_AMBULATORY_CARE_PROVIDER_SITE_OTHER): Payer: Medicaid Other | Admitting: Physician Assistant

## 2022-05-19 DIAGNOSIS — Z9889 Other specified postprocedural states: Secondary | ICD-10-CM

## 2022-05-19 NOTE — Progress Notes (Signed)
Patient is a pleasant 46 year old female here for follow-up after panniculectomy with Dr. Marla Roe on 03/07/2022.  Patient presents to the clinic today for follow-up.  Patient was last seen in the clinic on 05/05/2022.  At this visit, she reported she was doing well.  She stated that the swelling to the right side of her abdomen had significantly improved.  On exam, there is a small area of firmness to the right lower abdomen that appears to be consistent with fat necrosis.  This was improved from the previous exam.  The incision was healed and appear to be intact.  There is no drainage noted.  Plan is for patient to continue compression and gently massage her abdomen.  Plan is for patient to follow-up in 2 weeks.  Today, patient is doing well.  No complaints.  Denies any areas of firmness and feels as though she has healed well.  She does endorse improved low back pain since her panniculectomy.  More importantly, she states that she has not had any issues with infra pannus rashes and reports that they have completely resolved with the surgery.  She is pleased with the cosmetic outcome.  Physical exam is entirely reassuring.  Incisions have all completely healed, no evidence of hypertrophy or keloiding.  She does have prior abdominal incisions that resulted in widened, hyperpigmented scars.  Abdomen is soft, nontender.  No areas of firmness palpated.  Emphasized the importance of scar mitigation treatments to the excision repair site.  There are no wounds and she has completely healed over.  No restrictions at this time.  She no longer needs to wear her abdominal binder, compressive garments no longer required.  Follow-up only as needed.  Picture(s) obtained of the patient and placed in the chart were with the patient's or guardian's permission.

## 2022-05-19 NOTE — Progress Notes (Deleted)
Patient is a 46 year old female here for follow-up after panniculectomy with Dr. Marla Roe on 03/07/2022.  Patient presents to the clinic today for follow-up.  Patient was last seen in the clinic on 05/05/2022.  At this visit, she reported she was doing well.  She stated that the swelling to the right side of her abdomen had significantly improved.  On exam, there is a small area of firmness to the right lower abdomen that appears to be consistent with fat necrosis.  This was improved from the previous exam.  The incision was healed and appear to be intact.  There is no drainage noted.  Plan is for patient to continue compression and gently massage her abdomen.  Plan is for patient to follow-up in 2 weeks.  Today,

## 2022-08-16 ENCOUNTER — Institutional Professional Consult (permissible substitution): Payer: Medicaid Other | Admitting: Plastic Surgery

## 2022-09-08 ENCOUNTER — Ambulatory Visit: Payer: Medicaid Other | Attending: Internal Medicine | Admitting: Internal Medicine

## 2022-11-16 ENCOUNTER — Telehealth: Payer: Self-pay | Admitting: Plastic Surgery

## 2022-11-16 NOTE — Telephone Encounter (Signed)
LVM and My chart message of time change for appt.  Moved from 10:00 to 8:30 due to provider will not be in the office.  To please contact office if this time does not work for her to reschedule.

## 2022-11-17 ENCOUNTER — Telehealth: Payer: Self-pay | Admitting: Plastic Surgery

## 2022-11-17 NOTE — Telephone Encounter (Signed)
Received voicemail that pt can not come in at 8:30 on Friday 4/19 and called pt back and lvm to call office to r/s appt.

## 2022-11-18 ENCOUNTER — Institutional Professional Consult (permissible substitution): Payer: Medicaid Other | Admitting: Plastic Surgery

## 2023-03-23 ENCOUNTER — Emergency Department (HOSPITAL_BASED_OUTPATIENT_CLINIC_OR_DEPARTMENT_OTHER): Payer: 59

## 2023-03-23 ENCOUNTER — Other Ambulatory Visit: Payer: Self-pay

## 2023-03-23 ENCOUNTER — Emergency Department (HOSPITAL_BASED_OUTPATIENT_CLINIC_OR_DEPARTMENT_OTHER)
Admission: EM | Admit: 2023-03-23 | Discharge: 2023-03-24 | Disposition: A | Discharge status: Home / Self Care | Payer: 59 | Attending: Emergency Medicine | Admitting: Emergency Medicine

## 2023-03-23 ENCOUNTER — Encounter (HOSPITAL_BASED_OUTPATIENT_CLINIC_OR_DEPARTMENT_OTHER): Payer: Self-pay | Admitting: Emergency Medicine

## 2023-03-23 DIAGNOSIS — Y990 Civilian activity done for income or pay: Secondary | ICD-10-CM | POA: Insufficient documentation

## 2023-03-23 DIAGNOSIS — M7121 Synovial cyst of popliteal space [Baker], right knee: Secondary | ICD-10-CM | POA: Insufficient documentation

## 2023-03-23 DIAGNOSIS — W1839XA Other fall on same level, initial encounter: Secondary | ICD-10-CM | POA: Diagnosis not present

## 2023-03-23 DIAGNOSIS — R2241 Localized swelling, mass and lump, right lower limb: Secondary | ICD-10-CM | POA: Diagnosis present

## 2023-03-23 NOTE — ED Notes (Signed)
Patient transported to Ultrasound 

## 2023-03-23 NOTE — ED Triage Notes (Signed)
Right leg pain onset Monday night. Swelling started today. Recent mechanical fall at work with no apparent injuries.   Sent here from UC to r/o blood clot due to pain and warmth behind right knee. H/o blood clot in left arm. No blood thinners at this time.

## 2023-03-23 NOTE — ED Provider Notes (Signed)
[facility]  Provider Note  CSN: [id number] Arrival date & time: 03/23/23 2115  History Chief Complaint  Patient presents with   Leg Swelling    Dawn Thomas is a 47 y.o. female reports she stumbled and fell at work 4 days ago, does not think she injured herself then but began to have R knee pain that worsened over the next few days. She noticed some swelling behind the knee today prompting UC visit and then sent to the ED for ultrasound. She has had prior superficial thrombophlebitis in LUE but never required anticoagulation.    Home Medications Prior to Admission medications   Medication Sig Start Date End Date Taking? Authorizing Provider  acetaminophen (TYLENOL) 500 MG tablet Take 1,000 mg by mouth every 6 (six) hours as needed for mild pain.    [provider]  albuterol (PROVENTIL HFA;VENTOLIN HFA) 108 (90 BASE) MCG/ACT inhaler Inhale 2 puffs into the lungs every 6 (six) hours as needed for wheezing or shortness of breath.     [provider]  atorvastatin (LIPITOR) 10 MG tablet Take 10 mg by mouth daily. 03/01/22   [provider]  Cyanocobalamin (B-12 PO) Take 1 tablet by mouth daily.    [provider]  ergocalciferol (VITAMIN D2) 1.25 MG (50000 UT) capsule Take 50,000 Units by mouth once a week.    [provider]  FLUoxetine (PROZAC) 10 MG capsule Take 10 mg by mouth daily. 02/13/22   [provider]  folic acid (FOLVITE) 1 MG tablet Take 1 mg by mouth daily. 12/07/21   [provider]  hydrALAZINE (APRESOLINE) 50 MG tablet Take 1 tablet (50 mg total) by mouth every 8 (eight) hours. 09/17/20 04/08/22  Christell Constant, MD  levothyroxine (SYNTHROID) 125 MCG tablet Take 125 mcg by mouth daily.    [provider]  NIFEdipine (ADALAT CC) 60 MG 24 hr tablet Take 60 mg by mouth 2 (two) times daily. 07/24/20   [provider]  ondansetron (ZOFRAN) 4 MG tablet  Take 1 tablet (4 mg total) by mouth every 8 (eight) hours as needed for nausea or vomiting. 02/10/22   Scheeler, Kermit Balo, PA-C  pantoprazole (PROTONIX) 40 MG tablet Take 40 mg by mouth 2 (two) times daily. 04/27/20   [provider]  spironolactone (ALDACTONE) 25 MG tablet Take 25 mg by mouth daily.    [provider]     Allergies    Maxalt [rizatriptan benzoate], Wound dressing adhesive, Zithromax [azithromycin], Adhesive [tape], Hydrocodone, Nsaids, Omeprazole, and Penicillins   Review of Systems   Review of Systems Please see HPI for pertinent positives and negatives  Physical Exam BP (!) 159/81 (BP Location: Left Arm)   Pulse 79   Temp 98.5 F (36.9 C) (Oral)   Resp 20   Ht 5\' 6"  (1.676 m)   Wt 120.2 kg   LMP 03/06/2023 (Approximate)   SpO2 99%   BMI 42.77 kg/m   Physical Exam Vitals and nursing note reviewed.  Constitutional:      Appearance: Normal appearance.  HENT:     Head: Normocephalic and atraumatic.     Nose: Nose normal.     Mouth/Throat:     Mouth: Mucous membranes are moist.  Eyes:     Extraocular Movements: Extraocular movements intact.     Conjunctiva/sclera: Conjunctivae normal.  Cardiovascular:     Rate and Rhythm: Normal rate.  Pulmonary:     Effort: Pulmonary effort is  normal.     Breath sounds: Normal breath sounds.  Abdominal:     General: Abdomen is flat.     Palpations: Abdomen is soft.     Tenderness: There is no abdominal tenderness.  Musculoskeletal:        General: Swelling and tenderness (popliteal fossa and posterior calf) present. Normal range of motion.     Cervical back: Neck supple.  Skin:    General: Skin is warm and dry.  Neurological:     General: No focal deficit present.     Mental Status: She is alert.  Psychiatric:        Mood and Affect: Mood normal.     ED Results / Procedures / Treatments   EKG None  Procedures Procedures  Medications Ordered in the ED Medications - No data to  display  Initial Impression and Plan  Patient here with R posterior leg swelling and tenderness. She had a recent fall but does not think she injured her leg then. I personally viewed the images from radiology studies and agree with radiologist interpretation: [place] is negative for DVT but shows a large fluid collection in popliteal area likely a baker's cyst. Discussed this finding with the patient, will provide knee brace for comfort. Recommend elevation and ice. OTC pain meds if needed and ortho follow up. RTED for any other concerns.   ED Course       MDM Rules/Calculators/A&P Medical Decision Making Problems Addressed: Synovial cyst of right popliteal space: acute illness or injury  Amount and/or Complexity of Data Reviewed Radiology: ordered and independent interpretation performed. Decision-making details documented in ED Course.  Risk OTC drugs.     Final Clinical Impression(s) / ED Diagnoses Final diagnoses:  Synovial cyst of right popliteal space    Rx / DC Orders ED Discharge Orders     None        Pollyann Savoy, MD 03/23/23 2333

## 2023-08-17 ENCOUNTER — Other Ambulatory Visit: Payer: Self-pay

## 2023-08-17 ENCOUNTER — Emergency Department (HOSPITAL_BASED_OUTPATIENT_CLINIC_OR_DEPARTMENT_OTHER)
Admission: EM | Admit: 2023-08-17 | Discharge: 2023-08-17 | Disposition: A | Discharge status: Home / Self Care | Payer: Self-pay | Attending: Emergency Medicine | Admitting: Emergency Medicine

## 2023-08-17 ENCOUNTER — Emergency Department (HOSPITAL_BASED_OUTPATIENT_CLINIC_OR_DEPARTMENT_OTHER): Payer: Self-pay

## 2023-08-17 ENCOUNTER — Encounter (HOSPITAL_BASED_OUTPATIENT_CLINIC_OR_DEPARTMENT_OTHER): Payer: Self-pay

## 2023-08-17 DIAGNOSIS — R748 Abnormal levels of other serum enzymes: Secondary | ICD-10-CM | POA: Insufficient documentation

## 2023-08-17 DIAGNOSIS — N189 Chronic kidney disease, unspecified: Secondary | ICD-10-CM | POA: Diagnosis not present

## 2023-08-17 DIAGNOSIS — I10 Essential (primary) hypertension: Secondary | ICD-10-CM

## 2023-08-17 DIAGNOSIS — R1084 Generalized abdominal pain: Secondary | ICD-10-CM

## 2023-08-17 DIAGNOSIS — R1013 Epigastric pain: Secondary | ICD-10-CM | POA: Diagnosis present

## 2023-08-17 DIAGNOSIS — I129 Hypertensive chronic kidney disease with stage 1 through stage 4 chronic kidney disease, or unspecified chronic kidney disease: Secondary | ICD-10-CM | POA: Diagnosis not present

## 2023-08-17 DIAGNOSIS — N83209 Unspecified ovarian cyst, unspecified side: Secondary | ICD-10-CM | POA: Diagnosis not present

## 2023-08-17 LAB — CBC
HCT: 34 % — ABNORMAL LOW (ref 36.0–46.0)
Hemoglobin: 10.6 g/dL — ABNORMAL LOW (ref 12.0–15.0)
MCH: 29.4 pg (ref 26.0–34.0)
MCHC: 31.2 g/dL (ref 30.0–36.0)
MCV: 94.2 fL (ref 80.0–100.0)
Platelets: 244 10*3/uL (ref 150–400)
RBC: 3.61 MIL/uL — ABNORMAL LOW (ref 3.87–5.11)
RDW: 13.9 % (ref 11.5–15.5)
WBC: 8.4 10*3/uL (ref 4.0–10.5)
nRBC: 0 % (ref 0.0–0.2)

## 2023-08-17 LAB — COMPREHENSIVE METABOLIC PANEL
ALT: 262 U/L — ABNORMAL HIGH (ref 0–44)
AST: 599 U/L — ABNORMAL HIGH (ref 15–41)
Albumin: 3.5 g/dL (ref 3.5–5.0)
Alkaline Phosphatase: 144 U/L — ABNORMAL HIGH (ref 38–126)
Anion gap: 10 (ref 5–15)
BUN: 39 mg/dL — ABNORMAL HIGH (ref 6–20)
CO2: 22 mmol/L (ref 22–32)
Calcium: 8.1 mg/dL — ABNORMAL LOW (ref 8.9–10.3)
Chloride: 109 mmol/L (ref 98–111)
Creatinine, Ser: 3.04 mg/dL — ABNORMAL HIGH (ref 0.44–1.00)
GFR, Estimated: 18 mL/min — ABNORMAL LOW (ref >60)
Glucose, Bld: 115 mg/dL — ABNORMAL HIGH (ref 70–99)
Potassium: 3.7 mmol/L (ref 3.5–5.1)
Sodium: 141 mmol/L (ref 135–145)
Total Bilirubin: 0.6 mg/dL (ref 0.0–1.2)
Total Protein: 7 g/dL (ref 6.5–8.1)

## 2023-08-17 LAB — LIPASE, BLOOD: Lipase: 31 U/L (ref 11–51)

## 2023-08-17 LAB — LACTIC ACID, PLASMA: Lactic Acid, Venous: 1.7 mmol/L (ref 0.5–1.9)

## 2023-08-17 MED ORDER — ONDANSETRON HCL 4 MG/2ML IJ SOLN
4.0000 mg | Freq: Once | INTRAMUSCULAR | Status: AC
  Administered 2023-08-17: 4 mg via INTRAVENOUS
  Filled 2023-08-17: qty 2

## 2023-08-17 MED ORDER — LABETALOL HCL 5 MG/ML IV SOLN
20.0000 mg | Freq: Once | INTRAVENOUS | Status: AC
  Administered 2023-08-17: 20 mg via INTRAVENOUS
  Filled 2023-08-17: qty 4

## 2023-08-17 MED ORDER — HYDROMORPHONE HCL 1 MG/ML IJ SOLN
1.0000 mg | Freq: Once | INTRAMUSCULAR | Status: AC
  Administered 2023-08-17: 1 mg via INTRAVENOUS
  Filled 2023-08-17: qty 1

## 2023-08-17 NOTE — ED Triage Notes (Signed)
Pt endorses abd pain that began 2-3 days ago. Pt reports it was intermittent and is now constant. Denies N/V/D. Pt states "I cannot get warm". Pt taking mucinex and dulcolax to help the pain stop; unsure if she is constipated. Pt unable to answer other questions because she is crying.

## 2023-08-17 NOTE — ED Provider Notes (Signed)
[facility]  Provider Note  CSN: [id number] Arrival date & time: 08/17/23 0034  History Chief Complaint  Patient presents with   Abdominal Pain    Dawn Thomas is a 48 y.o. female with history of HTN, CKD, prior cholecystectomy reports 3 days of intermittent but worsening epigastric pain, severe pain tonight not improved. She denies fever, nausea, vomiting, diarrhea. Unsure if she is constipated. Has been taking ducolax and ?mucinex for the pain at home without improvement. She has not taken her BP meds today.    Home Medications Prior to Admission medications   Medication Sig Start Date End Date Taking? Authorizing Provider  acetaminophen (TYLENOL) 500 MG tablet Take 1,000 mg by mouth every 6 (six) hours as needed for mild pain.    [provider]  albuterol (PROVENTIL HFA;VENTOLIN HFA) 108 (90 BASE) MCG/ACT inhaler Inhale 2 puffs into the lungs every 6 (six) hours as needed for wheezing or shortness of breath.     [provider]  atorvastatin (LIPITOR) 10 MG tablet Take 10 mg by mouth daily. 03/01/22   [provider]  Cyanocobalamin (B-12 PO) Take 1 tablet by mouth daily.    [provider]  ergocalciferol (VITAMIN D2) 1.25 MG (50000 UT) capsule Take 50,000 Units by mouth once a week.    [provider]  FLUoxetine (PROZAC) 10 MG capsule Take 10 mg by mouth daily. 02/13/22   [provider]  folic acid (FOLVITE) 1 MG tablet Take 1 mg by mouth daily. 12/07/21   [provider]  hydrALAZINE (APRESOLINE) 50 MG tablet Take 1 tablet (50 mg total) by mouth every 8 (eight) hours. 09/17/20 04/08/22  Christell Constant, MD  levothyroxine (SYNTHROID) 125 MCG tablet Take 125 mcg by mouth daily.    [provider]  NIFEdipine (ADALAT CC) 60 MG 24 hr tablet Take 60 mg by mouth 2 (two) times daily. 07/24/20   [provider]  ondansetron (ZOFRAN) 4 MG tablet Take 1 tablet (4  mg total) by mouth every 8 (eight) hours as needed for nausea or vomiting. 02/10/22   Scheeler, Kermit Balo, PA-C  pantoprazole (PROTONIX) 40 MG tablet Take 40 mg by mouth 2 (two) times daily. 04/27/20   [provider]  spironolactone (ALDACTONE) 25 MG tablet Take 25 mg by mouth daily.    [provider]     Allergies    Maxalt [rizatriptan benzoate], Wound dressing adhesive, Zithromax [azithromycin], Adhesive [tape], Hydrocodone, Nsaids, Omeprazole, and Penicillins   Review of Systems   Review of Systems Please see HPI for pertinent positives and negatives  Physical Exam BP (!) 200/76   Pulse 76   Temp 98.3 F (36.8 C)   Resp 18   Ht 5\' 6"  (1.676 m)   Wt 120.2 kg   LMP 08/02/2023 (Exact Date)   SpO2 99%   BMI 42.77 kg/m   Physical Exam Vitals and nursing note reviewed.  Constitutional:      Appearance: Normal appearance.  HENT:     Head: Normocephalic and atraumatic.     Nose: Nose normal.     Mouth/Throat:     Mouth: Mucous membranes are moist.  Eyes:     Extraocular Movements: Extraocular movements intact.     Conjunctiva/sclera: Conjunctivae normal.  Cardiovascular:     Rate and Rhythm: Normal rate.     Comments: Pedal pulses equal and normal Pulmonary:     Effort: Pulmonary effort is normal.  Breath sounds: Normal breath sounds.  Abdominal:     General: Abdomen is flat.     Palpations: Abdomen is soft.     Tenderness: There is abdominal tenderness in the right lower quadrant and epigastric area. There is guarding. Negative signs include Murphy's sign and McBurney's sign.  Musculoskeletal:        General: No swelling. Normal range of motion.     Cervical back: Neck supple.  Skin:    General: Skin is warm and dry.  Neurological:     General: No focal deficit present.     Mental Status: She is alert.  Psychiatric:        Mood and Affect: Mood normal.     ED Results / Procedures / Treatments   EKG None  Procedures .Critical  Care  Performed by: Pollyann Savoy, MD Authorized by: Pollyann Savoy, MD   Critical care provider statement:    Critical care time (minutes):  35   Critical care time was exclusive of:  Separately billable procedures and treating other patients   Critical care was necessary to treat or prevent imminent or life-threatening deterioration of the following conditions:  Circulatory failure and renal failure   Critical care was time spent personally by me on the following activities:  Development of treatment plan with patient or surrogate, discussions with consultants, evaluation of patient's response to treatment, examination of patient, ordering and review of laboratory studies, ordering and review of radiographic studies, ordering and performing treatments and interventions, pulse oximetry, re-evaluation of patient's condition and review of old charts   Medications Ordered in the ED Medications  ondansetron (ZOFRAN) injection 4 mg (4 mg Intravenous Given 08/17/23 0114)  HYDROmorphone (DILAUDID) injection 1 mg (1 mg Intravenous Given 08/17/23 0114)  labetalol (NORMODYNE) injection 20 mg (20 mg Intravenous Given 08/17/23 0318)  ondansetron (ZOFRAN) injection 4 mg (4 mg Intravenous Given 08/17/23 0318)    Initial Impression and Plan  Patient here with nonspecific abdominal pain, noted to be hypertensive in triage. Consider SBO, mesenteric ischemia, dissection, appendicitis, perforation. Will check labs, give pain/nausea medication. Plan CT, however old labs indicate Cr >2.5 at baseline so may not be able to use contrast.  ED Course   Clinical Course as of 08/17/23 0534  Thu Aug 17, 2023  0131 CBC with mild anemia.  [CS]  0157 Lactic acid is normal.  [CS]  0231 CMP with Cr at baseline. LFTs are elevated. Lipase is normal. Will send for CT w/o contrast.  [CS]  0300 BP remains elevated despite adequate pain control and patient sleeping. Will give labetalol. CT pending oral contrast.  [CS]   0530 Patient reports she is feeling much better. BP is improved. I personally viewed the images from radiology studies and agree with radiologist interpretation: CT with signs of liver congestion which may account for her LFTs, however she also reports she was recently on a cruise and had been drinking alcohol. She hasn't had any since she returned home 10 days ago however. She is aware of incidental ovarian cyst. I discussed admission to the hospital but she states she would like to go home. She has her BP meds at home and will take them as prescribed. Avoid any alcohol. She has a PCP appointment already scheduled for 1/21. She was advised to RTED if her symptoms return or for any other concerns.  [CS]    Clinical Course User Index [CS] Pollyann Savoy, MD     MDM Rules/Calculators/A&P Medical Decision  Making Problems Addressed: Chronic kidney disease, unspecified CKD stage: chronic illness or injury Cyst of ovary, unspecified laterality: undiagnosed new problem with uncertain prognosis Elevated liver enzymes: acute illness or injury Generalized abdominal pain: acute illness or injury Primary hypertension: chronic illness or injury with exacerbation, progression, or side effects of treatment  Amount and/or Complexity of Data Reviewed Labs: ordered. Decision-making details documented in ED Course. Radiology: ordered and independent interpretation performed. Decision-making details documented in ED Course. ECG/medicine tests: ordered and independent interpretation performed. Decision-making details documented in ED Course.  Risk Prescription drug management. Decision regarding hospitalization.     Final Clinical Impression(s) / ED Diagnoses Final diagnoses:  Generalized abdominal pain  Primary hypertension  Elevated liver enzymes  Chronic kidney disease, unspecified CKD stage  Cyst of ovary, unspecified laterality    Rx / DC Orders ED Discharge Orders     None         Pollyann Savoy, MD 08/17/23 [phone]

## 2023-08-18 ENCOUNTER — Inpatient Hospital Stay (HOSPITAL_BASED_OUTPATIENT_CLINIC_OR_DEPARTMENT_OTHER)
Admission: EM | Admit: 2023-08-18 | Discharge: 2023-08-24 | DRG: 444 | Disposition: A | Discharge status: Home / Self Care | Payer: Self-pay | Attending: Internal Medicine | Admitting: Internal Medicine

## 2023-08-18 ENCOUNTER — Other Ambulatory Visit: Payer: Self-pay

## 2023-08-18 ENCOUNTER — Encounter (HOSPITAL_BASED_OUTPATIENT_CLINIC_OR_DEPARTMENT_OTHER): Payer: Self-pay | Admitting: Emergency Medicine

## 2023-08-18 DIAGNOSIS — Z79890 Hormone replacement therapy (postmenopausal): Secondary | ICD-10-CM

## 2023-08-18 DIAGNOSIS — Z91048 Other nonmedicinal substance allergy status: Secondary | ICD-10-CM

## 2023-08-18 DIAGNOSIS — I129 Hypertensive chronic kidney disease with stage 1 through stage 4 chronic kidney disease, or unspecified chronic kidney disease: Secondary | ICD-10-CM | POA: Diagnosis present

## 2023-08-18 DIAGNOSIS — E039 Hypothyroidism, unspecified: Secondary | ICD-10-CM | POA: Diagnosis present

## 2023-08-18 DIAGNOSIS — N184 Chronic kidney disease, stage 4 (severe): Secondary | ICD-10-CM | POA: Diagnosis present

## 2023-08-18 DIAGNOSIS — Z885 Allergy status to narcotic agent status: Secondary | ICD-10-CM

## 2023-08-18 DIAGNOSIS — Z9884 Bariatric surgery status: Secondary | ICD-10-CM

## 2023-08-18 DIAGNOSIS — Z6841 Body Mass Index (BMI) 40.0 and over, adult: Secondary | ICD-10-CM

## 2023-08-18 DIAGNOSIS — Z880 Allergy status to penicillin: Secondary | ICD-10-CM

## 2023-08-18 DIAGNOSIS — N189 Chronic kidney disease, unspecified: Secondary | ICD-10-CM

## 2023-08-18 DIAGNOSIS — Z881 Allergy status to other antibiotic agents status: Secondary | ICD-10-CM

## 2023-08-18 DIAGNOSIS — K759 Inflammatory liver disease, unspecified: Secondary | ICD-10-CM | POA: Diagnosis present

## 2023-08-18 DIAGNOSIS — Z8249 Family history of ischemic heart disease and other diseases of the circulatory system: Secondary | ICD-10-CM

## 2023-08-18 DIAGNOSIS — K219 Gastro-esophageal reflux disease without esophagitis: Secondary | ICD-10-CM | POA: Diagnosis present

## 2023-08-18 DIAGNOSIS — I160 Hypertensive urgency: Secondary | ICD-10-CM

## 2023-08-18 DIAGNOSIS — R1010 Upper abdominal pain, unspecified: Principal | ICD-10-CM

## 2023-08-18 DIAGNOSIS — K858 Other acute pancreatitis without necrosis or infection: Secondary | ICD-10-CM | POA: Diagnosis not present

## 2023-08-18 DIAGNOSIS — Z9049 Acquired absence of other specified parts of digestive tract: Secondary | ICD-10-CM

## 2023-08-18 DIAGNOSIS — E785 Hyperlipidemia, unspecified: Secondary | ICD-10-CM | POA: Diagnosis present

## 2023-08-18 DIAGNOSIS — Y848 Other medical procedures as the cause of abnormal reaction of the patient, or of later complication, without mention of misadventure at the time of the procedure: Secondary | ICD-10-CM | POA: Diagnosis present

## 2023-08-18 DIAGNOSIS — K9189 Other postprocedural complications and disorders of digestive system: Secondary | ICD-10-CM | POA: Diagnosis not present

## 2023-08-18 DIAGNOSIS — I10 Essential (primary) hypertension: Secondary | ICD-10-CM | POA: Diagnosis present

## 2023-08-18 DIAGNOSIS — Z79899 Other long term (current) drug therapy: Secondary | ICD-10-CM

## 2023-08-18 DIAGNOSIS — R748 Abnormal levels of other serum enzymes: Secondary | ICD-10-CM | POA: Diagnosis present

## 2023-08-18 DIAGNOSIS — R7401 Elevation of levels of liver transaminase levels: Secondary | ICD-10-CM

## 2023-08-18 DIAGNOSIS — Z888 Allergy status to other drugs, medicaments and biological substances status: Secondary | ICD-10-CM

## 2023-08-18 DIAGNOSIS — K8050 Calculus of bile duct without cholangitis or cholecystitis without obstruction: Principal | ICD-10-CM | POA: Diagnosis present

## 2023-08-18 DIAGNOSIS — Z833 Family history of diabetes mellitus: Secondary | ICD-10-CM

## 2023-08-18 DIAGNOSIS — N83209 Unspecified ovarian cyst, unspecified side: Secondary | ICD-10-CM | POA: Diagnosis present

## 2023-08-18 DIAGNOSIS — D631 Anemia in chronic kidney disease: Secondary | ICD-10-CM | POA: Diagnosis present

## 2023-08-18 DIAGNOSIS — Z886 Allergy status to analgesic agent status: Secondary | ICD-10-CM

## 2023-08-18 DIAGNOSIS — N179 Acute kidney failure, unspecified: Secondary | ICD-10-CM | POA: Diagnosis not present

## 2023-08-18 MED ORDER — HYDROMORPHONE HCL 1 MG/ML IJ SOLN
1.0000 mg | Freq: Once | INTRAMUSCULAR | Status: AC
  Administered 2023-08-19: 1 mg via INTRAVENOUS
  Filled 2023-08-18: qty 1

## 2023-08-18 MED ORDER — PANTOPRAZOLE SODIUM 40 MG IV SOLR
40.0000 mg | Freq: Once | INTRAVENOUS | Status: AC
  Administered 2023-08-19: 40 mg via INTRAVENOUS
  Filled 2023-08-18: qty 10

## 2023-08-18 MED ORDER — ONDANSETRON HCL 4 MG/2ML IJ SOLN
4.0000 mg | Freq: Once | INTRAMUSCULAR | Status: AC
  Administered 2023-08-19: 4 mg via INTRAVENOUS
  Filled 2023-08-18: qty 2

## 2023-08-18 NOTE — ED Triage Notes (Signed)
Pt c/o right-sided pain in stomach pain that radiates to her back, side and chest, was seen for same yesterday, denies n/v/d or fevers

## 2023-08-18 NOTE — ED Provider Notes (Signed)
[facility] Provider Note   CSN: [id number] Arrival date & time: 08/18/23  1849     History {Add pertinent medical, surgical, social history, OB history to HPI:1} Chief Complaint  Patient presents with   Abdominal Pain    Dawn Thomas is a 48 y.o. female.  Patient with a history of CKD, hypertension, previous cholecystectomy returns with epigastric pain that radiates to her right side and flank.  Seen for the same yesterday.  Was told her liver was inflamed that she had an ovarian cyst.  Returns today because she has severe worsening upper abdominal pain with nausea and vomiting and not able to eat or drink or keep her medications down.  The pain starts in her epigastrium radiates to her right side and mid back.  Vomiting x 2 today.  No black or bloody stools.  No fever.  No pain with urination or blood in the urine.  Denies chest pain or shortness of breath.  States she does not have follow-up with GI until February.  The history is provided by the patient.  Abdominal Pain Associated symptoms: nausea and vomiting   Associated symptoms: no cough, no dysuria, no hematuria and no shortness of breath        Home Medications Prior to Admission medications   Medication Sig Start Date End Date Taking? Authorizing Provider  acetaminophen (TYLENOL) 500 MG tablet Take 1,000 mg by mouth every 6 (six) hours as needed for mild pain.    [provider]  albuterol (PROVENTIL HFA;VENTOLIN HFA) 108 (90 BASE) MCG/ACT inhaler Inhale 2 puffs into the lungs every 6 (six) hours as needed for wheezing or shortness of breath.     [provider]  atorvastatin (LIPITOR) 10 MG tablet Take 10 mg by mouth daily. 03/01/22   [provider]  Cyanocobalamin (B-12 PO) Take 1 tablet by mouth daily.    [provider]  ergocalciferol (VITAMIN D2) 1.25 MG (50000 UT) capsule Take 50,000 Units by mouth once a week.    [provider]  FLUoxetine (PROZAC) 10 MG capsule Take 10 mg by mouth daily. 02/13/22   [provider]  folic acid (FOLVITE) 1 MG tablet Take 1 mg by mouth daily. 12/07/21   [provider]  hydrALAZINE (APRESOLINE) 50 MG tablet Take 1 tablet (50 mg total) by mouth every 8 (eight) hours. 09/17/20 04/08/22  Christell Constant, MD  levothyroxine (SYNTHROID) 125 MCG tablet Take 125 mcg by mouth daily.    [provider]  NIFEdipine (ADALAT CC) 60 MG 24 hr tablet Take 60 mg by mouth 2 (two) times daily. 07/24/20   [provider]  ondansetron (ZOFRAN) 4 MG tablet Take 1 tablet (4 mg total) by mouth every 8 (eight) hours as needed for nausea or vomiting. 02/10/22   Scheeler, Kermit Balo, PA-C  pantoprazole (PROTONIX) 40 MG tablet Take 40 mg by mouth 2 (two) times daily. 04/27/20   [provider]  spironolactone (ALDACTONE) 25 MG tablet Take 25 mg by mouth daily.    [provider]      Allergies    Maxalt [rizatriptan benzoate], Wound dressing adhesive, Zithromax [azithromycin], Adhesive [tape], Hydrocodone, Nsaids, Omeprazole, and Penicillins    Review of Systems   Review of Systems  Constitutional:  Positive for activity change and appetite change.  Respiratory:  Negative for cough, chest tightness and shortness of breath.   Gastrointestinal:  Positive for abdominal pain, nausea and vomiting.  Genitourinary:  Negative for dysuria and hematuria.  Musculoskeletal:  Positive for back pain. Negative for arthralgias and myalgias.  Skin:  Negative for rash.  Neurological:  Negative for dizziness, weakness (.ro) and headaches.   all other systems are negative except as noted in the HPI and PMH.     Physical Exam Updated Vital Signs Ht 5\' 6"  (1.676 m)   Wt 120.2 kg   LMP 08/02/2023 (Exact Date)   BMI 42.77 kg/m  Physical Exam Vitals and nursing note reviewed.  Constitutional:      General: She is not in acute distress.    Appearance: She  is well-developed.  HENT:     Head: Normocephalic and atraumatic.     Mouth/Throat:     Pharynx: No oropharyngeal exudate.  Eyes:     Conjunctiva/sclera: Conjunctivae normal.     Pupils: Pupils are equal, round, and reactive to light.  Neck:     Comments: No meningismus. Cardiovascular:     Rate and Rhythm: Normal rate and regular rhythm.     Heart sounds: Normal heart sounds. No murmur heard. Pulmonary:     Effort: Pulmonary effort is normal. No respiratory distress.     Breath sounds: Normal breath sounds.  Abdominal:     Palpations: Abdomen is soft.     Tenderness: There is abdominal tenderness. There is guarding. There is no rebound.     Comments: Epigastric and right upper quadrant pain, no guarding or rebound.  No lower abdominal tenderness.  Musculoskeletal:        General: No tenderness. Normal range of motion.     Cervical back: Normal range of motion and neck supple.     Comments: Equal DP pulses bilateral  Skin:    General: Skin is warm.  Neurological:     Mental Status: She is alert and oriented to person, place, and time.     Cranial Nerves: No cranial nerve deficit.     Motor: No abnormal muscle tone.     Coordination: Coordination normal.     Comments:  5/5 strength throughout. CN 2-12 intact.Equal grip strength.   Psychiatric:        Behavior: Behavior normal.     ED Results / Procedures / Treatments   Labs (all labs ordered are listed, but only abnormal results are displayed) Labs Reviewed  LIPASE, BLOOD  COMPREHENSIVE METABOLIC PANEL  CBC  URINALYSIS, ROUTINE W REFLEX MICROSCOPIC  PREGNANCY, URINE  HEPATITIS PANEL, ACUTE  ACETAMINOPHEN LEVEL  CBC WITH DIFFERENTIAL/PLATELET  TROPONIN I (HIGH SENSITIVITY)    EKG None  Radiology CT ABDOMEN PELVIS WO CONTRAST Result Date: 08/17/2023 CLINICAL DATA:  Acute, nonlocalized abdominal pain EXAM: CT ABDOMEN AND PELVIS WITHOUT CONTRAST TECHNIQUE: Multidetector CT imaging of the abdomen and pelvis was  performed following the standard protocol without IV contrast. RADIATION DOSE REDUCTION: This exam was performed according to the departmental dose-optimization program which includes automated exposure control, adjustment of the mA and/or kV according to patient size and/or use of iterative reconstruction technique. COMPARISON:  12/11/2017 FINDINGS: Lower chest: Coronary atherosclerosis seen at the RCA. Mild scarring or atelectasis in the lower lungs. Generous heart size. Hepatobiliary: No focal liver abnormality.Periportal low-density may reflect edema. There is rounding of the infrahepatic cava. Hazy density in the gallbladder fossa attributed to edema. Remote cholecystectomy with unremarkable gallbladder fossa on prior. Pancreas: Unremarkable. Spleen: Unremarkable. Adrenals/Urinary Tract: Negative adrenals. No hydronephrosis or stone. History of renal insufficiency. Bilateral renal cortical thinning and lobulation. Unremarkable bladder. Stomach/Bowel: Moderate stool without impaction  or obstructive changes. No bowel wall thickening. Postoperative stomach. Vascular/Lymphatic: No acute vascular abnormality. No mass or adenopathy. Reproductive:Interval cystic density enlargement the left ovary measuring up to 5.2 cm anterior to posterior on sagittal reformats, internal architecture possibly septated but limited without contrast. Other: No ascites or pneumoperitoneum. Musculoskeletal: No acute abnormalities. L5-S1 disc narrowing and endplate degeneration with ridging. Moderate foraminal narrowing on the left. IMPRESSION: 1. Periportal/perihepatic edema, possibly passive congestion, correlate with liver function tests. 2. Cystic enlargement of the left ovary since 2019, up to 5.2 cm. Because this lesion is not adequately characterized, prompt [place] is recommended for further evaluation. Note: This recommendation does not apply to premenarchal patients and to those with increased risk (genetic, family history, elevated  tumor markers or other high-risk factors) of ovarian cancer. Reference: JACR 2020 Feb; 17(2):248-254 Electronically Signed   By: Tiburcio Pea M.D.   On: 08/17/2023 05:22    Procedures Procedures  {Document cardiac monitor, telemetry assessment procedure when appropriate:1}  Medications Ordered in ED Medications  HYDROmorphone (DILAUDID) injection 1 mg (has no administration in time range)  ondansetron (ZOFRAN) injection 4 mg (has no administration in time range)  pantoprazole (PROTONIX) injection 40 mg (has no administration in time range)    ED Course/ Medical Decision Making/ A&P   {   Click here for ABCD2, HEART and other calculatorsREFRESH Note before signing :1}                              Medical Decision Making Amount and/or Complexity of Data Reviewed Labs: ordered. Decision-making details documented in ED Course. Radiology: ordered and independent interpretation performed. Decision-making details documented in ED Course. ECG/medicine tests: ordered and independent interpretation performed. Decision-making details documented in ED Course.  Risk Prescription drug management.  Turn visit with upper abdominal pain that radiates to her right side associate with nausea and vomiting.  Hypertensive with not taking medications at home.  Workup yesterday showed transaminitis and liver congestion and CT scan  {Document critical care time when appropriate:1} {Document review of labs and clinical decision tools ie heart score, Chads2Vasc2 etc:1}  {Document your independent review of radiology images, and any outside records:1} {Document your discussion with family members, caretakers, and with consultants:1} {Document social determinants of health affecting pt's care:1} {Document your decision making why or why not admission, treatments were needed:1} Final Clinical Impression(s) / ED Diagnoses Final diagnoses:  None    Rx / DC Orders ED Discharge Orders     None

## 2023-08-19 ENCOUNTER — Observation Stay (HOSPITAL_COMMUNITY): Payer: Self-pay

## 2023-08-19 DIAGNOSIS — N83209 Unspecified ovarian cyst, unspecified side: Secondary | ICD-10-CM

## 2023-08-19 DIAGNOSIS — N179 Acute kidney failure, unspecified: Secondary | ICD-10-CM | POA: Diagnosis not present

## 2023-08-19 DIAGNOSIS — Z79899 Other long term (current) drug therapy: Secondary | ICD-10-CM | POA: Diagnosis not present

## 2023-08-19 DIAGNOSIS — N189 Chronic kidney disease, unspecified: Secondary | ICD-10-CM

## 2023-08-19 DIAGNOSIS — Z6841 Body Mass Index (BMI) 40.0 and over, adult: Secondary | ICD-10-CM | POA: Diagnosis not present

## 2023-08-19 DIAGNOSIS — Z881 Allergy status to other antibiotic agents status: Secondary | ICD-10-CM | POA: Diagnosis not present

## 2023-08-19 DIAGNOSIS — R748 Abnormal levels of other serum enzymes: Secondary | ICD-10-CM

## 2023-08-19 DIAGNOSIS — N184 Chronic kidney disease, stage 4 (severe): Secondary | ICD-10-CM | POA: Diagnosis present

## 2023-08-19 DIAGNOSIS — D631 Anemia in chronic kidney disease: Secondary | ICD-10-CM | POA: Diagnosis not present

## 2023-08-19 DIAGNOSIS — E039 Hypothyroidism, unspecified: Secondary | ICD-10-CM | POA: Diagnosis not present

## 2023-08-19 DIAGNOSIS — R7401 Elevation of levels of liver transaminase levels: Secondary | ICD-10-CM | POA: Diagnosis present

## 2023-08-19 DIAGNOSIS — I129 Hypertensive chronic kidney disease with stage 1 through stage 4 chronic kidney disease, or unspecified chronic kidney disease: Secondary | ICD-10-CM | POA: Diagnosis not present

## 2023-08-19 DIAGNOSIS — K9189 Other postprocedural complications and disorders of digestive system: Secondary | ICD-10-CM | POA: Diagnosis not present

## 2023-08-19 DIAGNOSIS — Z79890 Hormone replacement therapy (postmenopausal): Secondary | ICD-10-CM | POA: Diagnosis not present

## 2023-08-19 DIAGNOSIS — I160 Hypertensive urgency: Secondary | ICD-10-CM | POA: Diagnosis not present

## 2023-08-19 DIAGNOSIS — K219 Gastro-esophageal reflux disease without esophagitis: Secondary | ICD-10-CM | POA: Diagnosis not present

## 2023-08-19 DIAGNOSIS — K8050 Calculus of bile duct without cholangitis or cholecystitis without obstruction: Secondary | ICD-10-CM | POA: Diagnosis not present

## 2023-08-19 DIAGNOSIS — K759 Inflammatory liver disease, unspecified: Secondary | ICD-10-CM | POA: Diagnosis not present

## 2023-08-19 DIAGNOSIS — E785 Hyperlipidemia, unspecified: Secondary | ICD-10-CM | POA: Diagnosis not present

## 2023-08-19 DIAGNOSIS — K858 Other acute pancreatitis without necrosis or infection: Secondary | ICD-10-CM | POA: Diagnosis not present

## 2023-08-19 DIAGNOSIS — Y848 Other medical procedures as the cause of abnormal reaction of the patient, or of later complication, without mention of misadventure at the time of the procedure: Secondary | ICD-10-CM | POA: Diagnosis present

## 2023-08-19 LAB — CBC WITH DIFFERENTIAL/PLATELET
Abs Immature Granulocytes: 0.02 10*3/uL (ref 0.00–0.07)
Basophils Absolute: 0 10*3/uL (ref 0.0–0.1)
Basophils Relative: 0 %
Eosinophils Absolute: 0.1 10*3/uL (ref 0.0–0.5)
Eosinophils Relative: 2 %
HCT: 32.2 % — ABNORMAL LOW (ref 36.0–46.0)
Hemoglobin: 10.2 g/dL — ABNORMAL LOW (ref 12.0–15.0)
Immature Granulocytes: 0 %
Lymphocytes Relative: 29 %
Lymphs Abs: 1.9 10*3/uL (ref 0.7–4.0)
MCH: 29.5 pg (ref 26.0–34.0)
MCHC: 31.7 g/dL (ref 30.0–36.0)
MCV: 93.1 fL (ref 80.0–100.0)
Monocytes Absolute: 0.4 10*3/uL (ref 0.1–1.0)
Monocytes Relative: 6 %
Neutro Abs: 4 10*3/uL (ref 1.7–7.7)
Neutrophils Relative %: 63 %
Platelets: 227 10*3/uL (ref 150–400)
RBC: 3.46 MIL/uL — ABNORMAL LOW (ref 3.87–5.11)
RDW: 14.3 % (ref 11.5–15.5)
WBC: 6.5 10*3/uL (ref 4.0–10.5)
nRBC: 0 % (ref 0.0–0.2)

## 2023-08-19 LAB — COMPREHENSIVE METABOLIC PANEL
ALT: 476 U/L — ABNORMAL HIGH (ref 0–44)
AST: 479 U/L — ABNORMAL HIGH (ref 15–41)
Albumin: 3.3 g/dL — ABNORMAL LOW (ref 3.5–5.0)
Alkaline Phosphatase: 214 U/L — ABNORMAL HIGH (ref 38–126)
Anion gap: 9 (ref 5–15)
BUN: 34 mg/dL — ABNORMAL HIGH (ref 6–20)
CO2: 21 mmol/L — ABNORMAL LOW (ref 22–32)
Calcium: 7.9 mg/dL — ABNORMAL LOW (ref 8.9–10.3)
Chloride: 109 mmol/L (ref 98–111)
Creatinine, Ser: 3.06 mg/dL — ABNORMAL HIGH (ref 0.44–1.00)
GFR, Estimated: 18 mL/min — ABNORMAL LOW (ref >60)
Glucose, Bld: 97 mg/dL (ref 70–99)
Potassium: 4.3 mmol/L (ref 3.5–5.1)
Sodium: 139 mmol/L (ref 135–145)
Total Bilirubin: 1.1 mg/dL (ref 0.0–1.2)
Total Protein: 6.8 g/dL (ref 6.5–8.1)

## 2023-08-19 LAB — HEPATITIS PANEL, ACUTE
HCV Ab: NONREACTIVE
Hep A IgM: NONREACTIVE
Hep B C IgM: NONREACTIVE
Hepatitis B Surface Ag: NONREACTIVE

## 2023-08-19 LAB — URINALYSIS, MICROSCOPIC (REFLEX)

## 2023-08-19 LAB — URINALYSIS, ROUTINE W REFLEX MICROSCOPIC
Bilirubin Urine: NEGATIVE
Glucose, UA: NEGATIVE mg/dL
Ketones, ur: NEGATIVE mg/dL
Leukocytes,Ua: NEGATIVE
Nitrite: NEGATIVE
Protein, ur: >=300 mg/dL — AB
Specific Gravity, Urine: 1.02 (ref 1.005–1.030)
pH: 6 (ref 5.0–8.0)

## 2023-08-19 LAB — CBC
HCT: 34.6 % — ABNORMAL LOW (ref 36.0–46.0)
Hemoglobin: 10.7 g/dL — ABNORMAL LOW (ref 12.0–15.0)
MCH: 30 pg (ref 26.0–34.0)
MCHC: 30.9 g/dL (ref 30.0–36.0)
MCV: 96.9 fL (ref 80.0–100.0)
Platelets: 248 10*3/uL (ref 150–400)
RBC: 3.57 MIL/uL — ABNORMAL LOW (ref 3.87–5.11)
RDW: 14.2 % (ref 11.5–15.5)
WBC: 6.5 10*3/uL (ref 4.0–10.5)
nRBC: 0 % (ref 0.0–0.2)

## 2023-08-19 LAB — ACETAMINOPHEN LEVEL: Acetaminophen (Tylenol), Serum: <10 ug/mL — ABNORMAL LOW (ref 10–30)

## 2023-08-19 LAB — HIV ANTIBODY (ROUTINE TESTING W REFLEX): HIV Screen 4th Generation wRfx: NONREACTIVE

## 2023-08-19 LAB — TROPONIN I (HIGH SENSITIVITY)
Troponin I (High Sensitivity): 21 ng/L — ABNORMAL HIGH (ref <18)
Troponin I (High Sensitivity): 18 ng/L — ABNORMAL HIGH (ref <18)

## 2023-08-19 LAB — LIPASE, BLOOD: Lipase: 31 U/L (ref 11–51)

## 2023-08-19 LAB — CREATININE, SERUM
Creatinine, Ser: 3.21 mg/dL — ABNORMAL HIGH (ref 0.44–1.00)
GFR, Estimated: 17 mL/min — ABNORMAL LOW (ref >60)

## 2023-08-19 LAB — PREGNANCY, URINE: Preg Test, Ur: NEGATIVE

## 2023-08-19 MED ORDER — NIFEDIPINE ER OSMOTIC RELEASE 60 MG PO TB24
60.0000 mg | ORAL_TABLET | Freq: Two times a day (BID) | ORAL | Status: DC
  Administered 2023-08-19 – 2023-08-24 (×10): 60 mg via ORAL
  Filled 2023-08-19 (×10): qty 1

## 2023-08-19 MED ORDER — HYDROMORPHONE HCL 1 MG/ML IJ SOLN
0.5000 mg | Freq: Once | INTRAMUSCULAR | Status: AC | PRN
  Administered 2023-08-19: 0.5 mg via INTRAVENOUS
  Filled 2023-08-19: qty 0.5

## 2023-08-19 MED ORDER — LABETALOL HCL 5 MG/ML IV SOLN: 20.0000 mg | Freq: Once | INTRAVENOUS | Status: DC

## 2023-08-19 MED ORDER — HYDRALAZINE HCL 25 MG PO TABS
50.0000 mg | ORAL_TABLET | Freq: Once | ORAL | Status: AC
  Administered 2023-08-19: 50 mg via ORAL
  Filled 2023-08-19: qty 2

## 2023-08-19 MED ORDER — NIFEDIPINE ER OSMOTIC RELEASE 30 MG PO TB24
60.0000 mg | ORAL_TABLET | Freq: Once | ORAL | Status: AC
  Administered 2023-08-19: 60 mg via ORAL
  Filled 2023-08-19: qty 2

## 2023-08-19 MED ORDER — LABETALOL HCL 5 MG/ML IV SOLN
20.0000 mg | INTRAVENOUS | Status: DC | PRN
  Administered 2023-08-21: 20 mg via INTRAVENOUS
  Filled 2023-08-19 (×2): qty 4

## 2023-08-19 MED ORDER — POLYETHYLENE GLYCOL 3350 17 G PO PACK
17.0000 g | PACK | Freq: Every day | ORAL | Status: DC | PRN
  Administered 2023-08-23: 17 g via ORAL
  Filled 2023-08-19: qty 1

## 2023-08-19 MED ORDER — ATORVASTATIN CALCIUM 10 MG PO TABS
10.0000 mg | ORAL_TABLET | Freq: Every day | ORAL | Status: DC
  Administered 2023-08-20: 10 mg via ORAL
  Filled 2023-08-19: qty 1

## 2023-08-19 MED ORDER — ENOXAPARIN SODIUM 40 MG/0.4ML IJ SOSY
40.0000 mg | PREFILLED_SYRINGE | INTRAMUSCULAR | Status: DC
  Filled 2023-08-19: qty 0.4

## 2023-08-19 MED ORDER — ACETAMINOPHEN 325 MG PO TABS: 650.0000 mg | ORAL_TABLET | Freq: Four times a day (QID) | ORAL | Status: DC | PRN

## 2023-08-19 MED ORDER — OXYCODONE HCL 5 MG PO TABS
5.0000 mg | ORAL_TABLET | Freq: Once | ORAL | Status: AC | PRN
  Administered 2023-08-19: 5 mg via ORAL
  Filled 2023-08-19: qty 1

## 2023-08-19 MED ORDER — HYDRALAZINE HCL 20 MG/ML IJ SOLN
10.0000 mg | INTRAMUSCULAR | Status: DC | PRN
  Administered 2023-08-19 – 2023-08-22 (×3): 10 mg via INTRAVENOUS
  Filled 2023-08-19 (×3): qty 1

## 2023-08-19 MED ORDER — SODIUM CHLORIDE 0.9% FLUSH
3.0000 mL | Freq: Two times a day (BID) | INTRAVENOUS | Status: DC
  Administered 2023-08-19 – 2023-08-23 (×9): 3 mL via INTRAVENOUS

## 2023-08-19 MED ORDER — HYDRALAZINE HCL 50 MG PO TABS
50.0000 mg | ORAL_TABLET | Freq: Three times a day (TID) | ORAL | Status: DC
  Administered 2023-08-19 – 2023-08-24 (×14): 50 mg via ORAL
  Filled 2023-08-19 (×14): qty 1

## 2023-08-19 MED ORDER — HYDRALAZINE HCL 20 MG/ML IJ SOLN
5.0000 mg | Freq: Once | INTRAMUSCULAR | Status: AC
  Administered 2023-08-19: 5 mg via INTRAVENOUS
  Filled 2023-08-19: qty 1

## 2023-08-19 MED ORDER — PANTOPRAZOLE SODIUM 40 MG PO TBEC
40.0000 mg | DELAYED_RELEASE_TABLET | Freq: Two times a day (BID) | ORAL | Status: DC
  Administered 2023-08-19 – 2023-08-24 (×10): 40 mg via ORAL
  Filled 2023-08-19 (×10): qty 1

## 2023-08-19 NOTE — ED Notes (Signed)
[facility] on the floor. Paperwork given

## 2023-08-19 NOTE — ED Notes (Signed)
Re-paged for Hospitalist @ 04:32 am Still no call back , ED Doctor waiting

## 2023-08-19 NOTE — ED Notes (Signed)
Pt resting quietly in no distress, SpO2 as low as 87% on room air. Pt placed on 2L nasal cannula at this time. SpO2 increased to 97%. RT will continue to monitor and be available as needed.

## 2023-08-19 NOTE — Assessment & Plan Note (Addendum)
Remote cholecystectomy. Patient is presenting with severe episodic pain (15 min to 3 hours) in the right upper abdominal area right flank area and right chest area below the axilla X 14 days .  No relation to exertion foods deep inspiration.  No trauma reported.  Associated with LFT elevation in hepatitis pattern.  With CAT scan finding of periportal perihepatic edema.  I will perform a liver ultrasound to rule out portal venous thrombosis.  Check acute hepatitis panel.  If workup is negative I intend to proceed with an MRI.  I will also get a chest x-ray. Acetaminophen prn for pain ordered.

## 2023-08-19 NOTE — Assessment & Plan Note (Addendum)
Creatinine at baseline around 3.  Troponin elevation is chronic and likely due to her CKD.  No further workup is needed

## 2023-08-19 NOTE — Assessment & Plan Note (Signed)
Seen incidentally on the CAT scan, note above.  However this has been noted since 2019 up to 5.2 cm.  Given the chronicity of this finding, I think this is less likely to be malignant.  However I will proceed with ultrasound as advised by radiology.  For definitive risk stratification.

## 2023-08-19 NOTE — ED Notes (Signed)
RE paged Consult for Hospitalist @ 03:31 am ED Doctor still waiting for a call back!

## 2023-08-19 NOTE — Assessment & Plan Note (Signed)
Patient's blood pressure is above target.  Likely due to patient skipping her antihypertensive doses in the ER.  We will resume same.  As well as pain control.

## 2023-08-19 NOTE — ED Notes (Signed)
Report called to Murlean Hark, RN

## 2023-08-19 NOTE — ED Notes (Signed)
Report rec'd from previous RN

## 2023-08-19 NOTE — Plan of Care (Signed)
  Problem: Education: Goal: Knowledge of General Education information will improve Description Including pain rating scale, medication(s)/side effects and non-pharmacologic comfort measures Outcome: Progressing   

## 2023-08-19 NOTE — Assessment & Plan Note (Signed)
Cw. pantop

## 2023-08-19 NOTE — ED Notes (Signed)
Report given to [facility]. 

## 2023-08-19 NOTE — ED Notes (Signed)
Pt has passed the fluid challenge.

## 2023-08-19 NOTE — ED Notes (Signed)
Called [facility] for transport @ 13:37

## 2023-08-19 NOTE — H&P (Signed)
History and Physical    Patient: Dawn Thomas LKG:[id number] DOB: Jul 12, 1976 DOA: 08/18/2023 DOS: the patient was seen and examined on 08/19/2023 PCP: Daylene Katayama, PA  Patient coming from: Home [facility] ER  Chief Complaint:  Chief Complaint  Patient presents with   Abdominal Pain   HPI: Dawn Thomas is a 48 y.o. female with medical history significant of remote cholecystectomy.  Patient was in her usual state of health till about 2 weeks ago.  Patient had just returned from a cruise to the [place] well as [place]  On or around January 4, patient reports abrupt onset of right upper area abdominal versus mid back area pain radiating anteriorly.  Also involving the right side of the chest somewhat.  Episodes would last 15 minutes or so.  And resolve on their own.  Associated with nausea, rare vomiting.  Pain was severe described as crushing.  No relation to exertion food or deep breathing is identified by the patient.  No fever no diarrhea no change in urine. Pain reminds patinet of pain prior to cholecystectomy.  Patient reports having episode every 2 or 3 days.  However the last 2 episode (one on jan 14 and the other one on jan 17) lasted hours.  The most that they have lasted is 3 hours as per the patient.  Patient actually was evaluated at [facility] ER on January 16 and discharged.  And returned last evening to [facility] with similar complaints as above.  Patient is subsequently transferred to [facility] from [facility] ER for further evaluation.  Patient at this time is totally asymptomatic hungry.  Denies any shortness of breath chest pain any trauma in relation to position. Review of Systems: As mentioned in the history of present illness. All other systems reviewed and are negative. 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   Renal disorder    stage 3- Dr [facility] - Dr Lequita Halt   Renal insufficiency    Sleep apnea    mild    Tendonitis    Thyroid disease    Past Surgical History:  Procedure Laterality Date   CESAREAN SECTION     CHOLECYSTECTOMY     DILATION AND CURETTAGE OF UTERUS     LAPAROSCOPIC GASTRIC SLEEVE RESECTION N/A 05/30/2017   Procedure: LAPAROSCOPIC GASTRIC SLEEVE RESECTION WITH UPPPER ENDO AND HIATAL HERNIA REPAIR;  Surgeon: Sheliah Hatch De Blanch, MD;  Location: [facility] ORS;  Service: General;  Laterality: N/A;   PANNICULECTOMY N/A 03/07/2022   Procedure: PANNICULECTOMY WITH LIPOSUCTION;  Surgeon: Peggye Form, DO;  Location: [facility] OR;  Service: Plastics;  Laterality: N/A;  4 hours   Social History:  reports that she has never smoked. She has never used smokeless tobacco. She reports that she does not drink alcohol and does not use drugs.  Allergies  Allergen Reactions   Maxalt [Rizatriptan Benzoate] Shortness Of Breath   Wound Dressing Adhesive Dermatitis    Other Reaction(s): Other (See Comments)  Burns skin   Zithromax [Azithromycin] Other (See Comments)    "messes with my breathing"   Adhesive [Tape] Other (See Comments)    Burns skin   Hydrocodone Hives   Nsaids  Other (See Comments)    Renal insufficiency.  Other Reaction(s): Other (See Comments)  Cannot take with Renal Insufficiency., Other reaction(s): Other (See Comments), Renal insufficiency., Renal insufficiency.   Omeprazole Hives   Penicillins Hives    Family History  Problem Relation Age of Onset   Diabetes Mother    Hypertension Mother    Hypertension Father    Diabetes Father    Sudden death Father    Heart attack Neg Hx    Hyperlipidemia Neg Hx     Prior to Admission medications   Medication Sig Start Date End Date Taking? Authorizing Provider  acetaminophen (TYLENOL) 500 MG tablet Take 1,000 mg by mouth every 6  (six) hours as needed for mild pain.    [provider]  albuterol (PROVENTIL HFA;VENTOLIN HFA) 108 (90 BASE) MCG/ACT inhaler Inhale 2 puffs into the lungs every 6 (six) hours as needed for wheezing or shortness of breath.     [provider]  atorvastatin (LIPITOR) 10 MG tablet Take 10 mg by mouth daily. 03/01/22   [provider]  Cyanocobalamin (B-12 PO) Take 1 tablet by mouth daily.    [provider]  ergocalciferol (VITAMIN D2) 1.25 MG (50000 UT) capsule Take 50,000 Units by mouth once a week.    [provider]  FLUoxetine (PROZAC) 10 MG capsule Take 10 mg by mouth daily. 02/13/22   [provider]  folic acid (FOLVITE) 1 MG tablet Take 1 mg by mouth daily. 12/07/21   [provider]  hydrALAZINE (APRESOLINE) 50 MG tablet Take 1 tablet (50 mg total) by mouth every 8 (eight) hours. 09/17/20 04/08/22  Christell Constant, MD  levothyroxine (SYNTHROID) 125 MCG tablet Take 125 mcg by mouth daily.    [provider]  NIFEdipine (ADALAT CC) 60 MG 24 hr tablet Take 60 mg by mouth 2 (two) times daily. 07/24/20   [provider]  ondansetron (ZOFRAN) 4 MG tablet Take 1 tablet (4 mg total) by mouth every 8 (eight) hours as needed for nausea or vomiting. 02/10/22   Scheeler, Kermit Balo, PA-C  pantoprazole (PROTONIX) 40 MG tablet Take 40 mg by mouth 2 (two) times daily. 04/27/20   [provider]  spironolactone (ALDACTONE) 25 MG tablet Take 25 mg by mouth daily.    [provider]    Physical Exam: Vitals:   08/19/23 1318 08/19/23 1400 08/19/23 1508 08/19/23 1633  BP: (!) 190/84 (!) 186/74 (!) 181/76 (!) 189/78  Pulse: (!) 57 (!) 58 (!) 59 60  Resp: 16 14 20 20   Temp:   98.2 F (36.8 C)   TempSrc:   Oral   SpO2: (S) 100% 99% 97%   Weight:      Height:       Obese appearing lady in no distress. Respiratory exam: Bilateral intravesicular Cardiovascular exam S1-S2 normal Abdomen all quadrant soft  nontender Extremities warm without edema No focal motor deficit Patient is alert awake oriented x 3. Data Reviewed:  Labs on Admission:  Results for orders placed or performed during the hospital encounter of 08/18/23 (from the past 24 hours)  Lipase, blood     Status: None   Collection Time: 08/18/23  7:24 PM  Result Value Ref Range   Lipase 31 11 - 51 U/L  Comprehensive metabolic panel     Status: Abnormal   Collection Time: 08/18/23  7:24 PM  Result Value Ref Range   Sodium 139 135 - 145 mmol/L   Potassium 4.3 3.5 -  5.1 mmol/L   Chloride 109 98 - 111 mmol/L   CO2 21 (L) 22 - 32 mmol/L   Glucose, Bld 97 70 - 99 mg/dL   BUN 34 (H) 6 - 20 mg/dL   Creatinine, Ser 0.98 (H) 0.44 - 1.00 mg/dL   Calcium 7.9 (L) 8.9 - 10.3 mg/dL   Total Protein 6.8 6.5 - 8.1 g/dL   Albumin 3.3 (L) 3.5 - 5.0 g/dL   AST 119 (H) 15 - 41 U/L   ALT 476 (H) 0 - 44 U/L   Alkaline Phosphatase 214 (H) 38 - 126 U/L   Total Bilirubin 1.1 0.0 - 1.2 mg/dL   GFR, Estimated 18 (L) >60 mL/min   Anion gap 9 5 - 15  CBC with Differential/Platelet     Status: Abnormal   Collection Time: 08/19/23  1:43 AM  Result Value Ref Range   WBC 6.5 4.0 - 10.5 K/uL   RBC 3.46 (L) 3.87 - 5.11 MIL/uL   Hemoglobin 10.2 (L) 12.0 - 15.0 g/dL   HCT 14.7 (L) 82.9 - 56.2 %   MCV 93.1 80.0 - 100.0 fL   MCH 29.5 26.0 - 34.0 pg   MCHC 31.7 30.0 - 36.0 g/dL   RDW 13.0 86.5 - 78.4 %   Platelets 227 150 - 400 K/uL   nRBC 0.0 0.0 - 0.2 %   Neutrophils Relative % 63 %   Neutro Abs 4.0 1.7 - 7.7 K/uL   Lymphocytes Relative 29 %   Lymphs Abs 1.9 0.7 - 4.0 K/uL   Monocytes Relative 6 %   Monocytes Absolute 0.4 0.1 - 1.0 K/uL   Eosinophils Relative 2 %   Eosinophils Absolute 0.1 0.0 - 0.5 K/uL   Basophils Relative 0 %   Basophils Absolute 0.0 0.0 - 0.1 K/uL   Immature Granulocytes 0 %   Abs Immature Granulocytes 0.02 0.00 - 0.07 K/uL  Troponin I (High Sensitivity)     Status: Abnormal   Collection Time: 08/19/23  4:57 AM  Result  Value Ref Range   Troponin I (High Sensitivity) 18 (H) <18 ng/L  CBC     Status: Abnormal   Collection Time: 08/19/23  4:23 PM  Result Value Ref Range   WBC 6.5 4.0 - 10.5 K/uL   RBC 3.57 (L) 3.87 - 5.11 MIL/uL   Hemoglobin 10.7 (L) 12.0 - 15.0 g/dL   HCT 69.6 (L) 29.5 - 28.4 %   MCV 96.9 80.0 - 100.0 fL   MCH 30.0 26.0 - 34.0 pg   MCHC 30.9 30.0 - 36.0 g/dL   RDW 13.2 44.0 - 10.2 %   Platelets 248 150 - 400 K/uL   nRBC 0.0 0.0 - 0.2 %  Creatinine, serum     Status: Abnormal   Collection Time: 08/19/23  4:23 PM  Result Value Ref Range   Creatinine, Ser 3.21 (H) 0.44 - 1.00 mg/dL   GFR, Estimated 17 (L) >60 mL/min   Basic Metabolic Panel: Recent Labs  Lab 08/17/23 0119 08/18/23 1924 08/19/23 1623  NA 141 139  --   K 3.7 4.3  --   CL 109 109  --   CO2 22 21*  --   GLUCOSE 115* 97  --   BUN 39* 34*  --   CREATININE 3.04* 3.06* 3.21*  CALCIUM 8.1* 7.9*  --    Liver Function Tests: Recent Labs  Lab 08/17/23 0119 08/18/23 1924  AST 599* 479*  ALT 262* 476*  ALKPHOS 144* 214*  BILITOT  0.6 1.1  PROT 7.0 6.8  ALBUMIN 3.5 3.3*   Recent Labs  Lab 08/17/23 0119 08/18/23 1924  LIPASE 31 31   No results for input(s): "AMMONIA" in the last 168 hours. CBC: Recent Labs  Lab 08/17/23 0119 08/19/23 0143 08/19/23 1623  WBC 8.4 6.5 6.5  NEUTROABS  --  4.0  --   HGB 10.6* 10.2* 10.7*  HCT 34.0* 32.2* 34.6*  MCV 94.2 93.1 96.9  PLT 244 227 248   Cardiac Enzymes: Recent Labs  Lab 08/18/23 0143 08/19/23 0457  TROPONINIHS 21* 18*    BNP (last 3 results) No results for input(s): "PROBNP" in the last 8760 hours. CBG: No results for input(s): "GLUCAP" in the last 168 hours.  Radiological Exams on Admission:  No results found. CT 08/17/2023 IMPRESSION: 1. Periportal/perihepatic edema, possibly passive congestion, correlate with liver function tests. 2. Cystic enlargement of the left ovary since 2019, up to 5.2 cm. Because this lesion is not adequately  characterized, prompt [place] is recommended for further evaluation. Note: This recommendation does not apply to premenarchal patients and to those with increased risk (genetic, family history, elevated tumor markers or other high-risk factors) of ovarian cancer. Reference: JACR 2020 Feb; 17(2):248-254     Assessment and Plan: * Elevated liver enzymes Remote cholecystectomy. Patient is presenting with severe episodic pain (15 min to 3 hours) in the right upper abdominal area right flank area and right chest area below the axilla X 14 days .  No relation to exertion foods deep inspiration.  No trauma reported.  Associated with LFT elevation in hepatitis pattern.  With CAT scan finding of periportal perihepatic edema.  I will perform a liver ultrasound to rule out portal venous thrombosis.  Check acute hepatitis panel.  If workup is negative I intend to proceed with an MRI.  I will also get a chest x-ray. Acetaminophen prn for pain ordered.    Ovarian cyst Seen incidentally on the CAT scan, note above.  However this has been noted since 2019 up to 5.2 cm.  Given the chronicity of this finding, I think this is less likely to be malignant.  However I will proceed with ultrasound as advised by radiology.  For definitive risk stratification.  CKD (chronic kidney disease) Creatinine at baseline around 3.  Troponin elevation is chronic and likely due to her CKD.  No further workup is needed  GERD (gastroesophageal reflux disease) Cw. pantop  Essential hypertension Patient's blood pressure is above target.  Likely due to patient skipping her antihypertensive doses in the ER.  We will resume same.  As well as pain control.  DVT ppx ordered  Please follow-up home medication reconciliation after pharmacy input   Advance Care Planning:   Code Status: Full Code   Consults: none at this time.  Family Communication: per patient.  Severity of Illness: The appropriate patient status for this patient  is INPATIENT. Inpatient status is judged to be reasonable and necessary in order to provide the required intensity of service to ensure the patient's safety. The patient's presenting symptoms, physical exam findings, and initial radiographic and laboratory data in the context of their chronic comorbidities is felt to place them at high risk for further clinical deterioration. Furthermore, it is not anticipated that the patient will be medically stable for discharge from the hospital within 2 midnights of admission.   * I certify that at the point of admission it is my clinical judgment that the patient will require inpatient hospital care  spanning beyond 2 midnights from the point of admission due to high intensity of service, high risk for further deterioration and high frequency of surveillance required.*  Author: Nolberto Hanlon, MD 08/19/2023 5:30 PM  For on call review [email or web address].

## 2023-08-20 ENCOUNTER — Observation Stay (HOSPITAL_COMMUNITY): Payer: Self-pay

## 2023-08-20 ENCOUNTER — Encounter (HOSPITAL_COMMUNITY): Payer: Self-pay | Admitting: Gastroenterology

## 2023-08-20 DIAGNOSIS — R748 Abnormal levels of other serum enzymes: Secondary | ICD-10-CM | POA: Diagnosis not present

## 2023-08-20 LAB — CBC
HCT: 34 % — ABNORMAL LOW (ref 36.0–46.0)
Hemoglobin: 10.3 g/dL — ABNORMAL LOW (ref 12.0–15.0)
MCH: 29.9 pg (ref 26.0–34.0)
MCHC: 30.3 g/dL (ref 30.0–36.0)
MCV: 98.6 fL (ref 80.0–100.0)
Platelets: 228 10*3/uL (ref 150–400)
RBC: 3.45 MIL/uL — ABNORMAL LOW (ref 3.87–5.11)
RDW: 14.5 % (ref 11.5–15.5)
WBC: 5.3 10*3/uL (ref 4.0–10.5)
nRBC: 0 % (ref 0.0–0.2)

## 2023-08-20 LAB — COMPREHENSIVE METABOLIC PANEL
ALT: 380 U/L — ABNORMAL HIGH (ref 0–44)
AST: 309 U/L — ABNORMAL HIGH (ref 15–41)
Albumin: 3.1 g/dL — ABNORMAL LOW (ref 3.5–5.0)
Alkaline Phosphatase: 238 U/L — ABNORMAL HIGH (ref 38–126)
Anion gap: 6 (ref 5–15)
BUN: 37 mg/dL — ABNORMAL HIGH (ref 6–20)
CO2: 22 mmol/L (ref 22–32)
Calcium: 8 mg/dL — ABNORMAL LOW (ref 8.9–10.3)
Chloride: 110 mmol/L (ref 98–111)
Creatinine, Ser: 3.53 mg/dL — ABNORMAL HIGH (ref 0.44–1.00)
GFR, Estimated: 15 mL/min — ABNORMAL LOW (ref >60)
Glucose, Bld: 95 mg/dL (ref 70–99)
Potassium: 4.6 mmol/L (ref 3.5–5.1)
Sodium: 138 mmol/L (ref 135–145)
Total Bilirubin: 0.7 mg/dL (ref 0.0–1.2)
Total Protein: 6.4 g/dL — ABNORMAL LOW (ref 6.5–8.1)

## 2023-08-20 LAB — IRON AND TIBC
Iron: 57 ug/dL (ref 28–170)
Saturation Ratios: 17 % (ref 10.4–31.8)
TIBC: 337 ug/dL (ref 250–450)
UIBC: 280 ug/dL

## 2023-08-20 LAB — FERRITIN: Ferritin: 110 ng/mL (ref 11–307)

## 2023-08-20 LAB — PROTIME-INR
INR: 1.1 (ref 0.8–1.2)
Prothrombin Time: 13.9 s (ref 11.4–15.2)

## 2023-08-20 LAB — APTT: aPTT: 29 s (ref 24–36)

## 2023-08-20 LAB — HEPATITIS PANEL, ACUTE: Hepatitis B Surface Ag: NONREACTIVE

## 2023-08-20 LAB — SODIUM, URINE, RANDOM: Sodium, Ur: 72 mmol/L

## 2023-08-20 MED ORDER — GADOBUTROL 1 MMOL/ML IV SOLN
10.0000 mL | Freq: Once | INTRAVENOUS | Status: AC | PRN
  Administered 2023-08-20: 10 mL via INTRAVENOUS

## 2023-08-20 MED ORDER — HEPARIN SODIUM (PORCINE) 5000 UNIT/ML IJ SOLN
5000.0000 [IU] | Freq: Three times a day (TID) | INTRAMUSCULAR | Status: DC
Start: 2023-08-20 — End: 2023-08-24
  Filled 2023-08-20 (×5): qty 1

## 2023-08-20 MED ORDER — HYDROMORPHONE HCL 1 MG/ML IJ SOLN
0.5000 mg | INTRAMUSCULAR | Status: AC | PRN
Start: 2023-08-20 — End: 2023-08-20
  Administered 2023-08-20: 0.5 mg via INTRAVENOUS
  Filled 2023-08-20: qty 0.5

## 2023-08-20 MED ORDER — ALBUTEROL SULFATE (2.5 MG/3ML) 0.083% IN NEBU: 2.5000 mg | INHALATION_SOLUTION | Freq: Four times a day (QID) | RESPIRATORY_TRACT | Status: DC | PRN

## 2023-08-20 MED ORDER — LEVOTHYROXINE SODIUM 125 MCG PO TABS
125.0000 ug | ORAL_TABLET | Freq: Every day | ORAL | Status: DC
  Administered 2023-08-21 – 2023-08-24 (×3): 125 ug via ORAL
  Filled 2023-08-20 (×3): qty 1

## 2023-08-20 MED ORDER — ACETAMINOPHEN 500 MG PO TABS
500.0000 mg | ORAL_TABLET | Freq: Four times a day (QID) | ORAL | Status: DC | PRN
  Administered 2023-08-22 (×2): 500 mg via ORAL
  Filled 2023-08-20 (×2): qty 1

## 2023-08-20 NOTE — Progress Notes (Signed)
Progress Note   Patient: Dawn Thomas BBC:[id number] DOB: 10-Aug-1975 DOA: 08/18/2023     0 DOS: the patient was seen and examined on 08/20/2023   Brief hospital course: 47yo with h/o morbid obesity s/p bariatric surgery, HLD, HTN, and hypothyroidism who presented on 1/18 with abdominal pain.  She returned from a cruise to the [place] and had acute onset of pain on 1/4 with episodic pain since.  CT on 1/16 (PTA) showed periportal and perihepatic edema and cystic ovarian enlargement.  CXR on admission negative.  Pelvic US with multiple ovarian cysts.  RUQ without evidence of portal venous thrombosis and with evidence of medical renal disease.  Marked LFT elevation, marked HTN.  Assessment and Plan:  Elevated liver enzymes Remote cholecystectomy Patient is presenting with severe episodic pain (15 min to 3 hours) in the right upper abdominal area right flank area and right chest area below the axilla X 14 days No relation to exertion, foods, or deep inspiration Associated with LFT elevation, 300-400 range without significant bilirubin elevation CT finding of periportal/perihepatic edema Unremarkable RUQ [place] ultrasound  Negative acute hepatitis panel GI is consulted Needs MRCP Hold atorvastatin    Stage 4 CKD Creatinine at baseline around 3 Slightly worse than baseline ?developing hepatorenal syndrome RUS with chronic renal disease Will consult nephrology Avoid nephrotoxic agents Hold spironolactone Repeat CMP in AM  Ovarian cyst Seen incidentally on CT Pelvic US with multiple ovarian cysts, nothing particularly concerning at this time   GERD (gastroesophageal reflux disease) Continue PPI   Essential hypertension Markedly elevated BP on presentation Likely related to missed outpatient BP medications Continue hydralazine, nifedipine Hold spironlactone  HLD Hold atorvastatin in the setting of transaminitis  Hypothyroidism Continue Synthroid  Morbid  obesity Body mass index is 42.77 kg/m..  S/p bariatric surgery Ongoing weight loss should be encouraged Outpatient PCP/bariatric medicine/bariatric surgery f/u encouraged      Consultants: GI  Procedures: None  Antibiotics: None      Subjective: Reports ongoing episodic pain.  Has seen Gi and nephrology.   Objective: Vitals:   08/20/23 0431 08/20/23 1433  BP: (!) 128/58 (!) 144/67  Pulse: (!) 57 70  Resp: 16 20  Temp: 98.8 F (37.1 C) 98.5 F (36.9 C)  SpO2: 96% 98%    Intake/Output Summary (Last 24 hours) at 08/20/2023 1648 Last data filed at 08/20/2023 0430 Gross per 24 hour  Intake --  Output 900 ml  Net -900 ml   Filed Weights   08/18/23 1921  Weight: 120.2 kg    Exam:  General:  Appears calm and comfortable and is in NAD Eyes:  EOMI, normal lids, iris ENT:  grossly normal hearing, lips & tongue, mmm Neck:  no LAD, masses or thyromegaly Cardiovascular:  RRR, no m/r/g. No LE edema.  Respiratory:   CTA bilaterally with no wheezes/rales/rhonchi.  Normal respiratory effort. Abdomen:  soft, NT, ND, limited by habitus Skin:  no rash or induration seen on limited exam Musculoskeletal:  grossly normal tone BUE/BLE, good ROM, no bony abnormality Psychiatric:  blunted mood and affect, speech fluent and appropriate, AOx3 Neurologic:  CN 2-12 grossly intact, moves all extremities in coordinated fashion  Data Reviewed: I have reviewed the patient's lab results since admission.  Pertinent labs for today include:   BUN 37/Creatinine 3.52/GFR 15; 39/3.04/18 on 1/16; baseline creatinine 2.54 in 05/2022 AP 238; 144 on 1/16 Albumin 3.1 AST 309/ALT 380; 599/262 on 1/16 WBC 5.3 Hgb 10.3 Acute hepatitis panel negative  HIV  negative   Family Communication: None present  Disposition: Status is: Observation The patient remains OBS appropriate and will d/c before 2 midnights.     Time spent: 50 minutes  Unresulted Labs (From admission, onward)      Start     Ordered   08/21/23 0500  Basic metabolic panel  Daily,   R      08/20/23 0821   08/21/23 0500  CBC with Differential  Tomorrow morning,   R        08/20/23 1648   08/21/23 0500  Hepatic function panel  Daily,   R      08/20/23 1648             Author: Jonah Blue, MD 08/20/2023 4:48 PM  For on call review [email or web address].

## 2023-08-20 NOTE — Hospital Course (Signed)
47yo with h/o morbid obesity s/p bariatric surgery, HLD, HTN, and hypothyroidism who presented on 1/18 with abdominal pain.  She returned from a cruise to the [place] and had acute onset of pain on 1/4 with episodic pain since.  CT on 1/16 (PTA) showed periportal and perihepatic edema and cystic ovarian enlargement.  CXR on admission negative.  Pelvic US with multiple ovarian cysts.  RUQ without evidence of portal venous thrombosis and with evidence of medical renal disease.  Marked LFT elevation, marked HTN.

## 2023-08-20 NOTE — Plan of Care (Signed)

## 2023-08-20 NOTE — Consult Note (Signed)
Nephrology Consult   Requesting provider: Jonah Blue Service requesting consult: Hospitalist Reason for consult: AKI on CKD IV   Assessment/Recommendations: Dawn Thomas is a/an 48 y.o. female with a past medical history obesity, hypertension, CKD 4 who present w/ elevated LFTs complicated by AKI  Nonoliguric AKI on CKD 4: Baseline creatinine around 3.  Creatinine today 3.5.  Mild AKI not fully unexpected in the setting of acute illness.  Appears euvolemic at this time. -Continue supportive care -Agree with holding spironolactone -Continue to monitor daily Cr, Dose meds for GFR -Monitor Daily I/Os, Daily weight  -Maintain MAP>65 for optimal renal perfusion.  -Avoid nephrotoxic medications including NSAIDs -Use synthetic opioids (Fentanyl/Dilaudid) if needed -Renal ultrasound with chronic kidney disease -No indication for dialysis  Elevated LFTs: Some form of hepatitis.  Unclear cause.  Could be viral.  Workup and management per primary team.  Pain control per primary team  Hypertension: Blood pressure improved with home medications.  Holding spironolactone.  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  Followed by [facility]. Hematuria but on her menstral cycle. Consider repeat inpatient if GFR worsens and GN work up. Otherwise defer to outpatient setting  Anemia: obtain iron studies. Transfuse as needed   Recommendations conveyed to primary service.    [facility] 08/20/2023 10:37 AM   _____________________________________________________________________________________ CC: Abdominal pain  History of Present Illness: Dawn Thomas is a/an 48 y.o. female with a past medical history of hypertension, obesity, CKD 4 who presents with abdominal pain.  Patient states she was fine until about a couple weeks ago she came back from a cruise and had worsening abdominal pain.  This is  really worsened over the past week.  Mainly epigastric but sometimes goes into the lower abdomen as well as the chest and radiates to the back.  Mostly on the right side.  This will come and go with certain episodes lasting about 15 minutes.  Some associated with eating food.  Has had some nausea.  Only 1 episode of vomiting.  Denies any hematuria but is on her period right now.  No dysuria.  Denies fevers or chills.  No chest pain.  No shortness of breath but is splinting some because of the pain.  Denies NSAID use in the emergency department has been noted to have elevated LFTs the last few days but these seem to be improving.    Patient follows with Dr. Lequita Halt last seen in July.  At that time creatinine was around 2.8.  However it seems baseline is now closer to 3.  Creatinine was 3 on arrival and now up to 3.5 today.  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  She does have some proteinuria and hematuria on urinalysis today.  She admits to being on her period at this time.   Medications:  Current Facility-Administered Medications  Medication Dose Route Frequency Provider Last Rate Last Admin   acetaminophen (TYLENOL) tablet 500 mg  500 mg Oral Q6H PRN Jonah Blue, MD       atorvastatin (LIPITOR) tablet 10 mg  10 mg Oral Daily Nolberto Hanlon, MD   10 mg at 08/20/23 0806   heparin injection 5,000 Units  5,000 Units Subcutaneous Bobette Mo, MD       hydrALAZINE (APRESOLINE) injection 10 mg  10 mg Intravenous Q4H PRN Nolberto Hanlon, MD   10 mg at 08/19/23 1614   hydrALAZINE (APRESOLINE) tablet 50 mg  50 mg Oral Q8H  Nolberto Hanlon, MD   50 mg at 08/20/23 0553   labetalol (NORMODYNE) injection 20 mg  20 mg Intravenous Q2H PRN Nolberto Hanlon, MD       NIFEdipine (PROCARDIA XL/NIFEDICAL XL) 24 hr tablet 60 mg  60 mg Oral BID Nolberto Hanlon, MD   60 mg at 08/20/23 0806   pantoprazole (PROTONIX) EC tablet 40 mg  40 mg Oral BID Nolberto Hanlon, MD   40 mg at 08/20/23 1610   polyethylene glycol  (MIRALAX / GLYCOLAX) packet 17 g  17 g Oral Daily PRN Nolberto Hanlon, MD       sodium chloride flush (NS) 0.9 % injection 3 mL  3 mL Intravenous Q12H Nolberto Hanlon, MD   3 mL at 08/20/23 0806     ALLERGIES Maxalt [rizatriptan benzoate], Wound dressing adhesive, Zithromax [azithromycin], Adhesive [tape], Hydrocodone, Nsaids, Omeprazole, and Penicillins  MEDICAL HISTORY 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   Renal disorder    stage 3- [facility] - Dr Lequita Halt   Renal insufficiency    Sleep apnea    mild    Tendonitis    Thyroid disease      SOCIAL HISTORY Social History   Socioeconomic History   Marital status: Single    Spouse name: Not on file   Number of children: 2   Years of education: Not on file   Highest education level: Not on file  Occupational History   Not on file  Tobacco Use   Smoking status: Never   Smokeless tobacco: Never  Vaping Use   Vaping status: Never Used  Substance and Sexual Activity   Alcohol use: No   Drug use: No   Sexual activity: Not on file  Other Topics Concern   Not on file  Social History Narrative   Not on file   Social Drivers of Health   Financial Resource Strain: Low Risk  (12/14/2021)   [facility] prior to 10/01/2022[facility]   Overall Financial Resource Strain (CARDIA)    Difficulty of Paying Living Expenses: Not very hard  Food Insecurity: No Food Insecurity (08/19/2023)   Hunger Vital Sign    Worried About Running Out of Food in the Last Year: Never true    Ran Out of Food in the Last Year: Never true  Transportation Needs: No Transportation Needs (08/19/2023)   PRAPARE  - Administrator, Civil Service (Medical): No    Lack of Transportation (Non-Medical): No  Physical Activity: Inactive (12/14/2021)   [facility], [facility] visits prior to 10/01/2022., [facility] visits prior to 10/01/2022., [facility]   Exercise Vital Sign    Days of Exercise per Week: 0 days    Minutes of Exercise per Session: 0 min  Stress: Stress Concern Present (12/14/2021)   [facility] prior to 10/01/2022[facility] visits prior to 10/01/2022., [facility]   [employer] of Occupational Health - Occupational Stress Questionnaire    Feeling of Stress :  Rather much  Social Connections: Moderately Isolated (12/14/2021)   [facility], [facility] prior to 10/01/2022., [facility] visits prior to 10/01/2022., [facility]   Social Connection and Isolation Panel [NHANES]    Frequency of Communication with Friends and Family: More than three times a week    Frequency of Social Gatherings with Friends and Family: Once a week    Attends Religious Services: 1 to 4 times per year    Active Member of [employer] or Organizations: No    Attends Banker Meetings: Patient declined    Marital Status: Never married  Intimate Partner Violence: Not At Risk (08/19/2023)   Humiliation, Afraid, Rape, and Kick questionnaire    Fear of Current or Ex-Partner: No    Emotionally Abused: No    Physically Abused: No    Sexually Abused: No     FAMILY HISTORY Family History  Problem Relation Age of Onset   Diabetes Mother    Hypertension Mother    Hypertension Father    Diabetes Father    Sudden death Father    Heart attack Neg Hx    Hyperlipidemia Neg Hx       Review of Systems: 12 systems reviewed Otherwise as per HPI, all other systems reviewed and negative  Physical  Exam: Vitals:   08/19/23 2226 08/20/23 0431  BP: (!) 181/73 (!) 128/58  Pulse:  (!) 57  Resp:  16  Temp:  98.8 F (37.1 C)  SpO2:  96%   No intake/output data recorded.  Intake/Output Summary (Last 24 hours) at 08/20/2023 1037 Last data filed at 08/20/2023 0430 Gross per 24 hour  Intake --  Output 1300 ml  Net -1300 ml   General: Tired appearing, no distress HEENT: anicteric sclera, oropharynx clear without lesions CV: Normal rate, no rub, nonpitting edema in the bilateral lower extremities Lungs: clear to auscultation bilaterally, normal work of breathing Abd: soft, tenderness to palpation, non-distended Skin: no visible lesions or rashes Psych: alert, engaged, appropriate mood and affect Musculoskeletal: no obvious deformities Neuro: normal speech, no gross focal deficits   Test Results Reviewed Lab Results  Component Value Date   NA 138 08/20/2023   K 4.6 08/20/2023   CL 110 08/20/2023   CO2 22 08/20/2023   BUN 37 (H) 08/20/2023   CREATININE 3.53 (H) 08/20/2023   CALCIUM 8.0 (L) 08/20/2023   ALBUMIN 3.1 (L) 08/20/2023   PHOS 3.9 03/11/2022    CBC Recent Labs  Lab 08/19/23 0143 08/19/23 1623 08/20/23 0623  WBC 6.5 6.5 5.3  NEUTROABS 4.0  --   --   HGB 10.2* 10.7* 10.3*  HCT 32.2* 34.6* 34.0*  MCV 93.1 96.9 98.6  PLT 227 248 228    I have reviewed all relevant outside healthcare records related to the patient's current hospitalization

## 2023-08-20 NOTE — Consult Note (Addendum)
Reason for Consult: Abdominal pain and elevated liver tests Referring Physician: Hospital team  Dawn Thomas is an 48 y.o. female.  HPI: Patient seen and examined and her hospital computer chart reviewed and in the last week or 2 she has had midepigastric pain radiating to her right upper quadrant into her back which reminds her of when she had her gallbladder out roughly 15 years ago and her mother had her gallbladder out but no other GI problems run in the family but she has had a colonoscopy and endoscopy a year or 2 ago in [facility] does have a hiatal hernia and takes Protonix but this feels differently and she cannot say what brings on the pain and she has never been told she had a liver problem and liver problems do not run in the family and she has no other complaints and she had her procedures EGD and colon which were reviewed in 22 and she has also had a gastric sleeve in the past  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   Renal disorder    stage 3- Dr [facility] - Dr Lequita Halt   Renal insufficiency    Sleep apnea    mild    Tendonitis    Thyroid disease     Past Surgical History:  Procedure Laterality Date   CESAREAN SECTION     CHOLECYSTECTOMY     DILATION AND CURETTAGE OF UTERUS     LAPAROSCOPIC GASTRIC SLEEVE RESECTION N/A 05/30/2017   Procedure: LAPAROSCOPIC GASTRIC SLEEVE RESECTION WITH UPPPER ENDO AND HIATAL HERNIA REPAIR;  Surgeon: Sheliah Hatch, De Blanch, MD;  Location: [facility];  Service: General;  Laterality: N/A;   PANNICULECTOMY N/A 03/07/2022   Procedure: PANNICULECTOMY WITH LIPOSUCTION;  Surgeon: Peggye Form, DO;  Location: [facility] OR;  Service: Plastics;  Laterality: N/A;  4 hours    Family History  Problem Relation Age of Onset    Diabetes Mother    Hypertension Mother    Hypertension Father    Diabetes Father    Sudden death Father    Heart attack Neg Hx    Hyperlipidemia Neg Hx     Social History:  reports that she has never smoked. She has never used smokeless tobacco. She reports that she does not drink alcohol and does not use drugs.  Allergies:  Allergies  Allergen Reactions   Maxalt [Rizatriptan Benzoate] Shortness Of Breath   Wound Dressing Adhesive Dermatitis    Other Reaction(s): Other (See Comments)  Burns skin   Zithromax [Azithromycin] Other (See Comments)    "messes with my breathing"   Adhesive [Tape] Other (See Comments)    Burns skin   Hydrocodone Hives   Nsaids Other (See Comments)    Renal insufficiency.  Other Reaction(s): Other (See Comments)  Cannot take with Renal Insufficiency., Other reaction(s): Other (See Comments), Renal insufficiency., Renal insufficiency.   Omeprazole Hives   Penicillins Hives    Medications: I have reviewed the patient's current medications.  Results for orders placed or performed during the hospital encounter of 08/18/23 (from the past 48 hours)  Lipase, blood     Status: None   Collection Time: 08/18/23  7:24 PM  Result Value  Ref Range   Lipase 31 11 - 51 U/L    Comment: Performed at [facility], [address] [place], [place] [address]  Comprehensive metabolic panel     Status: Abnormal   Collection Time: 08/18/23  7:24 PM  Result Value Ref Range   Sodium 139 135 - 145 mmol/L   Potassium 4.3 3.5 - 5.1 mmol/L   Chloride 109 98 - 111 mmol/L   CO2 21 (L) 22 - 32 mmol/L   Glucose, Bld 97 70 - 99 mg/dL    Comment: Glucose reference range applies only to samples taken after fasting for at least 8 hours.   BUN 34 (H) 6 - 20 mg/dL   Creatinine, Ser 5.05 (H) 0.44 - 1.00 mg/dL   Calcium 7.9 (L) 8.9 - 10.3 mg/dL   Total Protein 6.8 6.5 - 8.1 g/dL   Albumin 3.3 (L) 3.5 - 5.0 g/dL   AST 397 (H) 15 - 41 U/L    Comment: RESULTS CONFIRMED  BY MANUAL DILUTION   ALT 476 (H) 0 - 44 U/L    Comment: RESULTS CONFIRMED BY MANUAL DILUTION   Alkaline Phosphatase 214 (H) 38 - 126 U/L   Total Bilirubin 1.1 0.0 - 1.2 mg/dL   GFR, Estimated 18 (L) >60 mL/min    Comment: (NOTE) Calculated using the CKD-EPI Creatinine Equation (2021)    Anion gap 9 5 - 15    Comment: Performed at [facility], [address] [place], [place] [address]  CBC with Differential/Platelet     Status: Abnormal   Collection Time: 08/19/23  1:43 AM  Result Value Ref Range   WBC 6.5 4.0 - 10.5 K/uL   RBC 3.46 (L) 3.87 - 5.11 MIL/uL   Hemoglobin 10.2 (L) 12.0 - 15.0 g/dL   HCT 93.7 (L) 90.2 - 40.9 %   MCV 93.1 80.0 - 100.0 fL   MCH 29.5 26.0 - 34.0 pg   MCHC 31.7 30.0 - 36.0 g/dL   RDW 73.5 32.9 - 92.4 %   Platelets 227 150 - 400 K/uL   nRBC 0.0 0.0 - 0.2 %   Neutrophils Relative % 63 %   Neutro Abs 4.0 1.7 - 7.7 K/uL   Lymphocytes Relative 29 %   Lymphs Abs 1.9 0.7 - 4.0 K/uL   Monocytes Relative 6 %   Monocytes Absolute 0.4 0.1 - 1.0 K/uL   Eosinophils Relative 2 %   Eosinophils Absolute 0.1 0.0 - 0.5 K/uL   Basophils Relative 0 %   Basophils Absolute 0.0 0.0 - 0.1 K/uL   Immature Granulocytes 0 %   Abs Immature Granulocytes 0.02 0.00 - 0.07 K/uL    Comment: Performed at [facility], [address] [place], [place] [address]  Troponin I (High Sensitivity)     Status: Abnormal   Collection Time: 08/19/23  4:57 AM  Result Value Ref Range   Troponin I (High Sensitivity) 18 (H) <18 ng/L    Comment: (NOTE) Elevated high sensitivity troponin I (hsTnI) values and significant  changes across serial measurements may suggest ACS but many other  chronic and acute conditions are known to elevate hsTnI results.  Refer to the "Links" section for chest pain algorithms and additional  guidance. Performed at [facility], [address], [place], [place] [address]   CBC     Status: Abnormal   Collection Time: 08/19/23   4:23 PM  Result Value Ref Range  WBC 6.5 4.0 - 10.5 K/uL   RBC 3.57 (L) 3.87 - 5.11 MIL/uL   Hemoglobin 10.7 (L) 12.0 - 15.0 g/dL   HCT 56.2 (L) 13.0 - 86.5 %   MCV 96.9 80.0 - 100.0 fL   MCH 30.0 26.0 - 34.0 pg   MCHC 30.9 30.0 - 36.0 g/dL   RDW 78.4 69.6 - 29.5 %   Platelets 248 150 - 400 K/uL   nRBC 0.0 0.0 - 0.2 %    Comment: Performed at [facility], [address], [place], [place] [address]  Creatinine, serum     Status: Abnormal   Collection Time: 08/19/23  4:23 PM  Result Value Ref Range   Creatinine, Ser 3.21 (H) 0.44 - 1.00 mg/dL   GFR, Estimated 17 (L) >60 mL/min    Comment: (NOTE) Calculated using the CKD-EPI Creatinine Equation (2021) Performed at [facility], [address], [place], [place] [address]   HIV Antibody (routine testing w rflx)     Status: None   Collection Time: 08/19/23  4:23 PM  Result Value Ref Range   HIV Screen 4th Generation wRfx Non Reactive Non Reactive    Comment: Performed at [facility], [address] [place], [place] [address]  Hepatitis panel, acute     Status: None   Collection Time: 08/19/23  7:23 PM  Result Value Ref Range   Hepatitis B Surface Ag NON REACTIVE NON REACTIVE   HCV Ab NON REACTIVE NON REACTIVE    Comment: (NOTE) Nonreactive HCV antibody screen is consistent with no HCV infections,  unless recent infection is suspected or other evidence exists to indicate HCV infection.     Hep A IgM NON REACTIVE NON REACTIVE   Hep B C IgM NON REACTIVE NON REACTIVE    Comment: Performed at [facility], [address] [place], [place] [address]  APTT     Status: None   Collection Time: 08/20/23  6:23 AM  Result Value Ref Range   aPTT 29 24 - 36 seconds    Comment: Performed at [facility], [address], [place], [place] [address]  Protime-INR     Status: None   Collection Time: 08/20/23  6:23 AM  Result Value Ref Range   Prothrombin Time 13.9 11.4 -  15.2 seconds   INR 1.1 0.8 - 1.2    Comment: (NOTE) INR goal varies based on device and disease states. Performed at [facility], [address], [place], [place] [address]   Comprehensive metabolic panel     Status: Abnormal   Collection Time: 08/20/23  6:23 AM  Result Value Ref Range   Sodium 138 135 - 145 mmol/L   Potassium 4.6 3.5 - 5.1 mmol/L   Chloride 110 98 - 111 mmol/L   CO2 22 22 - 32 mmol/L   Glucose, Bld 95 70 - 99 mg/dL    Comment: Glucose reference range applies only to samples taken after fasting for at least 8 hours.   BUN 37 (H) 6 - 20 mg/dL   Creatinine, Ser 3.87 (H) 0.44 - 1.00 mg/dL   Calcium 8.0 (L) 8.9 - 10.3 mg/dL   Total Protein 6.4 (L) 6.5 - 8.1 g/dL   Albumin 3.1 (L) 3.5 - 5.0 g/dL   AST 564 (H) 15 - 41 U/L   ALT 380 (H) 0 - 44 U/L   Alkaline Phosphatase 238 (H) 38 - 126 U/L   Total Bilirubin  0.7 0.0 - 1.2 mg/dL   GFR, Estimated 15 (L) >60 mL/min    Comment: (NOTE) Calculated using the CKD-EPI Creatinine Equation (2021)    Anion gap 6 5 - 15    Comment: Performed at [facility], [address], [place], [place] [address]  CBC     Status: Abnormal   Collection Time: 08/20/23  6:23 AM  Result Value Ref Range   WBC 5.3 4.0 - 10.5 K/uL   RBC 3.45 (L) 3.87 - 5.11 MIL/uL   Hemoglobin 10.3 (L) 12.0 - 15.0 g/dL   HCT 95.2 (L) 84.1 - 32.4 %   MCV 98.6 80.0 - 100.0 fL   MCH 29.9 26.0 - 34.0 pg   MCHC 30.3 30.0 - 36.0 g/dL   RDW 40.1 02.7 - 25.3 %   Platelets 228 150 - 400 K/uL   nRBC 0.0 0.0 - 0.2 %    Comment: Performed at [facility], [address], [place], [place] [address]    US ABDOMEN LIMITED WITH LIVER DOPPLER Result Date: 08/20/2023 CLINICAL DATA:  Portal vein thrombosis suspected EXAM: DUPLEX ULTRASOUND OF LIVER TECHNIQUE: Color and duplex Doppler ultrasound was performed to evaluate the hepatic in-flow and out-flow vessels. COMPARISON:  None Available. FINDINGS: Liver: Normal  parenchymal echogenicity. Normal hepatic contour without nodularity. No focal lesion, mass or intrahepatic biliary ductal dilatation. The gallbladder is surgically absent. Main Portal Vein size: 1.3 cm Portal Vein Velocities Main Prox:  27 cm/sec Main Mid: 45 cm/sec Main Dist:  29 cm/sec Right: 27 cm/sec Left: 63 cm/sec Hepatic Vein Velocities Right:  33 cm/sec Middle:  26 cm/sec Left:  18 cm/sec IVC: Present and patent with normal respiratory phasicity. Hepatic Artery Velocity:  71 cm/sec Splenic Vein Velocity:  16 cm/sec Spleen: 10.1 cm x 5.6 cm x 9.3 cm with a total volume of 274 cm^3 (411 cm^3 is upper limit normal) Portal Vein Occlusion/Thrombus: No Splenic Vein Occlusion/Thrombus: No Ascites: None Varices: None Other: Increased echogenicity of the right kidney noted incidentally. IMPRESSION: 1. No evidence of portal venous thrombosis. 2. Normal portal and hepatic vein velocities and flow direction. 3. Significantly increased echogenicity of the renal parenchyma suggests underlying medical renal disease. 4. The gallbladder is surgically absent. Electronically Signed   By: Malachy Moan M.D.   On: 08/20/2023 05:47   US PELVIC COMPLETE WITH TRANSVAGINAL Result Date: 08/19/2023 CLINICAL DATA:  Ovarian cyst EXAM: TRANSABDOMINAL AND TRANSVAGINAL ULTRASOUND OF PELVIS TECHNIQUE: Both transabdominal and transvaginal ultrasound examinations of the pelvis were performed. Transabdominal technique was performed for global imaging of the pelvis including uterus, ovaries, adnexal regions, and pelvic cul-de-sac. It was necessary to proceed with endovaginal exam following the transabdominal exam to visualize the adnexal structures. COMPARISON:  08/17/2023 FINDINGS: Uterus Measurements: 9.5 x 5.2 x 5.7 cm = volume: 146.9 mL. No fibroids or other mass visualized. Endometrium Thickness: 6 mm.  No focal abnormality visualized. Right ovary Measurements: 3.3 x 2.5 x 2.6 cm = volume: 11.2 mL. There is a 2.2 x 0.9 x 0.7 cm  dominant follicle within the right ovary. No other adnexal masses. Left ovary Measurements: 4.6 x 3.5 x 4.8 cm = volume: 41.0 mL. There are 2 contiguous simple cysts within the left ovary, measuring 3.4 x 3.4 x 3.6 cm and 2.1 x 2.5 x 2.2 cm. No other adnexal masses. Other findings No abnormal free fluid. Endovaginal examination is limited due to patient body habitus. IMPRESSION: 1. Multiple ovarian cysts. Most significant: Left ovarian benign functional cyst  measuring 3.6 cm, if patient is premenopausal (probable benign cyst if patient is postmenopausal). If patient is premenopausal, no follow-up recommended. If patient is postmenopausal, recommend follow-up pelvic ultrasound in 3-6 months. Reference: Radiology 2019 Nov;293(2):359-371 2. Otherwise unremarkable exam. Electronically Signed   By: Sharlet Salina M.D.   On: 08/19/2023 19:43   DG Chest 2 View Result Date: 08/19/2023 CLINICAL DATA:  Intermittent upper abdominal pain EXAM: CHEST - 2 VIEW COMPARISON:  08/22/2020 FINDINGS: Stable cardiomediastinal silhouette. No focal consolidation, pleural effusion, or pneumothorax. No displaced rib fractures. IMPRESSION: No active cardiopulmonary disease. Electronically Signed   By: Minerva Fester M.D.   On: 08/19/2023 18:44    ROS negative except above she does have ovarian cysts Blood pressure (!) 128/58, pulse (!) 57, temperature 98.8 F (37.1 C), resp. rate 16, height 5\' 6"  (1.676 m), weight 120.2 kg, last menstrual period 08/02/2023, SpO2 96%, unknown if currently breastfeeding. Physical Exam vital signs stable afebrile she does seem to be a little tender in the midepigastric area and right upper quadrant without guarding or rebound ultrasound and CT reviewed labs reviewed chronic renal insufficiency transaminases in the 100s range with alk phos slight elevation in normal bili CBC normal except chronic anemia  Assessment/Plan: Upper abdominal pain with elevated transaminases questionable etiology Plan:  Agree with H&P that I would get an MRCP next and certainly if positive will need an ERCP and if negative at some point she will need other lab test for chronic liver disease like anti-smooth muscle antibody and autoimmune antibody  ceruloplasmin for 1 antitrypsin ferritin and will have rounding team check on tomorrow  [facility] 08/20/2023, 12:18 PM

## 2023-08-21 DIAGNOSIS — N184 Chronic kidney disease, stage 4 (severe): Secondary | ICD-10-CM | POA: Diagnosis present

## 2023-08-21 DIAGNOSIS — K8050 Calculus of bile duct without cholangitis or cholecystitis without obstruction: Principal | ICD-10-CM

## 2023-08-21 DIAGNOSIS — Z79890 Hormone replacement therapy (postmenopausal): Secondary | ICD-10-CM | POA: Diagnosis not present

## 2023-08-21 DIAGNOSIS — Z9884 Bariatric surgery status: Secondary | ICD-10-CM | POA: Diagnosis not present

## 2023-08-21 DIAGNOSIS — Z6841 Body Mass Index (BMI) 40.0 and over, adult: Secondary | ICD-10-CM | POA: Diagnosis not present

## 2023-08-21 DIAGNOSIS — Z79899 Other long term (current) drug therapy: Secondary | ICD-10-CM | POA: Diagnosis not present

## 2023-08-21 DIAGNOSIS — N179 Acute kidney failure, unspecified: Secondary | ICD-10-CM | POA: Diagnosis not present

## 2023-08-21 DIAGNOSIS — K759 Inflammatory liver disease, unspecified: Secondary | ICD-10-CM | POA: Diagnosis present

## 2023-08-21 DIAGNOSIS — Z885 Allergy status to narcotic agent status: Secondary | ICD-10-CM | POA: Diagnosis not present

## 2023-08-21 DIAGNOSIS — D631 Anemia in chronic kidney disease: Secondary | ICD-10-CM | POA: Diagnosis present

## 2023-08-21 DIAGNOSIS — E785 Hyperlipidemia, unspecified: Secondary | ICD-10-CM | POA: Diagnosis present

## 2023-08-21 DIAGNOSIS — Y848 Other medical procedures as the cause of abnormal reaction of the patient, or of later complication, without mention of misadventure at the time of the procedure: Secondary | ICD-10-CM | POA: Diagnosis present

## 2023-08-21 DIAGNOSIS — I129 Hypertensive chronic kidney disease with stage 1 through stage 4 chronic kidney disease, or unspecified chronic kidney disease: Secondary | ICD-10-CM | POA: Diagnosis present

## 2023-08-21 DIAGNOSIS — Z886 Allergy status to analgesic agent status: Secondary | ICD-10-CM | POA: Diagnosis not present

## 2023-08-21 DIAGNOSIS — Z833 Family history of diabetes mellitus: Secondary | ICD-10-CM | POA: Diagnosis not present

## 2023-08-21 DIAGNOSIS — Z8249 Family history of ischemic heart disease and other diseases of the circulatory system: Secondary | ICD-10-CM | POA: Diagnosis not present

## 2023-08-21 DIAGNOSIS — Z880 Allergy status to penicillin: Secondary | ICD-10-CM | POA: Diagnosis not present

## 2023-08-21 DIAGNOSIS — K219 Gastro-esophageal reflux disease without esophagitis: Secondary | ICD-10-CM | POA: Diagnosis present

## 2023-08-21 DIAGNOSIS — I160 Hypertensive urgency: Secondary | ICD-10-CM | POA: Diagnosis present

## 2023-08-21 DIAGNOSIS — K858 Other acute pancreatitis without necrosis or infection: Secondary | ICD-10-CM | POA: Diagnosis not present

## 2023-08-21 DIAGNOSIS — R7401 Elevation of levels of liver transaminase levels: Secondary | ICD-10-CM | POA: Diagnosis present

## 2023-08-21 DIAGNOSIS — N83209 Unspecified ovarian cyst, unspecified side: Secondary | ICD-10-CM | POA: Diagnosis present

## 2023-08-21 DIAGNOSIS — K9189 Other postprocedural complications and disorders of digestive system: Secondary | ICD-10-CM | POA: Diagnosis not present

## 2023-08-21 DIAGNOSIS — Z881 Allergy status to other antibiotic agents status: Secondary | ICD-10-CM | POA: Diagnosis not present

## 2023-08-21 DIAGNOSIS — E039 Hypothyroidism, unspecified: Secondary | ICD-10-CM | POA: Diagnosis present

## 2023-08-21 LAB — HEPATIC FUNCTION PANEL
ALT: 377 U/L — ABNORMAL HIGH (ref 0–44)
AST: 345 U/L — ABNORMAL HIGH (ref 15–41)
Albumin: 3.3 g/dL — ABNORMAL LOW (ref 3.5–5.0)
Alkaline Phosphatase: 266 U/L — ABNORMAL HIGH (ref 38–126)
Bilirubin, Direct: 0.6 mg/dL — ABNORMAL HIGH (ref 0.0–0.2)
Indirect Bilirubin: 0.6 mg/dL (ref 0.3–0.9)
Total Bilirubin: 1.2 mg/dL (ref 0.0–1.2)
Total Protein: 6.8 g/dL (ref 6.5–8.1)

## 2023-08-21 LAB — CBC WITH DIFFERENTIAL/PLATELET
Abs Immature Granulocytes: 0.02 10*3/uL (ref 0.00–0.07)
Basophils Absolute: 0 10*3/uL (ref 0.0–0.1)
Basophils Relative: 0 %
Eosinophils Absolute: 0.1 10*3/uL (ref 0.0–0.5)
Eosinophils Relative: 1 %
HCT: 35.6 % — ABNORMAL LOW (ref 36.0–46.0)
Hemoglobin: 10.9 g/dL — ABNORMAL LOW (ref 12.0–15.0)
Immature Granulocytes: 0 %
Lymphocytes Relative: 20 %
Lymphs Abs: 1.4 10*3/uL (ref 0.7–4.0)
MCH: 29.5 pg (ref 26.0–34.0)
MCHC: 30.6 g/dL (ref 30.0–36.0)
MCV: 96.2 fL (ref 80.0–100.0)
Monocytes Absolute: 0.4 10*3/uL (ref 0.1–1.0)
Monocytes Relative: 5 %
Neutro Abs: 5.1 10*3/uL (ref 1.7–7.7)
Neutrophils Relative %: 74 %
Platelets: 239 10*3/uL (ref 150–400)
RBC: 3.7 MIL/uL — ABNORMAL LOW (ref 3.87–5.11)
RDW: 14.5 % (ref 11.5–15.5)
WBC: 7.1 10*3/uL (ref 4.0–10.5)
nRBC: 0 % (ref 0.0–0.2)

## 2023-08-21 LAB — BASIC METABOLIC PANEL
Anion gap: 9 (ref 5–15)
BUN: 31 mg/dL — ABNORMAL HIGH (ref 6–20)
CO2: 20 mmol/L — ABNORMAL LOW (ref 22–32)
Calcium: 8.1 mg/dL — ABNORMAL LOW (ref 8.9–10.3)
Chloride: 110 mmol/L (ref 98–111)
Creatinine, Ser: 3.02 mg/dL — ABNORMAL HIGH (ref 0.44–1.00)
GFR, Estimated: 19 mL/min — ABNORMAL LOW (ref >60)
Glucose, Bld: 112 mg/dL — ABNORMAL HIGH (ref 70–99)
Potassium: 4.5 mmol/L (ref 3.5–5.1)
Sodium: 139 mmol/L (ref 135–145)

## 2023-08-21 MED ORDER — ONDANSETRON HCL 4 MG/2ML IJ SOLN
4.0000 mg | Freq: Once | INTRAMUSCULAR | Status: AC
  Administered 2023-08-21: 4 mg via INTRAVENOUS
  Filled 2023-08-21: qty 2

## 2023-08-21 MED ORDER — OXYCODONE HCL 5 MG PO TABS
5.0000 mg | ORAL_TABLET | ORAL | Status: AC | PRN
  Administered 2023-08-21: 10 mg via ORAL
  Filled 2023-08-21: qty 2

## 2023-08-21 MED ORDER — SODIUM CHLORIDE 0.9 % IV SOLN
2.0000 g | Freq: Once | INTRAVENOUS | Status: AC
  Administered 2023-08-22: 2 g via INTRAVENOUS
  Filled 2023-08-21: qty 20

## 2023-08-21 MED ORDER — HYDROMORPHONE HCL 1 MG/ML IJ SOLN
0.5000 mg | Freq: Once | INTRAMUSCULAR | Status: AC
Start: 2023-08-21 — End: 2023-08-21
  Administered 2023-08-21: 0.5 mg via INTRAVENOUS
  Filled 2023-08-21: qty 0.5

## 2023-08-21 MED ORDER — OXYCODONE HCL 5 MG PO TABS
5.0000 mg | ORAL_TABLET | ORAL | Status: DC | PRN
Start: 2023-08-21 — End: 2023-08-21
  Administered 2023-08-21: 5 mg via ORAL
  Filled 2023-08-21: qty 1

## 2023-08-21 MED ORDER — HYDROMORPHONE HCL 1 MG/ML IJ SOLN
0.5000 mg | INTRAMUSCULAR | Status: DC | PRN
  Administered 2023-08-21 (×5): 0.5 mg via INTRAVENOUS
  Filled 2023-08-21 (×7): qty 0.5

## 2023-08-21 MED ORDER — ONDANSETRON HCL 4 MG/2ML IJ SOLN
4.0000 mg | Freq: Four times a day (QID) | INTRAMUSCULAR | Status: DC | PRN
  Administered 2023-08-21 – 2023-08-23 (×4): 4 mg via INTRAVENOUS
  Filled 2023-08-21 (×4): qty 2

## 2023-08-21 NOTE — Progress Notes (Signed)
[facility] Progress Note  Subjective: seen in reom, has procedure tomorrow. Ate okay yesterday. Creat down 3.0 today.   Vitals:   08/20/23 1433 08/20/23 1942 08/21/23 0456 08/21/23 0830  BP: (!) 144/67 (!) 146/69 (!) 175/69 (!) 175/69  Pulse: 70 74 (!) 59   Resp: 20 16 16    Temp: 98.5 F (36.9 C) 99 F (37.2 C) 98.6 F (37 C)   TempSrc:      SpO2: 98% 99% 100%   Weight:      Height:        Exam: General: Tired appearing, no distress HEENT: anicteric sclera, oropharynx clear without lesions CV: Normal rate, no rub Lungs: clear to auscultation bilaterally, normal work of breathing Abd: soft, tenderness to palpation, non-distended Skin: no visible lesions or rashes Psych: alert, engaged, appropriate mood and affect Musculoskeletal: no obvious deformities Neuro: normal speech, no gross focal deficits   UA prot > 300, large Hb, 11-20 rbc (on her period) UNa 72 CT abd /pelvis w/o contrast - Urinary Tract: No  hydronephrosis or stone. History of renal insufficiency. Bilateral renal cortical thinning and lobulation. Unremarkable bladder.    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  Creatinine 3.5 at time of consultation.  Mild AKI expected in the setting of acute illness.  Appeared euvolemic. UA +proteinuria and mild hematuria (pt on her period). Abd CT with chronic kidney disease, no obstruction. Today creat down to 3.0.  Continue holding spironolactone AKI resolving If not eating/ drinking would give gentle IVF's ~65- 85 cc/hr Will sign off pt will f/u w/ her nephrologist Dr. Lequita Halt.   Elevated LFTs: Some form of hepatitis.  Unclear cause.  Could be viral.  Workup and management per primary team.    Hypertension: Blood pressure improved with home medications.  Holding spironolactone.  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  Followed by [facility].  Hematuria but on her menstral cycle. Consider repeat inpatient if GFR worsens and GN work up. Otherwise defer to outpatient setting   Anemia: obtain iron studies. Transfuse as needed     Vinson Moselle MD  [facility] 08/21/2023, 12:48 PM  Recent Labs  Lab 08/20/23 0623 08/21/23 0446  HGB 10.3* 10.9*  ALBUMIN 3.1* 3.3*  CALCIUM 8.0* 8.1*  CREATININE 3.53* 3.02*  K 4.6 4.5   Recent Labs  Lab 08/20/23 1208  IRON 57  TIBC 337  FERRITIN 110   Inpatient medications:  heparin injection (subcutaneous)  5,000 Units Subcutaneous Q8H   hydrALAZINE  50 mg Oral Q8H   levothyroxine  125 mcg Oral Q0600   NIFEdipine  60 mg Oral BID   pantoprazole  40 mg Oral BID   sodium chloride flush  3 mL Intravenous Q12H    [START ON 08/22/2023] cefTRIAXone (ROCEPHIN)  IV     acetaminophen, albuterol, hydrALAZINE, HYDROmorphone (DILAUDID) injection, labetalol, ondansetron (ZOFRAN) IV, polyethylene glycol

## 2023-08-21 NOTE — Anesthesia Preprocedure Evaluation (Signed)
Anesthesia Evaluation  Patient identified by MRN, date of birth, ID band Patient awake    Reviewed: Allergy & Precautions, NPO status , Patient's Chart, lab work & pertinent test results  Airway Mallampati: I  TM Distance: >3 FB Neck ROM: Full    Dental no notable dental hx. (+) Teeth Intact, Dental Advisory Given   Pulmonary asthma , sleep apnea and Continuous Positive Airway Pressure Ventilation    Pulmonary exam normal breath sounds clear to auscultation       Cardiovascular hypertension, Pt. on medications Normal cardiovascular exam Rhythm:Regular Rate:Normal  08/2020 TTE 1. Left ventricular ejection fraction, by estimation, is 50 to 55%. The  left ventricle has low normal function. The left ventricle has no regional  wall motion abnormalities. There is mild concentric left ventricular  hypertrophy. Left ventricular  diastolic parameters are indeterminate.   2. Right ventricular systolic function is normal. The right ventricular  size is normal.   3. The mitral valve is normal in structure. Trivial mitral valve  regurgitation.   4. The aortic valve is grossly normal. There is mild calcification of the  aortic valve. Aortic valve regurgitation is moderate. Mild aortic valve  sclerosis is present, with no evidence of aortic valve stenosis.   5. The inferior vena cava is normal in size with greater than 50%  respiratory variability, suggesting right atrial pressure of 3 mmHg.      Neuro/Psych    GI/Hepatic ,GERD  ,,  Endo/Other  Hypothyroidism  Class 3 obesity  Renal/GU Renal diseaseLab Results      Component                Value               Date                         K                        4.5                 08/21/2023           BUN                      31 (H)              08/21/2023               CREATININE               3.02 (H)            08/21/2023                          Musculoskeletal    Abdominal  (+) + obese  Peds  Hematology  (+) Blood dyscrasia, anemia Lab Results      Component                Value               Date                      WBC                      7.1  08/21/2023                HGB                      10.9 (L)            08/21/2023                HCT                      35.6 (L)            08/21/2023                MCV                      96.2                08/21/2023                PLT                      239                 08/21/2023              Anesthesia Other Findings All: see list  Reproductive/Obstetrics                             Anesthesia Physical Anesthesia Plan  ASA: 3  Anesthesia Plan: General   Post-op Pain Management: Minimal or no pain anticipated   Induction: Intravenous  PONV Risk Score and Plan: 4 or greater and Treatment may vary due to age or medical condition, Ondansetron and Midazolam  Airway Management Planned: Oral ETT  Additional Equipment: None  Intra-op Plan:   Post-operative Plan: Extubation in OR  Informed Consent:      Dental advisory given  Plan Discussed with:   Anesthesia Plan Comments:         Anesthesia Quick Evaluation

## 2023-08-21 NOTE — Plan of Care (Signed)
  Problem: Activity: Goal: Risk for activity intolerance will decrease Outcome: Progressing   Problem: Coping: Goal: Level of anxiety will decrease Outcome: Progressing   Problem: Safety: Goal: Ability to remain free from injury will improve Outcome: Progressing   

## 2023-08-21 NOTE — Plan of Care (Signed)

## 2023-08-21 NOTE — Progress Notes (Signed)
[facility]astroenterology Progress Note  Dawn Thomas 48 y.o. 15-Feb-1976  CC: Abdominal pain, abnormal LFTs   Subjective: Patient seen and examined at bedside.  She still having abdominal pain.  Denies nausea or vomiting.  ROS : Afebrile, negative for chest pain   Objective: Vital signs in last 24 hours: Vitals:   08/21/23 0456 08/21/23 0830  BP: (!) 175/69 (!) 175/69  Pulse: (!) 59   Resp: 16   Temp: 98.6 F (37 C)   SpO2: 100%     Physical Exam: Morbidly obese, resting comfortably, not in acute distress.  Right upper quadrant tenderness to palpation, morbidly obese abdomen, difficult examination, bowel sound present, no peritoneal signs.  Mood and affect normal.   Lab Results: Recent Labs    08/20/23 0623 08/21/23 0446  NA 138 139  K 4.6 4.5  CL 110 110  CO2 22 20*  GLUCOSE 95 112*  BUN 37* 31*  CREATININE 3.53* 3.02*  CALCIUM 8.0* 8.1*   Recent Labs    08/20/23 0623 08/21/23 0446  AST 309* 345*  ALT 380* 377*  ALKPHOS 238* 266*  BILITOT 0.7 1.2  PROT 6.4* 6.8  ALBUMIN 3.1* 3.3*   Recent Labs    08/19/23 0143 08/19/23 1623 08/20/23 0623 08/21/23 0446  WBC 6.5   < > 5.3 7.1  NEUTROABS 4.0  --   --  5.1  HGB 10.2*   < > 10.3* 10.9*  HCT 32.2*   < > 34.0* 35.6*  MCV 93.1   < > 98.6 96.2  PLT 227   < > 228 239   < > = values in this interval not displayed.   Recent Labs    08/20/23 0623  LABPROT 13.9  INR 1.1      Assessment/Plan: -Choledocholithiasis.  MRI MRCP confirmed choledocholithiasis within distal common bile duct. - Abdominal pain with abnormal LFTs.  Likely from above. -Chronic anemia.  Since 2023. -Chronic kidney disease  Recommendations ------------------------- -He is discussed with Dr. Ewing Schlein.  Plan for ERCP tomorrow.  Okay to have diet today, keep n.p.o. past midnight. -she is allergic to penicillin and has chronic kidney disease.  Will recommend Rocephin for preop.   Risks (post ERCP pancreatitis, bleeding, infection,  bowel perforation that could require surgery, sedation-related changes in cardiopulmonary systems), benefits (identification and possible treatment of source of symptoms, exclusion of certain causes of symptoms), and alternatives (watchful waiting, radiographic imaging studies, empiric medical treatment)  were explained to patient/family in detail and patient wishes to proceed.   Kathi Der MD, FACP 08/21/2023, 9:16 AM  Contact #  [phone]

## 2023-08-21 NOTE — Progress Notes (Signed)
Progress Note   Patient: Dawn Thomas JIR:[id number] DOB: 1976-06-26 DOA: 08/18/2023     0 DOS: the patient was seen and examined on 08/21/2023   Brief hospital course: 48yo with h/o morbid obesity s/p bariatric surgery, HLD, HTN, and hypothyroidism who presented on 1/18 with abdominal pain.  She returned from a cruise to the [place] and had acute onset of pain on 1/4 with episodic pain since.  CT on 1/16 (PTA) showed periportal and perihepatic edema and cystic ovarian enlargement.  CXR on admission negative.  Pelvic US with multiple ovarian cysts.  RUQ without evidence of portal venous thrombosis and with evidence of medical renal disease.  Marked LFT elevation, marked HTN.  MRCP performed and showed choledocholithiasis, will need ERCP (scheduled for 1/21).  Assessment and Plan:  Choledocholithiasis Remote cholecystectomy Patient is presenting with severe episodic pain that is colicky in nature Associated with LFT elevation, 300-400 range without significant bilirubin elevation CT finding of periportal/perihepatic edema Unremarkable RUQ [place] ultrasound  Negative acute hepatitis panel GI is consulted MRCP performed and confirmed 2 x 5 mm calculi in distal CBD as the cause of her pain Hold atorvastatin ERCP scheduled for tomorrow AM with Dr. Ewing Schlein Dilaudid and Zofran prn today for pain/nausea    Stage 4 CKD Creatinine at baseline around 3 Slightly worse than baseline on presentation, now back to baseline RUS with chronic renal disease Nephrology consulted Avoid nephrotoxic agents Hold spironolactone Repeat CMP in AM   Ovarian cyst Seen incidentally on CT Pelvic US with multiple ovarian cysts, nothing particularly concerning at this time   GERD (gastroesophageal reflux disease) Continue PPI   Essential hypertension Markedly elevated BP on presentation Likely related to missed outpatient BP medications Continue hydralazine, nifedipine Hold spironlactone    HLD Hold atorvastatin in the setting of transaminitis   Hypothyroidism Continue Synthroid   Morbid obesity Body mass index is 42.77 kg/m..  S/p bariatric surgery and panniculectomy Ongoing weight loss should be encouraged Outpatient PCP/bariatric medicine/bariatric surgery f/u encouraged          Consultants: GI Nephrology   Procedures: None   Antibiotics: None     Subjective: Severe episodic pain is ongoing, understands plan for ERCP tomorrow.   Objective: Vitals:   08/21/23 0456 08/21/23 0830  BP: (!) 175/69 (!) 175/69  Pulse: (!) 59   Resp: 16   Temp: 98.6 F (37 C)   SpO2: 100%    No intake or output data in the 24 hours ending 08/21/23 1106 Filed Weights   08/18/23 1921  Weight: 120.2 kg    Exam:  General:  Appears miserable this AM with pain and nausea Eyes:  EOMI, normal lids, iris ENT:  grossly normal hearing, lips & tongue, mmm Neck:  no LAD, masses or thyromegaly Cardiovascular:  RRR, no m/r/g. No LE edema.  Respiratory:   CTA bilaterally with no wheezes/rales/rhonchi.  Normal respiratory effort. Abdomen:  soft, extremely TTP today with peritoneal signs, limited by habitus Skin:  no rash or induration seen on limited exam Musculoskeletal:  grossly normal tone BUE/BLE, good ROM, no bony abnormality Psychiatric:  blunted mood and affect, speech fluent and appropriate, AOx3 Neurologic:  CN 2-12 grossly intact, moves all extremities in coordinated fashion  Data Reviewed: I have reviewed the patient's lab results since admission.  Pertinent labs for today include:  CO2 20 BUN 31/Creatinine 3.02/GFR 19 - improved AP 266 AST 345/ALT 377 WBC 7.1 Hgb 10.9 Urine Na++ 72     Family Communication: None  present  Disposition: Status is: Inpatient Admit - It is my clinical opinion that admission to INPATIENT is reasonable and necessary because of the expectation that this patient will require hospital care that crosses at least 2 midnights to  treat this condition based on the medical complexity of the problems presented.  Given the aforementioned information, the predictability of an adverse outcome is felt to be significant.      Time spent: 50 minutes  Unresulted Labs (From admission, onward)     Start     Ordered   08/22/23 0500  CBC with Differential/Platelet  Tomorrow morning,   R        08/21/23 1102   08/21/23 0500  Basic metabolic panel  Daily,   R      08/20/23 0821   08/21/23 0500  Hepatic function panel  Daily,   R      08/20/23 1648             Author: Jonah Blue, MD 08/21/2023 11:06 AM  For on call review [email or web address].

## 2023-08-22 ENCOUNTER — Inpatient Hospital Stay (HOSPITAL_COMMUNITY): Payer: Self-pay | Admitting: Anesthesiology

## 2023-08-22 ENCOUNTER — Encounter (HOSPITAL_COMMUNITY)
Admission: EM | Disposition: A | Discharge status: Home / Self Care | Payer: Self-pay | Source: Home / Self Care | Attending: Internal Medicine

## 2023-08-22 ENCOUNTER — Inpatient Hospital Stay (HOSPITAL_COMMUNITY): Payer: Self-pay

## 2023-08-22 ENCOUNTER — Encounter (HOSPITAL_COMMUNITY): Payer: Self-pay | Admitting: Internal Medicine

## 2023-08-22 DIAGNOSIS — N184 Chronic kidney disease, stage 4 (severe): Secondary | ICD-10-CM

## 2023-08-22 DIAGNOSIS — E039 Hypothyroidism, unspecified: Secondary | ICD-10-CM

## 2023-08-22 DIAGNOSIS — K8050 Calculus of bile duct without cholangitis or cholecystitis without obstruction: Secondary | ICD-10-CM

## 2023-08-22 DIAGNOSIS — I129 Hypertensive chronic kidney disease with stage 1 through stage 4 chronic kidney disease, or unspecified chronic kidney disease: Secondary | ICD-10-CM

## 2023-08-22 HISTORY — PX: ERCP: SHX5425

## 2023-08-22 HISTORY — PX: SPHINCTEROTOMY: SHX5544

## 2023-08-22 HISTORY — PX: PANCREATIC STENT PLACEMENT: SHX5539

## 2023-08-22 HISTORY — PX: REMOVAL OF STONES: SHX5545

## 2023-08-22 HISTORY — PX: STENT REMOVAL: SHX6421

## 2023-08-22 LAB — CBC WITH DIFFERENTIAL/PLATELET
Abs Immature Granulocytes: 0.02 10*3/uL (ref 0.00–0.07)
Basophils Absolute: 0 10*3/uL (ref 0.0–0.1)
Basophils Relative: 0 %
Eosinophils Absolute: 0.1 10*3/uL (ref 0.0–0.5)
Eosinophils Relative: 1 %
HCT: 33.9 % — ABNORMAL LOW (ref 36.0–46.0)
Hemoglobin: 10.4 g/dL — ABNORMAL LOW (ref 12.0–15.0)
Immature Granulocytes: 0 %
Lymphocytes Relative: 31 %
Lymphs Abs: 1.5 10*3/uL (ref 0.7–4.0)
MCH: 29.8 pg (ref 26.0–34.0)
MCHC: 30.7 g/dL (ref 30.0–36.0)
MCV: 97.1 fL (ref 80.0–100.0)
Monocytes Absolute: 0.3 10*3/uL (ref 0.1–1.0)
Monocytes Relative: 6 %
Neutro Abs: 3.1 10*3/uL (ref 1.7–7.7)
Neutrophils Relative %: 62 %
Platelets: 225 10*3/uL (ref 150–400)
RBC: 3.49 MIL/uL — ABNORMAL LOW (ref 3.87–5.11)
RDW: 14.6 % (ref 11.5–15.5)
WBC: 5.1 10*3/uL (ref 4.0–10.5)
nRBC: 0 % (ref 0.0–0.2)

## 2023-08-22 LAB — BASIC METABOLIC PANEL
Anion gap: 6 (ref 5–15)
BUN: 26 mg/dL — ABNORMAL HIGH (ref 6–20)
CO2: 21 mmol/L — ABNORMAL LOW (ref 22–32)
Calcium: 8.1 mg/dL — ABNORMAL LOW (ref 8.9–10.3)
Chloride: 112 mmol/L — ABNORMAL HIGH (ref 98–111)
Creatinine, Ser: 3.14 mg/dL — ABNORMAL HIGH (ref 0.44–1.00)
GFR, Estimated: 18 mL/min — ABNORMAL LOW (ref >60)
Glucose, Bld: 95 mg/dL (ref 70–99)
Potassium: 4.6 mmol/L (ref 3.5–5.1)
Sodium: 139 mmol/L (ref 135–145)

## 2023-08-22 LAB — HEPATIC FUNCTION PANEL
ALT: 664 U/L — ABNORMAL HIGH (ref 0–44)
AST: 617 U/L — ABNORMAL HIGH (ref 15–41)
Albumin: 3 g/dL — ABNORMAL LOW (ref 3.5–5.0)
Alkaline Phosphatase: 276 U/L — ABNORMAL HIGH (ref 38–126)
Bilirubin, Direct: 0.5 mg/dL — ABNORMAL HIGH (ref 0.0–0.2)
Indirect Bilirubin: 0.8 mg/dL (ref 0.3–0.9)
Total Bilirubin: 1.3 mg/dL — ABNORMAL HIGH (ref 0.0–1.2)
Total Protein: 6.1 g/dL — ABNORMAL LOW (ref 6.5–8.1)

## 2023-08-22 SURGERY — ERCP, WITH INTERVENTION IF INDICATED
Anesthesia: General

## 2023-08-22 MED ORDER — PHENYLEPHRINE HCL (PRESSORS) 10 MG/ML IV SOLN
INTRAVENOUS | Status: DC | PRN
  Administered 2023-08-22: 100 ug via INTRAVENOUS

## 2023-08-22 MED ORDER — DICLOFENAC SUPPOSITORY 100 MG
RECTAL | Status: DC | PRN
  Administered 2023-08-22: 100 mg via RECTAL

## 2023-08-22 MED ORDER — MIDAZOLAM HCL 2 MG/2ML IJ SOLN
INTRAMUSCULAR | Status: AC
Start: 2023-08-22 — End: ?
  Filled 2023-08-22: qty 2

## 2023-08-22 MED ORDER — PROPOFOL 10 MG/ML IV BOLUS
INTRAVENOUS | Status: AC
  Filled 2023-08-22: qty 20

## 2023-08-22 MED ORDER — PROPOFOL 10 MG/ML IV BOLUS
INTRAVENOUS | Status: DC | PRN
  Administered 2023-08-22: 200 mg via INTRAVENOUS

## 2023-08-22 MED ORDER — DEXMEDETOMIDINE HCL IN NACL 80 MCG/20ML IV SOLN
INTRAVENOUS | Status: DC | PRN
  Administered 2023-08-22: 8 ug via INTRAVENOUS

## 2023-08-22 MED ORDER — GLYCOPYRROLATE 0.2 MG/ML IJ SOLN
INTRAMUSCULAR | Status: DC | PRN
  Administered 2023-08-22: .2 mg via INTRAVENOUS

## 2023-08-22 MED ORDER — SODIUM CHLORIDE 0.9 % IV SOLN
INTRAVENOUS | Status: DC | PRN
  Administered 2023-08-22: 40 mL

## 2023-08-22 MED ORDER — ONDANSETRON HCL 4 MG/2ML IJ SOLN
INTRAMUSCULAR | Status: DC | PRN
  Administered 2023-08-22: 4 mg via INTRAVENOUS

## 2023-08-22 MED ORDER — ROCURONIUM BROMIDE 100 MG/10ML IV SOLN
INTRAVENOUS | Status: DC | PRN
  Administered 2023-08-22: 30 mg via INTRAVENOUS
  Administered 2023-08-22: 20 mg via INTRAVENOUS

## 2023-08-22 MED ORDER — DICLOFENAC SUPPOSITORY 100 MG
RECTAL | Status: AC
  Filled 2023-08-22: qty 1

## 2023-08-22 MED ORDER — SUCCINYLCHOLINE CHLORIDE 200 MG/10ML IV SOSY
PREFILLED_SYRINGE | INTRAVENOUS | Status: DC | PRN
  Administered 2023-08-22: 120 mg via INTRAVENOUS

## 2023-08-22 MED ORDER — SUGAMMADEX SODIUM 200 MG/2ML IV SOLN
INTRAVENOUS | Status: DC | PRN
  Administered 2023-08-22: 400 mg via INTRAVENOUS

## 2023-08-22 MED ORDER — SIMETHICONE 80 MG PO CHEW
80.0000 mg | CHEWABLE_TABLET | Freq: Four times a day (QID) | ORAL | Status: DC | PRN
  Administered 2023-08-22 – 2023-08-23 (×4): 80 mg via ORAL
  Filled 2023-08-22 (×4): qty 1

## 2023-08-22 MED ORDER — FENTANYL CITRATE (PF) 100 MCG/2ML IJ SOLN
INTRAMUSCULAR | Status: AC
  Filled 2023-08-22: qty 2

## 2023-08-22 MED ORDER — MIDAZOLAM HCL 5 MG/5ML IJ SOLN
INTRAMUSCULAR | Status: DC | PRN
  Administered 2023-08-22 (×2): 1 mg via INTRAVENOUS

## 2023-08-22 MED ORDER — LIDOCAINE HCL (CARDIAC) PF 100 MG/5ML IV SOSY
PREFILLED_SYRINGE | INTRAVENOUS | Status: DC | PRN
  Administered 2023-08-22: 40 mg via INTRAVENOUS
  Administered 2023-08-22: 60 mg via INTRAVENOUS

## 2023-08-22 MED ORDER — DEXAMETHASONE SODIUM PHOSPHATE 10 MG/ML IJ SOLN
INTRAMUSCULAR | Status: DC | PRN
  Administered 2023-08-22: 8 mg via INTRAVENOUS

## 2023-08-22 MED ORDER — HYDRALAZINE HCL 20 MG/ML IJ SOLN
INTRAMUSCULAR | Status: AC
  Filled 2023-08-22: qty 1

## 2023-08-22 MED ORDER — GLUCAGON HCL RDNA (DIAGNOSTIC) 1 MG IJ SOLR
INTRAMUSCULAR | Status: AC
  Filled 2023-08-22: qty 1

## 2023-08-22 MED ORDER — FENTANYL CITRATE (PF) 100 MCG/2ML IJ SOLN
INTRAMUSCULAR | Status: DC | PRN
  Administered 2023-08-22: 100 ug via INTRAVENOUS

## 2023-08-22 MED ORDER — TRAMADOL HCL 50 MG PO TABS
25.0000 mg | ORAL_TABLET | Freq: Once | ORAL | Status: AC
  Administered 2023-08-22: 25 mg via ORAL
  Filled 2023-08-22: qty 1

## 2023-08-22 MED ORDER — SODIUM CHLORIDE 0.9 % IV SOLN: INTRAVENOUS | Status: DC | PRN

## 2023-08-22 NOTE — Transfer of Care (Signed)
Immediate Anesthesia Transfer of Care Note  Patient: Dawn Thomas  Procedure(s) Performed: ENDOSCOPIC RETROGRADE CHOLANGIOPANCREATOGRAPHY (ERCP) SPHINCTEROTOMY REMOVAL OF STONES PANCREATIC STENT PLACEMENT STENT REMOVAL  Patient Location: PACU and Endoscopy Unit  Anesthesia Type:General  Level of Consciousness: awake, alert , oriented, and patient cooperative  Airway & Oxygen Therapy: Patient Spontanous Breathing and Patient connected to nasal cannula oxygen  Post-op Assessment: Report given to RN and Post -op Vital signs reviewed and stable  Post vital signs: Reviewed and stable  Last Vitals:  Vitals Value Taken Time  BP 209/91 08/22/23 0906  Temp    Pulse 73 08/22/23 0908  Resp 16 08/22/23 0908  SpO2 96 % 08/22/23 0908  Vitals shown include unfiled device data.  Last Pain:  Vitals:   08/22/23 0644  TempSrc: Temporal  PainSc: 0-No pain      Patients Stated Pain Goal: 0 (08/21/23 0217)  Complications: No notable events documented.

## 2023-08-22 NOTE — Plan of Care (Signed)
  Problem: Education: Goal: Knowledge of General Education information will improve Description: Including pain rating scale, medication(s)/side effects and non-pharmacologic comfort measures Outcome: Progressing   Problem: Clinical Measurements: Goal: Ability to maintain clinical measurements within normal limits will improve Outcome: Progressing   Problem: Clinical Measurements: Goal: Will remain free from infection Outcome: Progressing   Problem: Pain Managment: Goal: General experience of comfort will improve and/or be controlled Outcome: Progressing   Problem: Safety: Goal: Ability to remain free from injury will improve Outcome: Progressing

## 2023-08-22 NOTE — Anesthesia Postprocedure Evaluation (Signed)
Anesthesia Post Note  Patient: Airline pilot  Procedure(s) Performed: ENDOSCOPIC RETROGRADE CHOLANGIOPANCREATOGRAPHY (ERCP) SPHINCTEROTOMY REMOVAL OF STONES PANCREATIC STENT PLACEMENT STENT REMOVAL     Patient location during evaluation: PACU Anesthesia Type: General Level of consciousness: awake and alert Pain management: pain level controlled Vital Signs Assessment: post-procedure vital signs reviewed and stable Respiratory status: spontaneous breathing, nonlabored ventilation, respiratory function stable and patient connected to nasal cannula oxygen Cardiovascular status: blood pressure returned to baseline and stable Postop Assessment: no apparent nausea or vomiting Anesthetic complications: no   No notable events documented.  Last Vitals:  Vitals:   08/22/23 0947 08/22/23 1103  BP: (!) 159/68 (!) 151/65  Pulse: (!) 58 (!) 55  Resp: 16 16  Temp: 36.7 C 37.4 C  SpO2: 93% 95%    Last Pain:  Vitals:   08/22/23 1103  TempSrc:   PainSc: 0-No pain                 Trevor Iha

## 2023-08-22 NOTE — Op Note (Signed)
[facility] Patient Name: Dawn Thomas Procedure Date: 08/22/2023 MRN: [id number] Attending MD: Vida Rigger , MD, [id number] Date of Birth: 08-Oct-1975 CSN: [id number] Age: 48 Admit Type: Outpatient Procedure:                ERCP Indications:              Bile duct stone(s) Providers:                Vida Rigger, MD, Norman Clay, RN, Geoffery Lyons,                            Technician Referring MD:              Medicines:                General Anesthesia Complications:            No immediate complications. Estimated Blood Loss:     Estimated blood loss: none. Estimated blood loss:                            none. Procedure:                Pre-Anesthesia Assessment:                           - Prior to the procedure, a History and Physical                            was performed, and patient medications and                            allergies were reviewed. The patient's tolerance of                            previous anesthesia was also reviewed. The risks                            and benefits of the procedure and the sedation                            options and risks were discussed with the patient.                            All questions were answered, and informed consent                            was obtained. Prior Anticoagulants: The patient has                            taken no anticoagulant or antiplatelet agents. ASA                            Grade Assessment: II - A patient with mild systemic                            disease. After reviewing the  risks and benefits,                            the patient was deemed in satisfactory condition to                            undergo the procedure.                           After obtaining informed consent, the scope was                            passed under direct vision. Throughout the                            procedure, the patient's blood pressure, pulse, and                            oxygen  saturations were monitored continuously. The                            [id number]-[id number] ([id number] Olympus duodenoscope was                            introduced through the mouth, and used to inject                            contrast into and used to locate the major papilla.                            The ERCP was technically difficult and complex due                            to challenging cannulation. Successful completion                            of the procedure was aided by performing the                            maneuvers documented (below) in this report. The                            patient tolerated the procedure well. Scope In: Scope Out: Findings:      The major papilla was normal. Unfortunately the wire went towards the       pancreas twice so we placed one 4 Fr by 3 cm temporary plastic       pancreatic stent with a 3/4 external pigtail and no internal flaps was       placed 2 cm into the ventral pancreatic duct. The stent was in good       position. Unfortunately we could not cannulate with the regular       sphincterotome loaded with the regular wire and the wire did go towards       the pancreas a few times so we switched to the smaller  sphincterotome       and wire and again the wire went towards the pancreas so we proceeded       with a biliary small pre-cut sphincterotomy was made with a Hydratome       sphincterotome using ERBE electrocautery. There was no       post-sphincterotomy bleeding. We were then able to get deep selective       cannulation and we completed the sphincterotomy and the biliary       sphincterotomy was made with a Hydratome sphincterotome using ERBE       electrocautery. There was no post-sphincterotomy bleeding. There is       adequate biliary drainage and we can get the fully bowed sphincterotome       easily in and out of the duct and to discover objects, the biliary tree       was swept with an adjustable 12- 15 mm balloon starting at the        bifurcation. All stones were removed. Nothing was found on multiple       subsequent balloon pull-through. We did use both sides balloons starting       at the bifurcation multiple times however needed to lower the 15 mm       balloon to 12 which passed readily through the pain sphincterotomy site       and we proceeded with an occlusion cholangiogram which did not show any       residual abnormalities and there was adequate biliary drainage and       unfortunately the PD stent fell out of the duct and this stent was       removed from Duodenum using a regular forceps. The stent and biopsy       forceps were removed the scope was removed the patient tolerated the       procedure well there was no obvious immediate complication Impression:               - The major papilla appeared normal. Moderate Sedation:      Not Applicable - Patient had care per Anesthesia. Recommendation:           - Clear liquid diet for 6 hours. If doing well at 4                            PM may have soft solids                           - Continue present medications.                           - Return to GI clinic PRN.                           - Telephone GI clinic if symptomatic PRN.                           - Check liver enzymes (AST, ALT, alkaline                            phosphatase, bilirubin) in the morning. And follow  back to normal as an outpatient Procedure Code(s):        --- Professional ---                           [phone], Esophagogastroduodenoscopy, flexible,                            transoral; with removal of foreign body(s) Diagnosis Code(s):        --- Professional ---                           K80.50, Calculus of bile duct without cholangitis                            or cholecystitis without obstruction CPT copyright 2022 American [employer] Association. All rights reserved. The codes documented in this report are preliminary and upon coder review may  be  revised to meet current compliance requirements. Vida Rigger, MD 08/22/2023 9:03:52 AM This report has been signed electronically. Number of Addenda: 0

## 2023-08-22 NOTE — Progress Notes (Addendum)
Dawn Thomas 7:32 AM  Subjective: Patient doing better today than yesterday and we rediscussed the procedure and answered all of her questions  Objective: Vital signs stable afebrile no acute distress exam please see preassessment evaluation LFTs slight increase CBC okay MRI reviewed  Assessment: CBD stones  Plan: Risk benefits methods and success rate of ERCP was discussed with the patient and we will proceed today with anesthesia assistance  [facility]  office [phone] After 5PM or if no answer call [phone]

## 2023-08-22 NOTE — Progress Notes (Signed)
 Patient off the floor for ERCP.

## 2023-08-22 NOTE — Progress Notes (Signed)
Progress Note   Patient: Dawn Thomas ZOX:[id number] DOB: [birth year] DOA: 08/18/2023     1 DOS: the patient was seen and examined on 08/22/2023   Brief hospital course:  Choledocholithiasis Remote cholecystectomy Patient is presenting with severe episodic pain that is colicky in nature Associated with LFT elevation, 300-400 range without significant bilirubin elevation CT finding of periportal/perihepatic edema Unremarkable RUQ [place] ultrasound  Negative acute hepatitis panel GI is consulted MRCP performed and confirmed 2 x 5 mm calculi in distal CBD as the cause of her pain Hold atorvastatin Successful ERCP performed on 1/21 with Dr. Ewing Schlein Discontinue Dilaudid, will add simethicone for gas pain Patient prefers to stay overnight for additional monitoring and dc on 1/22    Stage 4 CKD Creatinine at baseline around 3 Slightly worse than baseline on presentation, now back to baseline RUS with chronic renal disease Nephrology consulted Avoid nephrotoxic agents Hold spironolactone   Ovarian cyst Seen incidentally on CT Pelvic US with multiple ovarian cysts, nothing particularly concerning at this time   GERD (gastroesophageal reflux disease) Continue PPI   Essential hypertension Markedly elevated BP on presentation Likely related to missed outpatient BP medications Continue hydralazine, nifedipine Hold spironlactone   HLD Hold atorvastatin in the setting of transaminitis   Hypothyroidism Continue Synthroid   Morbid obesity Body mass index is 42.77 kg/m..  S/p bariatric surgery and panniculectomy Ongoing weight loss should be encouraged Outpatient PCP/bariatric medicine/bariatric surgery f/u encouraged          Consultants: GI Nephrology   Procedures: ERCP 1/21   Antibiotics: Ceftriaxone x 1    30 Day Unplanned Readmission Risk Score    Flowsheet Row ED to Hosp-Admission (Current) from 08/18/2023 in [facility]  30 Day  Unplanned Readmission Risk Score (%) 12.71 Filed at 08/22/2023 0801       This score is the patient's risk of an unplanned readmission within 30 days of being discharged (0 -100%). The score is based on dignosis, age, lab data, medications, orders, and past utilization.   Low:  0-14.9   Medium: 15-21.9   High: 22-29.9   Extreme: 30 and above           Subjective: Somnolent post-procedure but better.   Objective: Vitals:   08/22/23 1340 08/22/23 1416  BP: (!) 157/77 (!) 142/66  Pulse: 79   Resp: 18   Temp: 99.2 F (37.3 C)   SpO2: 94%     Intake/Output Summary (Last 24 hours) at 08/22/2023 1546 Last data filed at 08/22/2023 0908 Gross per 24 hour  Intake 553 ml  Output 2 ml  Net 551 ml   Filed Weights   08/18/23 1921  Weight: 120.2 kg    Exam:  General:  Calm, somnolent Eyes:  EOMI, normal lids, iris ENT:  grossly normal hearing, lips & tongue, mmm Neck:  no LAD, masses or thyromegaly Cardiovascular:  RRR, no m/r/g. No LE edema.  Respiratory:   CTA bilaterally with no wheezes/rales/rhonchi.  Normal respiratory effort. Abdomen:  soft, NT, limited by habitus Skin:  no rash or induration seen on limited exam Musculoskeletal:  grossly normal tone BUE/BLE, good ROM, no bony abnormality Psychiatric:  blunted mood and affect, speech fluent and appropriate, AOx3 Neurologic:  CN 2-12 grossly intact, moves all extremities in coordinated fashion  Data Reviewed: I have reviewed the patient's lab results since admission.  Pertinent labs for today include:   BUN 26/Creatinine 3.14/GFR 18 - stable AP 276 Albumin 3.0 AST 617/ALT 664/Bili 1.3 WBC  5.1 Hgb 10.4     Family Communication: None present  Disposition: Status is: Inpatient Remains inpatient appropriate because: surgical procedure today     Time spent: 35 minutes  Unresulted Labs (From admission, onward)     Start     Ordered   08/23/23 0500  CBC with Differential/Platelet  Tomorrow morning,   R         08/22/23 1545   08/21/23 0500  Basic metabolic panel  Daily,   R      08/20/23 0821   08/21/23 0500  Hepatic function panel  Daily,   R      08/20/23 1648             Author: Jonah Blue, MD 08/22/2023 3:46 PM  For on call review [email or web address].

## 2023-08-22 NOTE — Anesthesia Procedure Notes (Signed)
Procedure Name: Intubation Date/Time: 08/22/2023 7:45 AM  Performed by: Garth Bigness, CRNAPre-anesthesia Checklist: Patient identified, Emergency Drugs available, Suction available and Patient being monitored Patient Re-evaluated:Patient Re-evaluated prior to induction Oxygen Delivery Method: Circle system utilized Preoxygenation: Pre-oxygenation with 100% oxygen Induction Type: IV induction Ventilation: Mask ventilation without difficulty Laryngoscope Size: Mac and 3 Grade View: Grade II Tube type: Oral Tube size: 7.0 mm Number of attempts: 1 Airway Equipment and Method: Stylet Placement Confirmation: ETT inserted through vocal cords under direct vision, positive ETCO2 and breath sounds checked- equal and bilateral Secured at: 22 cm Tube secured with: Tape Dental Injury: Teeth and Oropharynx as per pre-operative assessment

## 2023-08-22 NOTE — Progress Notes (Signed)
Dr houser notified of pts blood pressure.  Ok to return to unit.

## 2023-08-22 NOTE — Plan of Care (Signed)
  Problem: Education: Goal: Knowledge of General Education information will improve Description: Including pain rating scale, medication(s)/side effects and non-pharmacologic comfort measures Outcome: Progressing   Problem: Pain Managment: Goal: General experience of comfort will improve and/or be controlled Outcome: Progressing   Problem: Skin Integrity: Goal: Risk for impaired skin integrity will decrease Outcome: Progressing

## 2023-08-22 NOTE — Progress Notes (Signed)
   08/22/23 0909  TOC Brief Assessment  Insurance and Status Lapsed  Patient has primary care physician Yes  Home environment has been reviewed Single family home  Prior level of function: Independent  Prior/Current Home Services No current home services  Social Drivers of Health Review SDOH reviewed no interventions necessary  Readmission risk has been reviewed Yes  Transition of care needs no transition of care needs at this time

## 2023-08-23 LAB — CBC WITH DIFFERENTIAL/PLATELET
Abs Immature Granulocytes: 0.04 10*3/uL (ref 0.00–0.07)
Basophils Absolute: 0 10*3/uL (ref 0.0–0.1)
Basophils Relative: 0 %
Eosinophils Absolute: 0 10*3/uL (ref 0.0–0.5)
Eosinophils Relative: 0 %
HCT: 35.4 % — ABNORMAL LOW (ref 36.0–46.0)
Hemoglobin: 10.8 g/dL — ABNORMAL LOW (ref 12.0–15.0)
Immature Granulocytes: 0 %
Lymphocytes Relative: 16 %
Lymphs Abs: 1.5 10*3/uL (ref 0.7–4.0)
MCH: 29.8 pg (ref 26.0–34.0)
MCHC: 30.5 g/dL (ref 30.0–36.0)
MCV: 97.8 fL (ref 80.0–100.0)
Monocytes Absolute: 0.5 10*3/uL (ref 0.1–1.0)
Monocytes Relative: 5 %
Neutro Abs: 7.1 10*3/uL (ref 1.7–7.7)
Neutrophils Relative %: 79 %
Platelets: 239 10*3/uL (ref 150–400)
RBC: 3.62 MIL/uL — ABNORMAL LOW (ref 3.87–5.11)
RDW: 14.6 % (ref 11.5–15.5)
WBC: 9.2 10*3/uL (ref 4.0–10.5)
nRBC: 0 % (ref 0.0–0.2)

## 2023-08-23 LAB — HEPATIC FUNCTION PANEL
ALT: 463 U/L — ABNORMAL HIGH (ref 0–44)
AST: 207 U/L — ABNORMAL HIGH (ref 15–41)
Albumin: 3.1 g/dL — ABNORMAL LOW (ref 3.5–5.0)
Alkaline Phosphatase: 260 U/L — ABNORMAL HIGH (ref 38–126)
Bilirubin, Direct: 0.2 mg/dL (ref 0.0–0.2)
Indirect Bilirubin: 0.4 mg/dL (ref 0.3–0.9)
Total Bilirubin: 0.6 mg/dL (ref 0.0–1.2)
Total Protein: 6.3 g/dL — ABNORMAL LOW (ref 6.5–8.1)

## 2023-08-23 LAB — LIPASE, BLOOD
Lipase: 3340 U/L — ABNORMAL HIGH (ref 11–51)
Lipase: 2072 U/L — ABNORMAL HIGH (ref 11–51)

## 2023-08-23 LAB — BASIC METABOLIC PANEL
Anion gap: 10 (ref 5–15)
BUN: 32 mg/dL — ABNORMAL HIGH (ref 6–20)
CO2: 19 mmol/L — ABNORMAL LOW (ref 22–32)
Calcium: 8.3 mg/dL — ABNORMAL LOW (ref 8.9–10.3)
Chloride: 110 mmol/L (ref 98–111)
Creatinine, Ser: 3.72 mg/dL — ABNORMAL HIGH (ref 0.44–1.00)
GFR, Estimated: 14 mL/min — ABNORMAL LOW (ref >60)
Glucose, Bld: 110 mg/dL — ABNORMAL HIGH (ref 70–99)
Potassium: 4.1 mmol/L (ref 3.5–5.1)
Sodium: 139 mmol/L (ref 135–145)

## 2023-08-23 MED ORDER — SODIUM CHLORIDE 0.9 % IV SOLN: INTRAVENOUS | Status: AC

## 2023-08-23 MED ORDER — MORPHINE SULFATE (PF) 2 MG/ML IV SOLN
1.0000 mg | INTRAVENOUS | Status: DC | PRN
  Administered 2023-08-23 (×2): 1 mg via INTRAVENOUS
  Filled 2023-08-23 (×2): qty 1

## 2023-08-23 MED ORDER — TRAMADOL HCL 50 MG PO TABS
25.0000 mg | ORAL_TABLET | Freq: Once | ORAL | Status: AC | PRN
  Administered 2023-08-23: 25 mg via ORAL
  Filled 2023-08-23: qty 1

## 2023-08-23 NOTE — Plan of Care (Signed)

## 2023-08-23 NOTE — Progress Notes (Addendum)
Earle Cefalu 11:49 AM  Subjective: Patient seen and examined and we discussed yesterday's procedure and she tolerated liquids yesterday was unable to eat solids and has increased pain today helped with a heating pad and we discussed possibly pancreatitis she has no other complaints  Objective: Vital signs stable afebrile no acute distress abdomen is soft somewhat tender no guarding or rebound BUN and creatinine about the same LFTs slight decrease  Assessment: Concerns for ERCP induced pancreatitis  Plan: Will put back on clear liquids today add a lipase from a.m. labs and repeat labs tomorrow and hopefully she will be feeling better and her diet can be advanced soon and will ask my partner Dr. Levora Angel to check on tomorrow and if she does have significantly elevated lipase and her pain is not helped by tramadol she may need something stronger  Ledon Weihe E  office [phone] After 5PM or if no answer call [phone]

## 2023-08-23 NOTE — Progress Notes (Signed)
PROGRESS NOTE    Dawn Thomas  WJX:[id number] DOB: [birth year] DOA: 08/18/2023 PCP: Daylene Katayama, PA   Brief Narrative:  [phone] with h/o morbid obesity s/p bariatric surgery, HLD, HTN, and hypothyroidism who presented on 1/18 with abdominal pain. She returned from a cruise to the [place] and had acute onset of pain on 1/4 with episodic pain since. CT on 1/16 (PTA) showed periportal and perihepatic edema and cystic ovarian enlargement. CXR on admission negative. Pelvic US with multiple ovarian cysts. RUQ without evidence of portal venous thrombosis and with evidence of medical renal disease. Marked LFT elevation, marked HTN. MRCP performed and showed choledocholithiasis,   ERCP 1/21 -worsening abdominal pain 01/22 -questionable post procedure pancreatitis, lipase pending, increase IV fluids/pain control.  Assessment & Plan:   Principal Problem:   Choledocholithiasis Active Problems:   Morbid obesity (HCC)   Hypothyroidism   Essential hypertension   Elevated liver enzymes   CKD (chronic kidney disease) stage 4, GFR 15-29 ml/min (HCC)   Ovarian cyst  Choledocholithiasis Elevated liver panel -Remote cholecystectomy -Elevated LFT, abnormal CT with perihepatic edema, right upper quadrant ultrasound unremarkable -Liver panel acutely elevated on the 21st from prior, now downtrending -Hepatitis panel negative -GI consulted status post MRCP and ERCP with reported success  Intractable abdominal pain and nausea -Initially improved post procedure, now worsening over the past 12 hours concern for postprocedural pancreatitis -Lipase pending, IV fluids, low-dose morphine ongoing    Stage 4 CKD Creatinine at baseline around 3 Hold spironolactone   Ovarian cyst Seen incidentally on CT Pelvic US with multiple ovarian cysts, nothing particularly concerning at this time   GERD (gastroesophageal reflux disease) Continue PPI   Essential hypertension Markedly elevated BP on  presentation Likely related to missed outpatient BP medications Continue hydralazine, nifedipine Hold spironlactone   HLD Hold atorvastatin in the setting of transaminitis   Hypothyroidism Continue Synthroid   Morbid obesity Body mass index is 42.77 kg/m..  S/p bariatric surgery and panniculectomy Ongoing weight loss/increased activity recommended  DVT prophylaxis: heparin injection 5,000 Units Start: 08/20/23 0915 SCDs Start: 08/19/23 1527   Code Status:   Code Status: Full Code  Family Communication: None present  Status is: Inpatient  Dispo: The patient is from: Home              Anticipated d/c is to: Home              Anticipated d/c date is: 24 to 48 hours              Patient currently not medically stable for discharge  Consultants:  GI  Procedures:  ERCP  Antimicrobials:  None  Subjective: No acute issues or events overnight, this morning patient worsening abdominal pain and nausea with p.o. intake  Objective: Vitals:   08/22/23 1416 08/22/23 2002 08/22/23 2200 08/23/23 0558  BP: (!) 142/66 (!) 161/70  (!) 159/68  Pulse:  63  73  Resp:  18  16  Temp:  99.5 F (37.5 C) 99.8 F (37.7 C) 98.9 F (37.2 C)  TempSrc:  Oral Oral   SpO2:  96%  95%  Weight:      Height:        Intake/Output Summary (Last 24 hours) at 08/23/2023 0718 Last data filed at 08/22/2023 2207 Gross per 24 hour  Intake 700 ml  Output 2 ml  Net 698 ml   Filed Weights   08/18/23 1921  Weight: 120.2 kg    Examination:  General exam:  Appears calm and comfortable  Respiratory system: Clear to auscultation. Respiratory effort normal. Cardiovascular system: S1 & S2 heard, RRR. No JVD, murmurs, rubs, gallops or clicks. No pedal edema. Gastrointestinal system: Abdomen is nondistended, soft and nontender. No organomegaly or masses felt. Normal bowel sounds heard. Central nervous system: Alert and oriented. No focal neurological deficits. Extremities: Symmetric 5 x 5  power. Skin: No rashes, lesions or ulcers Psychiatry: Judgement and insight appear normal. Mood & affect appropriate.     Data Reviewed: I have personally reviewed following labs and imaging studies  CBC: Recent Labs  Lab 08/19/23 0143 08/19/23 1623 08/20/23 0623 08/21/23 0446 08/22/23 0528 08/23/23 0528  WBC 6.5 6.5 5.3 7.1 5.1 9.2  NEUTROABS 4.0  --   --  5.1 3.1 7.1  HGB 10.2* 10.7* 10.3* 10.9* 10.4* 10.8*  HCT 32.2* 34.6* 34.0* 35.6* 33.9* 35.4*  MCV 93.1 96.9 98.6 96.2 97.1 97.8  PLT 227 248 228 239 225 239   Basic Metabolic Panel: Recent Labs  Lab 08/18/23 1924 08/19/23 1623 08/20/23 0623 08/21/23 0446 08/22/23 0528 08/23/23 0528  NA 139  --  138 139 139 139  K 4.3  --  4.6 4.5 4.6 4.1  CL 109  --  110 110 112* 110  CO2 21*  --  22 20* 21* 19*  GLUCOSE 97  --  95 112* 95 110*  BUN 34*  --  37* 31* 26* 32*  CREATININE 3.06* 3.21* 3.53* 3.02* 3.14* 3.72*  CALCIUM 7.9*  --  8.0* 8.1* 8.1* 8.3*   GFR: Estimated Creatinine Clearance: 24.7 mL/min (A) (by C-G formula based on SCr of 3.72 mg/dL (H)). Liver Function Tests: Recent Labs  Lab 08/18/23 1924 08/20/23 0623 08/21/23 0446 08/22/23 0528 08/23/23 0528  AST 479* 309* 345* 617* 207*  ALT 476* 380* 377* 664* 463*  ALKPHOS 214* 238* 266* 276* 260*  BILITOT 1.1 0.7 1.2 1.3* 0.6  PROT 6.8 6.4* 6.8 6.1* 6.3*  ALBUMIN 3.3* 3.1* 3.3* 3.0* 3.1*   Recent Labs  Lab 08/17/23 0119 08/18/23 1924  LIPASE 31 31   Coagulation Profile: Recent Labs  Lab 08/20/23 0623  INR 1.1   Anemia Panel: Recent Labs    08/20/23 1208  FERRITIN 110  TIBC 337  IRON 57   Sepsis Labs: Recent Labs  Lab 08/17/23 0119  LATICACIDVEN 1.7    No results found for this or any previous visit (from the past 240 hours).   Radiology Studies: DG ERCP Result Date: 08/22/2023 CLINICAL DATA:  Choledocholithiasis. EXAM: ERCP TECHNIQUE: Multiple spot images obtained with the fluoroscopic device and submitted for interpretation  post-procedure. COMPARISON:  MRI/MRCP on 08/20/2023 FINDINGS: Imaging with a C-arm during ERCP demonstrates placement of a pancreatic duct stent followed by cannulation of the common bile duct. Cholangiogram demonstrates possible filling defects. Balloon sweep maneuvers were performed to clear the duct of material. IMPRESSION: Balloon sweep maneuver to clear common bile duct of calculi during ERCP. These images were submitted for radiologic interpretation only. Please see the procedural report for the amount of contrast and the fluoroscopy time utilized. Electronically Signed   By: Irish Lack M.D.   On: 08/22/2023 13:24   DG C-Arm 1-60 Min-No Report Result Date: 08/22/2023 Fluoroscopy was utilized by the requesting physician.  No radiographic interpretation.    Scheduled Meds:  heparin injection (subcutaneous)  5,000 Units Subcutaneous Q8H   hydrALAZINE  50 mg Oral Q8H   levothyroxine  125 mcg Oral Q0600   NIFEdipine  60 mg Oral  BID   pantoprazole  40 mg Oral BID   sodium chloride flush  3 mL Intravenous Q12H   Continuous Infusions:   LOS: 2 days   Time spent:  Azucena Fallen, DO [facility]  If 7PM-7AM, please contact night-coverage [email or web address]  08/23/2023, 7:18 AM

## 2023-08-24 LAB — CBC WITH DIFFERENTIAL/PLATELET
Abs Immature Granulocytes: 0.03 10*3/uL (ref 0.00–0.07)
Basophils Absolute: 0 10*3/uL (ref 0.0–0.1)
Basophils Relative: 0 %
Eosinophils Absolute: 0.1 10*3/uL (ref 0.0–0.5)
Eosinophils Relative: 1 %
HCT: 31.6 % — ABNORMAL LOW (ref 36.0–46.0)
Hemoglobin: 9.8 g/dL — ABNORMAL LOW (ref 12.0–15.0)
Immature Granulocytes: 0 %
Lymphocytes Relative: 20 %
Lymphs Abs: 1.7 10*3/uL (ref 0.7–4.0)
MCH: 30 pg (ref 26.0–34.0)
MCHC: 31 g/dL (ref 30.0–36.0)
MCV: 96.6 fL (ref 80.0–100.0)
Monocytes Absolute: 0.5 10*3/uL (ref 0.1–1.0)
Monocytes Relative: 5 %
Neutro Abs: 6.4 10*3/uL (ref 1.7–7.7)
Neutrophils Relative %: 74 %
Platelets: 208 10*3/uL (ref 150–400)
RBC: 3.27 MIL/uL — ABNORMAL LOW (ref 3.87–5.11)
RDW: 14.5 % (ref 11.5–15.5)
WBC: 8.7 10*3/uL (ref 4.0–10.5)
nRBC: 0 % (ref 0.0–0.2)

## 2023-08-24 LAB — COMPREHENSIVE METABOLIC PANEL
ALT: 298 U/L — ABNORMAL HIGH (ref 0–44)
AST: 87 U/L — ABNORMAL HIGH (ref 15–41)
Albumin: 3 g/dL — ABNORMAL LOW (ref 3.5–5.0)
Alkaline Phosphatase: 221 U/L — ABNORMAL HIGH (ref 38–126)
Anion gap: 10 (ref 5–15)
BUN: 29 mg/dL — ABNORMAL HIGH (ref 6–20)
CO2: 20 mmol/L — ABNORMAL LOW (ref 22–32)
Calcium: 8.1 mg/dL — ABNORMAL LOW (ref 8.9–10.3)
Chloride: 110 mmol/L (ref 98–111)
Creatinine, Ser: 3.01 mg/dL — ABNORMAL HIGH (ref 0.44–1.00)
GFR, Estimated: 19 mL/min — ABNORMAL LOW (ref >60)
Glucose, Bld: 97 mg/dL (ref 70–99)
Potassium: 4 mmol/L (ref 3.5–5.1)
Sodium: 140 mmol/L (ref 135–145)
Total Bilirubin: 0.8 mg/dL (ref 0.0–1.2)
Total Protein: 6.4 g/dL — ABNORMAL LOW (ref 6.5–8.1)

## 2023-08-24 LAB — LIPASE, BLOOD: Lipase: 326 U/L — ABNORMAL HIGH (ref 11–51)

## 2023-08-24 MED ORDER — ONDANSETRON HCL 4 MG PO TABS: 4.0000 mg | ORAL_TABLET | Freq: Three times a day (TID) | ORAL | 0 refills | Status: AC | PRN

## 2023-08-24 MED ORDER — ATORVASTATIN CALCIUM 10 MG PO TABS: 10.0000 mg | ORAL_TABLET | Freq: Every day | ORAL | 0 refills | Status: AC

## 2023-08-24 NOTE — Plan of Care (Signed)

## 2023-08-24 NOTE — Discharge Summary (Signed)
Physician Discharge Summary  Dawn Thomas QMV:[id number] DOB: June 23, 1976 DOA: 08/18/2023  PCP: Daylene Katayama, PA  Admit date: 08/18/2023 Discharge date: 08/24/2023  Admitted From: Home Disposition: Home  Recommendations for Outpatient Follow-up:  Follow up with PCP in 1-2 weeks Follow-up with GI as scheduled  Home Health: None Equipment/Devices: None  Discharge Condition: Stable CODE STATUS: Full Diet recommendation: Low-salt low-fat soft diet, advance back to regular diet over the next 1 to 2 weeks as discussed  Brief/Interim Summary: 47yo with h/o morbid obesity s/p bariatric surgery, HLD, HTN, and hypothyroidism who presented on 1/18 with abdominal pain. She returned from a cruise to the [place] and had acute onset of pain on 1/4 with episodic pain since. CT on 1/16 (PTA) showed periportal and perihepatic edema and cystic ovarian enlargement. CXR on admission negative. Pelvic US with multiple ovarian cysts. RUQ without evidence of portal venous thrombosis and with evidence of medical renal disease. Marked LFT elevation, marked HTN. MRCP performed and showed choledocholithiasis, ERCP 1/21 successful.  Worsening abdominal pain and nausea on 1/22 concern for postprocedural pancreatitis with elevated lipase now downtrending.  She is now tolerating p.o. quite well this morning, otherwise stable and agreeable for discharge home.  Discharge Diagnoses:  Principal Problem:   Choledocholithiasis Active Problems:   Morbid obesity (HCC)   Hypothyroidism   Essential hypertension   Elevated liver enzymes   CKD (chronic kidney disease) stage 4, GFR 15-29 ml/min (HCC)   Ovarian cyst  Choledocholithiasis Elevated liver panel -Remote cholecystectomy -Elevated LFT, abnormal CT with perihepatic edema, right upper quadrant ultrasound unremarkable -Liver panel acutely continues to downtrend -follow-up with PCP in the next few weeks to ensure downtrending continues -Hepatitis  panel negative -GI consulted status post MRCP and ERCP with reported success   Intractable abdominal pain and nausea Postprocedural pancreatitis -Lipase elevated as expected post-procedure but downtrending    Stage 4 CKD Creatinine at baseline around 3 Resume spironolactone   Ovarian cyst Seen incidentally on CT Pelvic US with multiple ovarian cysts, nothing particularly concerning at this time   GERD (gastroesophageal reflux disease) Continue PPI   Essential hypertension Markedly elevated BP on presentation Likely related to missed outpatient BP medications Continue hydralazine, nifedipine Hold spironlactone   HLD Hold atorvastatin in the setting of transaminitis   Hypothyroidism Continue Synthroid   Morbid obesity Body mass index is 42.77 kg/m..  S/p bariatric surgery and panniculectomy Ongoing weight loss/increased activity recommended  Discharge Instructions  Discharge Instructions     Call MD for:  difficulty breathing, headache or visual disturbances   Complete by: As directed    Call MD for:  extreme fatigue   Complete by: As directed    Call MD for:  hives   Complete by: As directed    Call MD for:  persistant dizziness or light-headedness   Complete by: As directed    Call MD for:  persistant nausea and vomiting   Complete by: As directed    Call MD for:  severe uncontrolled pain   Complete by: As directed    Call MD for:  temperature >100.4   Complete by: As directed    Diet - low sodium heart healthy   Complete by: As directed    Increase activity slowly   Complete by: As directed       Allergies as of 08/24/2023       Reactions   Maxalt [rizatriptan Benzoate] Shortness Of Breath   Wound Dressing Adhesive Dermatitis   Other Reaction(s):  Other (See Comments) Burns skin   Zithromax [azithromycin] Other (See Comments)   "messes with my breathing"   Adhesive [tape] Other (See Comments)   Burns skin   Hydrocodone Hives   Nsaids Other (See  Comments)   Renal insufficiency. Other Reaction(s): Other (See Comments) Cannot take with Renal Insufficiency., Other reaction(s): Other (See Comments), Renal insufficiency., Renal insufficiency.   Omeprazole Hives   Penicillins Hives        Medication List     TAKE these medications    acetaminophen 500 MG tablet Commonly known as: TYLENOL Take 1,000 mg by mouth every 6 (six) hours as needed for mild pain.   albuterol 108 (90 Base) MCG/ACT inhaler Commonly known as: VENTOLIN HFA Inhale 2 puffs into the lungs every 6 (six) hours as needed for wheezing or shortness of breath.   atorvastatin 10 MG tablet Commonly known as: LIPITOR Take 1 tablet (10 mg total) by mouth daily. Start taking on: September 01, 2023 What changed: These instructions start on September 01, 2023. If you are unsure what to do until then, ask your doctor or other care provider.   ergocalciferol 1.25 MG (50000 UT) capsule Commonly known as: VITAMIN D2 Take 50,000 Units by mouth once a week.   hydrALAZINE 50 MG tablet Commonly known as: APRESOLINE Take 1 tablet (50 mg total) by mouth every 8 (eight) hours.   levothyroxine 125 MCG tablet Commonly known as: SYNTHROID Take 125 mcg by mouth daily.   NIFEdipine 60 MG 24 hr tablet Commonly known as: ADALAT CC Take 60 mg by mouth 2 (two) times daily.   ondansetron 4 MG tablet Commonly known as: Zofran Take 1 tablet (4 mg total) by mouth every 8 (eight) hours as needed for up to 12 days for nausea or vomiting.   pantoprazole 40 MG tablet Commonly known as: PROTONIX Take 40 mg by mouth 2 (two) times daily.   spironolactone 25 MG tablet Commonly known as: ALDACTONE Take 25 mg by mouth daily.        Allergies  Allergen Reactions   Maxalt [Rizatriptan Benzoate] Shortness Of Breath   Wound Dressing Adhesive Dermatitis    Other Reaction(s): Other (See Comments)  Burns skin   Zithromax [Azithromycin] Other (See Comments)    "messes with my  breathing"   Adhesive [Tape] Other (See Comments)    Burns skin   Hydrocodone Hives   Nsaids Other (See Comments)    Renal insufficiency.  Other Reaction(s): Other (See Comments)  Cannot take with Renal Insufficiency., Other reaction(s): Other (See Comments), Renal insufficiency., Renal insufficiency.   Omeprazole Hives   Penicillins Hives    Consultations: GI   Procedures/Studies: DG ERCP Result Date: 08/22/2023 CLINICAL DATA:  Choledocholithiasis. EXAM: ERCP TECHNIQUE: Multiple spot images obtained with the fluoroscopic device and submitted for interpretation post-procedure. COMPARISON:  MRI/MRCP on 08/20/2023 FINDINGS: Imaging with a C-arm during ERCP demonstrates placement of a pancreatic duct stent followed by cannulation of the common bile duct. Cholangiogram demonstrates possible filling defects. Balloon sweep maneuvers were performed to clear the duct of material. IMPRESSION: Balloon sweep maneuver to clear common bile duct of calculi during ERCP. These images were submitted for radiologic interpretation only. Please see the procedural report for the amount of contrast and the fluoroscopy time utilized. Electronically Signed   By: Irish Lack M.D.   On: 08/22/2023 13:24   DG C-Arm 1-60 Min-No Report Result Date: 08/22/2023 Fluoroscopy was utilized by the requesting physician.  No radiographic interpretation.   MR ABDOMEN  MRCP W WO CONTAST Result Date: 08/21/2023 CLINICAL DATA:  Right upper quadrant pain. Prior cholecystectomy. Suspected biliary disease. EXAM: MRI ABDOMEN WITHOUT AND WITH CONTRAST (INCLUDING MRCP) TECHNIQUE: Multiplanar multisequence MR imaging of the abdomen was performed both before and after the administration of intravenous contrast. Heavily T2-weighted images of the biliary and pancreatic ducts were obtained, and three-dimensional MRCP images were rendered by post processing. CONTRAST:  10mL GADAVIST GADOBUTROL 1 MMOL/ML IV SOLN COMPARISON:  None Available.  FINDINGS: Lower chest: No acute findings. Hepatobiliary: No hepatic masses identified. No MR signs of steatosis. Prior cholecystectomy. No No evidence of biliary ductal dilatation, with common bile duct measuring 7 mm in diameter. Two Approximately 5 mm calculi are seen in the distal common bile duct. Pancreas:  No mass or inflammatory changes. Spleen:  Within normal limits in size and appearance. Adrenals/Urinary Tract: No suspicious masses identified. No evidence of hydronephrosis. Stomach/Bowel: Small hiatal hernia is seen.  Otherwise unremarkable. Vascular/Lymphatic: No pathologically enlarged lymph nodes identified. No acute vascular findings. Other:  None. Musculoskeletal:  No suspicious bone lesions identified. IMPRESSION: Prior cholecystectomy. Choledocholithiasis, with 2 approximately 5 mm calculi in the distal common bile duct. No evidence of biliary ductal dilatation. Small hiatal hernia. Electronically Signed   By: Danae Orleans M.D.   On: 08/21/2023 08:24   US ABDOMEN LIMITED WITH LIVER DOPPLER Result Date: 08/20/2023 CLINICAL DATA:  Portal vein thrombosis suspected EXAM: DUPLEX ULTRASOUND OF LIVER TECHNIQUE: Color and duplex Doppler ultrasound was performed to evaluate the hepatic in-flow and out-flow vessels. COMPARISON:  None Available. FINDINGS: Liver: Normal parenchymal echogenicity. Normal hepatic contour without nodularity. No focal lesion, mass or intrahepatic biliary ductal dilatation. The gallbladder is surgically absent. Main Portal Vein size: 1.3 cm Portal Vein Velocities Main Prox:  27 cm/sec Main Mid: 45 cm/sec Main Dist:  29 cm/sec Right: 27 cm/sec Left: 63 cm/sec Hepatic Vein Velocities Right:  33 cm/sec Middle:  26 cm/sec Left:  18 cm/sec IVC: Present and patent with normal respiratory phasicity. Hepatic Artery Velocity:  71 cm/sec Splenic Vein Velocity:  16 cm/sec Spleen: 10.1 cm x 5.6 cm x 9.3 cm with a total volume of 274 cm^3 (411 cm^3 is upper limit normal) Portal Vein  Occlusion/Thrombus: No Splenic Vein Occlusion/Thrombus: No Ascites: None Varices: None Other: Increased echogenicity of the right kidney noted incidentally. IMPRESSION: 1. No evidence of portal venous thrombosis. 2. Normal portal and hepatic vein velocities and flow direction. 3. Significantly increased echogenicity of the renal parenchyma suggests underlying medical renal disease. 4. The gallbladder is surgically absent. Electronically Signed   By: Malachy Moan M.D.   On: 08/20/2023 05:47   US PELVIC COMPLETE WITH TRANSVAGINAL Result Date: 08/19/2023 CLINICAL DATA:  Ovarian cyst EXAM: TRANSABDOMINAL AND TRANSVAGINAL ULTRASOUND OF PELVIS TECHNIQUE: Both transabdominal and transvaginal ultrasound examinations of the pelvis were performed. Transabdominal technique was performed for global imaging of the pelvis including uterus, ovaries, adnexal regions, and pelvic cul-de-sac. It was necessary to proceed with endovaginal exam following the transabdominal exam to visualize the adnexal structures. COMPARISON:  08/17/2023 FINDINGS: Uterus Measurements: 9.5 x 5.2 x 5.7 cm = volume: 146.9 mL. No fibroids or other mass visualized. Endometrium Thickness: 6 mm.  No focal abnormality visualized. Right ovary Measurements: 3.3 x 2.5 x 2.6 cm = volume: 11.2 mL. There is a 2.2 x 0.9 x 0.7 cm dominant follicle within the right ovary. No other adnexal masses. Left ovary Measurements: 4.6 x 3.5 x 4.8 cm = volume: 41.0 mL. There are 2 contiguous simple cysts  within the left ovary, measuring 3.4 x 3.4 x 3.6 cm and 2.1 x 2.5 x 2.2 cm. No other adnexal masses. Other findings No abnormal free fluid. Endovaginal examination is limited due to patient body habitus. IMPRESSION: 1. Multiple ovarian cysts. Most significant: Left ovarian benign functional cyst measuring 3.6 cm, if patient is premenopausal (probable benign cyst if patient is postmenopausal). If patient is premenopausal, no follow-up recommended. If patient is  postmenopausal, recommend follow-up pelvic ultrasound in 3-6 months. Reference: Radiology 2019 Nov;293(2):359-371 2. Otherwise unremarkable exam. Electronically Signed   By: Sharlet Salina M.D.   On: 08/19/2023 19:43   DG Chest 2 View Result Date: 08/19/2023 CLINICAL DATA:  Intermittent upper abdominal pain EXAM: CHEST - 2 VIEW COMPARISON:  08/22/2020 FINDINGS: Stable cardiomediastinal silhouette. No focal consolidation, pleural effusion, or pneumothorax. No displaced rib fractures. IMPRESSION: No active cardiopulmonary disease. Electronically Signed   By: Minerva Fester M.D.   On: 08/19/2023 18:44   CT ABDOMEN PELVIS WO CONTRAST Result Date: 08/17/2023 CLINICAL DATA:  Acute, nonlocalized abdominal pain EXAM: CT ABDOMEN AND PELVIS WITHOUT CONTRAST TECHNIQUE: Multidetector CT imaging of the abdomen and pelvis was performed following the standard protocol without IV contrast. RADIATION DOSE REDUCTION: This exam was performed according to the departmental dose-optimization program which includes automated exposure control, adjustment of the mA and/or kV according to patient size and/or use of iterative reconstruction technique. COMPARISON:  12/11/2017 FINDINGS: Lower chest: Coronary atherosclerosis seen at the RCA. Mild scarring or atelectasis in the lower lungs. Generous heart size. Hepatobiliary: No focal liver abnormality.Periportal low-density may reflect edema. There is rounding of the infrahepatic cava. Hazy density in the gallbladder fossa attributed to edema. Remote cholecystectomy with unremarkable gallbladder fossa on prior. Pancreas: Unremarkable. Spleen: Unremarkable. Adrenals/Urinary Tract: Negative adrenals. No hydronephrosis or stone. History of renal insufficiency. Bilateral renal cortical thinning and lobulation. Unremarkable bladder. Stomach/Bowel: Moderate stool without impaction or obstructive changes. No bowel wall thickening. Postoperative stomach. Vascular/Lymphatic: No acute vascular  abnormality. No mass or adenopathy. Reproductive:Interval cystic density enlargement the left ovary measuring up to 5.2 cm anterior to posterior on sagittal reformats, internal architecture possibly septated but limited without contrast. Other: No ascites or pneumoperitoneum. Musculoskeletal: No acute abnormalities. L5-S1 disc narrowing and endplate degeneration with ridging. Moderate foraminal narrowing on the left. IMPRESSION: 1. Periportal/perihepatic edema, possibly passive congestion, correlate with liver function tests. 2. Cystic enlargement of the left ovary since 2019, up to 5.2 cm. Because this lesion is not adequately characterized, prompt [place] is recommended for further evaluation. Note: This recommendation does not apply to premenarchal patients and to those with increased risk (genetic, family history, elevated tumor markers or other high-risk factors) of ovarian cancer. Reference: JACR 2020 Feb; 17(2):248-254 Electronically Signed   By: Tiburcio Pea M.D.   On: 08/17/2023 05:22     Subjective: No acute issues or events overnight tolerating p.o. well today denies nausea vomiting diarrhea constipation headache fevers chills chest pain shortness of breath   Discharge Exam: Vitals:   08/23/23 2027 08/24/23 0533  BP: (!) 155/79 (!) 155/73  Pulse: 79 81  Resp: 18 18  Temp: 98.4 F (36.9 C) 98.7 F (37.1 C)  SpO2: 95% 96%   Vitals:   08/23/23 1332 08/23/23 1410 08/23/23 2027 08/24/23 0533  BP: (!) 173/78 (!) 173/78 (!) 155/79 (!) 155/73  Pulse: 65  79 81  Resp: 18  18 18   Temp: 99.6 F (37.6 C)  98.4 F (36.9 C) 98.7 F (37.1 C)  TempSrc: Oral  Oral Oral  SpO2: 98%  95% 96%  Weight:      Height:        General: Pt is alert, awake, not in acute distress Cardiovascular: RRR, S1/S2 +, no rubs, no gallops Respiratory: CTA bilaterally, no wheezing, no rhonchi Abdominal: Soft, NT, ND, bowel sounds + Extremities: no edema, no cyanosis    The results of significant diagnostics  from this hospitalization (including imaging, microbiology, ancillary and laboratory) are listed below for reference.     Microbiology: No results found for this or any previous visit (from the past 240 hours).   Labs: BNP (last 3 results) No results for input(s): "BNP" in the last 8760 hours. Basic Metabolic Panel: Recent Labs  Lab 08/20/23 0623 08/21/23 0446 08/22/23 0528 08/23/23 0528 08/24/23 0520  NA 138 139 139 139 140  K 4.6 4.5 4.6 4.1 4.0  CL 110 110 112* 110 110  CO2 22 20* 21* 19* 20*  GLUCOSE 95 112* 95 110* 97  BUN 37* 31* 26* 32* 29*  CREATININE 3.53* 3.02* 3.14* 3.72* 3.01*  CALCIUM 8.0* 8.1* 8.1* 8.3* 8.1*   Liver Function Tests: Recent Labs  Lab 08/20/23 0623 08/21/23 0446 08/22/23 0528 08/23/23 0528 08/24/23 0520  AST 309* 345* 617* 207* 87*  ALT 380* 377* 664* 463* 298*  ALKPHOS 238* 266* 276* 260* 221*  BILITOT 0.7 1.2 1.3* 0.6 0.8  PROT 6.4* 6.8 6.1* 6.3* 6.4*  ALBUMIN 3.1* 3.3* 3.0* 3.1* 3.0*   Recent Labs  Lab 08/18/23 1924 08/23/23 0523 08/23/23 1438 08/24/23 0520  LIPASE 31 3,340* 2,072* 326*   No results for input(s): "AMMONIA" in the last 168 hours. CBC: Recent Labs  Lab 08/19/23 0143 08/19/23 1623 08/20/23 1610 08/21/23 0446 08/22/23 0528 08/23/23 0528 08/24/23 0520  WBC 6.5   < > 5.3 7.1 5.1 9.2 8.7  NEUTROABS 4.0  --   --  5.1 3.1 7.1 6.4  HGB 10.2*   < > 10.3* 10.9* 10.4* 10.8* 9.8*  HCT 32.2*   < > 34.0* 35.6* 33.9* 35.4* 31.6*  MCV 93.1   < > 98.6 96.2 97.1 97.8 96.6  PLT 227   < > 228 239 225 239 208   < > = values in this interval not displayed.   Cardiac Enzymes: No results for input(s): "CKTOTAL", "CKMB", "CKMBINDEX", "TROPONINI" in the last 168 hours. BNP: Invalid input(s): "POCBNP" CBG: No results for input(s): "GLUCAP" in the last 168 hours. D-Dimer No results for input(s): "DDIMER" in the last 72 hours. Hgb A1c No results for input(s): "HGBA1C" in the last 72 hours. Lipid Profile No results for  input(s): "CHOL", "HDL", "LDLCALC", "TRIG", "CHOLHDL", "LDLDIRECT" in the last 72 hours. Thyroid function studies No results for input(s): "TSH", "T4TOTAL", "T3FREE", "THYROIDAB" in the last 72 hours.  Invalid input(s): "FREET3" Anemia work up No results for input(s): "VITAMINB12", "FOLATE", "FERRITIN", "TIBC", "IRON", "RETICCTPCT" in the last 72 hours. Urinalysis    Component Value Date/Time   COLORURINE YELLOW 08/18/2023 0213   APPEARANCEUR CLEAR 08/18/2023 0213   LABSPEC 1.020 08/18/2023 0213   PHURINE 6.0 08/18/2023 0213   GLUCOSEU NEGATIVE 08/18/2023 0213   HGBUR LARGE (A) 08/18/2023 0213   BILIRUBINUR NEGATIVE 08/18/2023 0213   KETONESUR NEGATIVE 08/18/2023 0213   PROTEINUR >=300 (A) 08/18/2023 0213   UROBILINOGEN 1.0 10/04/2013 2130   NITRITE NEGATIVE 08/18/2023 0213   LEUKOCYTESUR NEGATIVE 08/18/2023 0213   Sepsis Labs Recent Labs  Lab 08/21/23 0446 08/22/23 0528 08/23/23 0528 08/24/23 0520  WBC 7.1 5.1 9.2 8.7  Microbiology No results found for this or any previous visit (from the past 240 hours).   Time coordinating discharge: Over 30 minutes  SIGNED:   Azucena Fallen, DO [facility] 08/24/2023, 11:57 AM Pager   If 7PM-7AM, please contact night-coverage [email or web address]

## 2023-08-24 NOTE — Plan of Care (Signed)

## 2023-08-24 NOTE — Progress Notes (Signed)
Dawn Thomas 9:15 AM  Subjective: Patient doing better today and tolerated clear liquids and wants to try soft solids for lunch and then if she does well hopefully can go home soon has no new complaints  Objective: Vital signs stable afebrile no acute distress abdomen is soft essentially nontender much improved slight decreased BUN and creatinine lipase decreased liver test decreased CBC okay  Assessment: Seemingly resolving post ERCP pancreatitis status post stone removal  Plan: Okay with me to go home if she can tolerate diet and ambulation and has a follow-up with her primary to recheck labs and of asked her to send me a copy and happy to see back as needed  [facility] E  office [phone] After 5PM or if no answer call [phone]

## 2023-08-25 ENCOUNTER — Encounter (HOSPITAL_COMMUNITY): Payer: Self-pay | Admitting: Gastroenterology

## 2024-01-05 ENCOUNTER — Encounter (HOSPITAL_COMMUNITY): Payer: Self-pay | Admitting: *Deleted
